# Patient Record
Sex: Male | Born: 1950 | Race: White | Hispanic: No | Smoking: Former smoker
Health system: Southern US, Community
[De-identification: ages and names within clinical notes are randomized; demographics above are authoritative.]

## PROBLEM LIST (undated history)

## (undated) DIAGNOSIS — E785 Hyperlipidemia, unspecified: Secondary | ICD-10-CM

## (undated) DIAGNOSIS — I7789 Other specified disorders of arteries and arterioles: Secondary | ICD-10-CM

## (undated) DIAGNOSIS — I10 Essential (primary) hypertension: Secondary | ICD-10-CM

## (undated) DIAGNOSIS — I77810 Thoracic aortic ectasia: Secondary | ICD-10-CM

## (undated) DIAGNOSIS — Z8249 Family history of ischemic heart disease and other diseases of the circulatory system: Secondary | ICD-10-CM

## (undated) DIAGNOSIS — I7121 Aneurysm of the ascending aorta, without rupture: Secondary | ICD-10-CM

## (undated) DIAGNOSIS — I712 Thoracic aortic aneurysm, without rupture: Secondary | ICD-10-CM

## (undated) HISTORY — DX: Aneurysm of the ascending aorta, without rupture: I71.21

## (undated) HISTORY — DX: Essential (primary) hypertension: I10

## (undated) HISTORY — DX: Hyperlipidemia, unspecified: E78.5

## (undated) HISTORY — PX: COLONOSCOPY: SHX174

## (undated) HISTORY — DX: Other specified disorders of arteries and arterioles: I77.89

## (undated) HISTORY — DX: Thoracic aortic ectasia: I77.810

## (undated) HISTORY — DX: Hemochromatosis, unspecified: E83.119

## (undated) HISTORY — DX: Thoracic aortic aneurysm, without rupture: I71.2

## (undated) HISTORY — DX: Family history of ischemic heart disease and other diseases of the circulatory system: Z82.49

---

## 2003-10-15 ENCOUNTER — Inpatient Hospital Stay (HOSPITAL_COMMUNITY): Admission: EM | Admit: 2003-10-15 | Discharge: 2003-10-16 | Payer: Self-pay | Admitting: Emergency Medicine

## 2014-01-14 ENCOUNTER — Other Ambulatory Visit: Payer: Self-pay | Admitting: Gastroenterology

## 2014-01-14 DIAGNOSIS — R1032 Left lower quadrant pain: Secondary | ICD-10-CM

## 2014-01-16 ENCOUNTER — Ambulatory Visit
Admission: RE | Admit: 2014-01-16 | Discharge: 2014-01-16 | Disposition: A | Discharge status: Home / Self Care | Payer: BC Managed Care – PPO | Source: Ambulatory Visit | Attending: Gastroenterology | Admitting: Gastroenterology

## 2014-01-16 ENCOUNTER — Encounter (INDEPENDENT_AMBULATORY_CARE_PROVIDER_SITE_OTHER): Payer: Self-pay

## 2014-01-16 DIAGNOSIS — R1032 Left lower quadrant pain: Secondary | ICD-10-CM

## 2014-01-16 MED ORDER — IOHEXOL 300 MG/ML  SOLN
100.0000 mL | Freq: Once | INTRAMUSCULAR | Status: AC | PRN
  Administered 2014-01-16: 100 mL via INTRAVENOUS

## 2014-02-19 ENCOUNTER — Other Ambulatory Visit (HOSPITAL_COMMUNITY): Payer: Self-pay

## 2014-03-11 ENCOUNTER — Other Ambulatory Visit: Payer: Self-pay | Admitting: General Surgery

## 2014-03-11 DIAGNOSIS — I77810 Thoracic aortic ectasia: Secondary | ICD-10-CM

## 2014-03-12 ENCOUNTER — Encounter: Payer: Self-pay | Admitting: Oncology

## 2014-03-12 ENCOUNTER — Ambulatory Visit (HOSPITAL_COMMUNITY): Payer: BC Managed Care – PPO | Attending: Cardiovascular Disease | Admitting: Cardiology

## 2014-03-12 ENCOUNTER — Ambulatory Visit (INDEPENDENT_AMBULATORY_CARE_PROVIDER_SITE_OTHER): Payer: BC Managed Care – PPO | Admitting: Oncology

## 2014-03-12 DIAGNOSIS — I517 Cardiomegaly: Secondary | ICD-10-CM | POA: Insufficient documentation

## 2014-03-12 DIAGNOSIS — I77810 Thoracic aortic ectasia: Secondary | ICD-10-CM

## 2014-03-12 DIAGNOSIS — I079 Rheumatic tricuspid valve disease, unspecified: Secondary | ICD-10-CM | POA: Insufficient documentation

## 2014-03-12 DIAGNOSIS — R161 Splenomegaly, not elsewhere classified: Secondary | ICD-10-CM

## 2014-03-12 HISTORY — DX: Hemochromatosis, unspecified: E83.119

## 2014-03-12 NOTE — Progress Notes (Signed)
Patient ID: Alexander Pollard, male   DOB: 01/08/1951, 63 y.o.   MRN: [id number] New Patient Hematology   TRAYVEON BECKFORD [id number] 1951/09/06 63 y.o. 03/12/2014  CC: Dr Keturah Barre. Dorthy Cooler; Dr. 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 At time of annual physical exam by his primary care physician, laboratory studies showed mild liver function abnormalities which led to further study including an iron panel done on 12/25/2013 which showed serum iron 264, percent saturation 97. Ferritin done on April 22:  1498. 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 No mutations in the other minor hemochromatosis genes. Bilirubin 1.1 (0.3-1.0, SGOT 40 (0-39), SGPT 78 (0-52), alkaline phosphatase 71. CT scan of the abdomen 01/16/2014 with a prominent spleen measuring 16 cm. Liver overall appeared normal. There were 3 "tiny low attenuation lesions superiorly too small to characterize. These may represent tiny cysts."  He has no history of diabetes, heart disease, prior known liver disease and specifically denies history of hepatitis, yellow jaundice, malaria, mononucleosis. No known gonadal dysfunction. 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 He drinks an average of 6 beers per week.  No one in his family has been tested or diagnosed with hemachromatosis. His mother died at age 3 of an MI. Father died of old age in his 16s. He had 6 brothers 4 are deceased. 4 of his brothers had coronary disease and had bypass surgery. One died in a motor vehicle accident. One died of complications of lymphoma. One died recently at age 44 of alcohol related cirrhosis. He has a daughter age 73 and a son age 70 who are  healthy.   PMH: Past Medical History  Diagnosis Date  . Hemochromatosis 03/12/2014    C282Y homozygote dx 01/02/14  Ferritin 1498  Iron saturation 97%  See history of present illness. No hypertension, diabetes, stomach ulcers, asthma, emphysema, no history of tuberculosis, no thyroid trouble, no history of seizure or stroke. No prostate problems.  No prior surgery  Allergies: Allergies  Allergen Reactions  . Effexor [Venlafaxine]     FATIGUE SIDE EFFECTS    Medications: No chronic medications    Social History:  See history of present illness. Nonsmoker. Occasional beer less than 6 per week  Family History: See history of present illness Family History  Problem Relation Age of Onset  . Heart attack Mother   . Heart disease Mother   . Leukemia Brother   . Lymphoma Brother   . Heart disease Brother     Review of Systems: Hematology: Positive easy bruising,  ENT ROS: negative for - oral lesions or sore throat Breast ROS:  Respiratory ROS: negative for - cough, pleuritic pain, shortness of breath or wheezing Cardiovascular ROS: negative for - chest pain, dyspnea on exertion, edema, irregular heartbeat, murmur,  palpitations,  or rapid heart rate Gastrointestinal ROS- he has had some intermittent left upper quadrant discomfort. No appetite loss, blood in stools, change in bowel habits, constipation, diarrhea, heartburn, hematemesis, melena, nausea/vomiting or swallowing difficulty/pain Genito-Urinary ROS: No prostate trouble Musculoskeletal ROS: negative for - joint pain, joint stiffness, joint swelling, muscle pain, muscular weakness or pain except for arthritis which has developed in his fingers over the last few years Neurological ROS: negative for headache, change in  vision, or paresthesias. No history of stroke or seizure. Dermatological ROS: Positive for ecchymosis/easy bruising Remaining ROS negative.  Physical Exam: Blood pressure 110/80, pulse 71, temperature 97.4  F (36.3 C), temperature source Oral, height 5\' 7"  (1.702 m), weight 188 lb 9.6 oz (85.548 kg), SpO2 99.00%. Wt Readings from Last 3 Encounters:  03/12/14 188 lb 9.6 oz (85.548 kg)     General appearance: Well-nourished Caucasian man HENNT: Pharynx no erythema, exudate, mass, or ulcer. No thyromegaly or thyroid nodules Lymph nodes: No cervical, supraclavicular, or axillary lymphadenopathy Breasts: Lungs: Clear to auscultation, resonant to percussion throughout Heart: Regular rhythm, no murmur, no gallop, no rub, no click, no edema Abdomen: Soft, nontender, normal bowel sounds, no mass, no organomegaly despite splenomegaly by CT scan criteria Extremities: No edema, no calf tenderness Musculoskeletal: no joint deformities GU: Not examined Vascular: Carotid pulses 2+, no bruits, distal pulses: Dorsalis pedis 1+ symmetric Neurologic: Alert, oriented, PERRLA, optic discs sharp and vessels normal, no hemorrhage or exudate, cranial nerves grossly normal, motor strength 5 over 5, reflexes 1+ symmetric, upper body coordination normal, gait normal, vibration sensation is normal by tuning fork exam over the fingertips Skin: Scattered small ecchymosis on the forearms. He is tanned from being at the beach.    Lab Results: No results found for this basename: WBC, HGB, HCT, MCV, PLT     Chemistry   No results found for this basename: NA, K, CL, CO2, BUN, CREATININE, GLU   No results found for this basename: CALCIUM, ALKPHOS, AST, ALT, BILITOT      Radiological Studies: See discussion above CT-scan of the abdomen 01/16/14 There are 3 tiny low-attenuation lesions in the liver superiorly  small too small to characterize, 2 in the left lobe and 1 in the  right. These may represent tiny cysts. The liver is otherwise  normal. Gallbladder is normal. Spleen is enlarged with a span of 16  cm. Pancreas is normal. Adrenal glands are normal. Kidneys are  normal.  Aorta shows mild calcification with no  dilatation. Bowel is normal.  Appendix is normal.  Bladder and reproductive organs are normal. There is no ascites.  There is no significant adenopathy. The largest lymph node present  is 10 mm, in the mesenteries on image number 40, with the majority  of lymph nodes measuring less than or equal to 6 mm in diameter.  Visualized portions of the lung bases are clear except for mild  dependent atelectasis. There are no acute musculoskeletal findings.  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  IMPRESSION:  Nonspecific splenomegaly. This can be seen with thalassemia or  sickle cell anemia, metabolic or storage diseases, leukemia, etc.  A few tiny liver lesions nonspecific, too small to characterize.  Electronically Signed  By: Skipper Cliche M.D.  On: 01/16/2014 15:55    Impression and Plan: #1. Homozygous C282Y Hemochromatosis  Diagnosis, prognosis, and treatment plan discussed in detail. We will need to initiate a phlebotomy program. His 2 surviving brothers and his 2 children should be checked. His brothers are likely homozygous as well.  Ferritin is over 1000 and therefore he is at risk for liver damage. He is spending the summer on [place] and is only here today for consultation and to see his cardiologist. We do have the availability of a new way to evaluate the liver for fibrosis: ultrasound elastography. Although this is certainly not as sensitive as a liver biopsy, clinical management will not change whether or not we do a liver  biopsy. I'm going to have him get the ultrasound when he returns to [facility] in September as a baseline. I have instructed him to explore regional blood drives and encouraged him to donate a unit of blood every 2 weeks until his next visit here in the fall.  #2. Incidental mild splenomegaly noted on CT No suspicion of advanced cirrhosis therefore passive congestion of the spleen unlikely. No lymphadenopathy on exam. I feel observation alone  most sensible at this time. Followup CT scan in one year.        [name], MD 03/12/2014, 12:13 PM

## 2014-03-12 NOTE — Patient Instructions (Signed)
Lab and ultrasound of liver Sept 14th Visit with Dr Darnell Level 9/21 10:15

## 2014-03-12 NOTE — Progress Notes (Signed)
Echo performed. 

## 2014-03-14 ENCOUNTER — Other Ambulatory Visit: Payer: Self-pay | Admitting: General Surgery

## 2014-03-14 DIAGNOSIS — I77810 Thoracic aortic ectasia: Secondary | ICD-10-CM

## 2014-05-10 ENCOUNTER — Encounter: Payer: Self-pay | Admitting: General Surgery

## 2014-05-10 DIAGNOSIS — Z8249 Family history of ischemic heart disease and other diseases of the circulatory system: Secondary | ICD-10-CM | POA: Insufficient documentation

## 2014-05-28 ENCOUNTER — Ambulatory Visit: Payer: BC Managed Care – PPO | Admitting: Cardiology

## 2014-05-28 ENCOUNTER — Ambulatory Visit (INDEPENDENT_AMBULATORY_CARE_PROVIDER_SITE_OTHER): Payer: BC Managed Care – PPO | Admitting: Cardiology

## 2014-05-28 ENCOUNTER — Ambulatory Visit (HOSPITAL_COMMUNITY)
Admission: RE | Admit: 2014-05-28 | Discharge: 2014-05-28 | Disposition: A | Discharge status: Home / Self Care | Payer: BC Managed Care – PPO | Source: Ambulatory Visit | Attending: Oncology | Admitting: Oncology

## 2014-05-28 ENCOUNTER — Encounter: Payer: Self-pay | Admitting: Cardiology

## 2014-05-28 VITALS — BP 118/89 | HR 72 | Ht 68.0 in | Wt 189.0 lb

## 2014-05-28 DIAGNOSIS — E785 Hyperlipidemia, unspecified: Secondary | ICD-10-CM | POA: Insufficient documentation

## 2014-05-28 DIAGNOSIS — I77810 Thoracic aortic ectasia: Secondary | ICD-10-CM | POA: Insufficient documentation

## 2014-05-28 DIAGNOSIS — R161 Splenomegaly, not elsewhere classified: Secondary | ICD-10-CM | POA: Diagnosis not present

## 2014-05-28 DIAGNOSIS — K7689 Other specified diseases of liver: Secondary | ICD-10-CM | POA: Diagnosis not present

## 2014-05-28 DIAGNOSIS — Z8249 Family history of ischemic heart disease and other diseases of the circulatory system: Secondary | ICD-10-CM

## 2014-05-28 NOTE — Patient Instructions (Signed)
Your physician recommends that you continue on your current medications as directed. Please refer to the Current Medication list given to you today.  Your physician has requested that you have an echocardiogram. Echocardiography is a painless test that uses sound waves to create images of your heart. It provides your doctor with information about the size and shape of your heart and how well your heart's chambers and valves are working. This procedure takes approximately one hour. There are no restrictions for this procedure.  Your physician wants you to follow-up in: 1 Year with Dr Turner You will receive a reminder letter in the mail two months in advance. If you don't receive a letter, please call our office to schedule the follow-up appointment.  

## 2014-05-28 NOTE — Progress Notes (Signed)
  [address] [place], [place]  [id number] Phone: [phone] Fax:  [phone]  Date:  05/28/2014   ID:  YAW ESCOTO, DOB [birth year], MRN [id number]  PCP:  Sueanne Margarita, MD  Cardiologist:  Fransico Him MD     History of Present Illness: JOHANNA STAFFORD is a 63 y.o. male with a history of mildy dilated aortic root, diastolic dysfunction and family history of CAD who presents today for followup.  He is doing well.  He denies any chest pain, SOB, DOE, LE edema, dizziness, palpitations or syncope.  He lifts weight and runs 25 miles weekly and bikes for exercise.   Wt Readings from Last 3 Encounters:  05/28/14 189 lb (85.73 kg)  03/12/14 188 lb 9.6 oz (85.548 kg)     Past Medical History  Diagnosis Date  . Hemochromatosis 03/12/2014    C282Y homozygote dx 01/02/14  Ferritin 1498  Iron saturation 97%  . Low HDL (under 40)     Nl LDL and TG's  . Family history of early CAD     No current outpatient prescriptions on file.   No current facility-administered medications for this visit.    Allergies:    Allergies  Allergen Reactions  . Effexor [Venlafaxine]     FATIGUE SIDE EFFECTS    Social History:  The patient  reports that he has quit smoking. He does not have any smokeless tobacco history on file. He reports that he drinks alcohol. He reports that he does not use illicit drugs.   Family History:  The patient's family history includes Heart attack in his mother; Heart disease in his brother and mother; Leukemia in his brother; Lymphoma in his brother.   ROS:  Please see the history of present illness.      All other systems reviewed and negative.   PHYSICAL EXAM: VS:  BP 118/89  Pulse 72  Ht 5\' 8"  (1.727 m)  Wt 189 lb (85.73 kg)  BMI 28.74 kg/m2 Well nourished, well developed, in no acute distress HEENT: normal Neck: no JVD Cardiac:  normal S1, S2; RRR; no murmur Lungs:  clear to auscultation bilaterally, no wheezing, rhonchi or rales Abd: soft,  nontender, no hepatomegaly Ext: no edema Skin: warm and dry Neuro:  CNs 2-12 intact, no focal abnormalities noted  EKG:   NSR with no ST changes  ASSESSMENT AND PLAN:  1. Family history of CAD 2. Dyslipidemia with LDL at goal at 67 (12/2013).  HDL low at 31 - encouraged to continue exercise 3. Dilated aortic root - recheck 2D echo to assess for progression  Followup with me in 1 year  Signed, Fransico Him, MD 05/28/2014 8:36 AM

## 2014-06-04 ENCOUNTER — Telehealth: Payer: Self-pay | Admitting: *Deleted

## 2014-06-04 NOTE — Telephone Encounter (Signed)
Message left per pt - requesting Ultrasound result. Telephone # [phone]. Thanks

## 2014-06-04 NOTE — Telephone Encounter (Signed)
I called these results to his cell phone yesterday!

## 2014-06-05 ENCOUNTER — Telehealth: Payer: Self-pay | Admitting: *Deleted

## 2014-06-05 ENCOUNTER — Other Ambulatory Visit: Payer: Self-pay | Admitting: Oncology

## 2014-06-05 ENCOUNTER — Other Ambulatory Visit (HOSPITAL_COMMUNITY): Payer: Self-pay | Admitting: *Deleted

## 2014-06-05 ENCOUNTER — Other Ambulatory Visit (HOSPITAL_COMMUNITY): Payer: BC Managed Care – PPO

## 2014-06-05 NOTE — Telephone Encounter (Signed)
Pt called/Informed of appt for his phlebotomy on Tuesday 9/29 @ 0900AM; pt informed to register at Admissions around 0830AM. Pt voiced understanding.

## 2014-06-11 ENCOUNTER — Encounter (HOSPITAL_COMMUNITY)
Admission: RE | Admit: 2014-06-11 | Discharge: 2014-06-11 | Disposition: A | Discharge status: Home / Self Care | Payer: BC Managed Care – PPO | Source: Ambulatory Visit | Attending: Oncology | Admitting: Oncology

## 2014-06-11 LAB — POCT HEMOGLOBIN-HEMACUE: HEMOGLOBIN: 14.7 g/dL (ref 13.0–17.0)

## 2014-06-11 NOTE — Progress Notes (Signed)
Hemoglobin 14.7 today.  Removed 500cc of blood and pt tolerated procedure well.

## 2014-06-25 ENCOUNTER — Encounter (HOSPITAL_COMMUNITY)
Admission: RE | Admit: 2014-06-25 | Discharge: 2014-06-25 | Disposition: A | Discharge status: Home / Self Care | Payer: BC Managed Care – PPO | Source: Ambulatory Visit | Attending: Oncology | Admitting: Oncology

## 2014-06-25 LAB — POCT HEMOGLOBIN-HEMACUE: Hemoglobin: 15.1 g/dL (ref 13.0–17.0)

## 2014-06-25 NOTE — Progress Notes (Signed)
Hgb 15.1.  Performed therapeutic phlebotomy via left AC and removed 500cc of blood waste.  Patient tolerated procedure well.

## 2014-07-08 ENCOUNTER — Other Ambulatory Visit (HOSPITAL_COMMUNITY): Payer: Self-pay | Admitting: *Deleted

## 2014-07-09 ENCOUNTER — Encounter (HOSPITAL_COMMUNITY)
Admission: RE | Admit: 2014-07-09 | Discharge: 2014-07-09 | Disposition: A | Discharge status: Home / Self Care | Payer: BC Managed Care – PPO | Source: Ambulatory Visit | Attending: Oncology | Admitting: Oncology

## 2014-07-09 ENCOUNTER — Ambulatory Visit (INDEPENDENT_AMBULATORY_CARE_PROVIDER_SITE_OTHER): Payer: BC Managed Care – PPO | Admitting: Oncology

## 2014-07-09 ENCOUNTER — Encounter: Payer: Self-pay | Admitting: Oncology

## 2014-07-09 LAB — COMPREHENSIVE METABOLIC PANEL
ALBUMIN: 3.9 g/dL (ref 3.5–5.2)
ALK PHOS: 85 U/L (ref 39–117)
ALT: 60 U/L — ABNORMAL HIGH (ref 0–53)
ANION GAP: 11 (ref 5–15)
AST: 43 U/L — ABNORMAL HIGH (ref 0–37)
BUN: 12 mg/dL (ref 6–23)
CO2: 25 mEq/L (ref 19–32)
CREATININE: 0.87 mg/dL (ref 0.50–1.35)
Calcium: 9.1 mg/dL (ref 8.4–10.5)
Chloride: 103 mEq/L (ref 96–112)
GFR calc non Af Amer: 90 mL/min — ABNORMAL LOW (ref >90)
GLUCOSE: 108 mg/dL — AB (ref 70–99)
POTASSIUM: 4.6 meq/L (ref 3.7–5.3)
Sodium: 139 mEq/L (ref 137–147)
TOTAL PROTEIN: 6.8 g/dL (ref 6.0–8.3)
Total Bilirubin: 1.3 mg/dL — ABNORMAL HIGH (ref 0.3–1.2)

## 2014-07-09 LAB — CBC WITH DIFFERENTIAL/PLATELET
Basophils Absolute: 0 10*3/uL (ref 0.0–0.1)
Basophils Relative: 0 % (ref 0–1)
EOS ABS: 0.2 10*3/uL (ref 0.0–0.7)
Eosinophils Relative: 4 % (ref 0–5)
HEMATOCRIT: 42.3 % (ref 39.0–52.0)
Hemoglobin: 15.1 g/dL (ref 13.0–17.0)
Lymphocytes Relative: 27 % (ref 12–46)
Lymphs Abs: 1.6 10*3/uL (ref 0.7–4.0)
MCH: 33.2 pg (ref 26.0–34.0)
MCHC: 35.7 g/dL (ref 30.0–36.0)
MCV: 93 fL (ref 78.0–100.0)
MONOS PCT: 7 % (ref 3–12)
Monocytes Absolute: 0.4 10*3/uL (ref 0.1–1.0)
NEUTROS PCT: 62 % (ref 43–77)
Neutro Abs: 3.5 10*3/uL (ref 1.7–7.7)
Platelets: 157 10*3/uL (ref 150–400)
RBC: 4.55 MIL/uL (ref 4.22–5.81)
RDW: 13.9 % (ref 11.5–15.5)
WBC: 5.7 10*3/uL (ref 4.0–10.5)

## 2014-07-09 LAB — POCT HEMOGLOBIN-HEMACUE: Hemoglobin: 15.3 g/dL (ref 13.0–17.0)

## 2014-07-09 LAB — FERRITIN: Ferritin: 1392 ng/mL — ABNORMAL HIGH (ref 22–322)

## 2014-07-09 NOTE — Progress Notes (Signed)
Labs were drawn per md order and per protocol.  Hemocue was 15.3  500cc of blood was removed.  Right AC was used.  Pt tolerated procedure well.  Will continue to monitor

## 2014-07-09 NOTE — Patient Instructions (Signed)
Continue phlebotomy every 2 weeks for now   Return visit with Dr Darnell Level in 8-10 weeks

## 2014-07-09 NOTE — Progress Notes (Signed)
Patient ID: Alexander Pollard, male   DOB: 09-09-1951, 63 y.o.   MRN: [id number] Followup visit for this 63 year old man with recently diagnosed homozygous C282Y Hemochromatosis when routine laboratory evaluation revealed mild transaminase elevations. Subsequent study showed elevated serum iron 264, percent saturation 97, and ferritin 1498. There is no prior family history. I initially saw him on June 30. He wanted to wait until he got back from summer vacation to begin a phlebotomy program. On his return, I obtained an abdominal ultrasound with elastography. No focal parenchymal liver abnormalities. Several small subcentimeter cysts were noted. There was increased parenchymal echogenicity which was felt to be within normal limits in a pattern consistent with hepatic steatosis. Spleen normal at 13 cm. Fibrosis score was level II with some areas of level III read as moderate risk for fibrosis.  He began a phlebotomy program on September 29 and is currently on an every two-week schedule. He will get his third phlebotomy today.  He has no new symptoms.  Exam: Blood pressure 122/74, pulse 75, temperature 98.3 F (36.8 C), temperature source Oral, height 5\' 7"  (1.702 m), weight 190 lb 12.8 oz (86.546 kg), SpO2 99.00%. Lungs are clear. Regular cardiac rhythm no murmur. Abdomen soft, nontender, no mass, no organomegaly Extremities no edema tenderness  Lab pending  Impression: Homozygous hemachromatosis  Probable early liver fibrosis based on ultrasound elastography  Plan: Continue to q. 2 weekly phlebotomy until ferritin in a target range of less than 150 then decrease frequency of procedures. New orders put in to continue the phlebotomy program. I will go ahead and check a ferritin and a liver profile today and periodically going into the future. Anticipate followup ultrasound elastography in 6-12 months.

## 2014-07-10 ENCOUNTER — Encounter (HOSPITAL_COMMUNITY): Payer: BC Managed Care – PPO

## 2014-07-10 ENCOUNTER — Telehealth: Payer: Self-pay | Admitting: *Deleted

## 2014-07-10 NOTE — Telephone Encounter (Signed)
Message copied by Ebbie Latus on Wed Jul 10, 2014 11:23 AM ------      Message from: [name]      Created: Tue Jul 09, 2014  5:15 PM       Call pt ferritin 1392 - not unexpected after just 2 phlebotomies - at least heading in the right direction. Liver tests stable, mildly elevated ------

## 2014-07-10 NOTE — Telephone Encounter (Signed)
Pt called /informed Ferritin 1392 which " not unexpected after just 2 phlebotomies-at least heading in the right direction" Also liver tests stable, mildly elveated per Dr Beryle Beams. Pt stated he had looked at his labs and was concerned about the Ferritin and was glad I called.

## 2014-07-23 ENCOUNTER — Inpatient Hospital Stay (HOSPITAL_COMMUNITY): Admission: RE | Admit: 2014-07-23 | Payer: BC Managed Care – PPO | Source: Ambulatory Visit

## 2014-07-24 ENCOUNTER — Ambulatory Visit (HOSPITAL_COMMUNITY)
Admission: RE | Admit: 2014-07-24 | Discharge: 2014-07-24 | Disposition: A | Discharge status: Home / Self Care | Payer: BC Managed Care – PPO | Source: Ambulatory Visit | Attending: Oncology | Admitting: Oncology

## 2014-07-24 LAB — POCT HEMOGLOBIN-HEMACUE: Hemoglobin: 15.7 g/dL (ref 13.0–17.0)

## 2014-07-24 NOTE — Progress Notes (Signed)
Hgb 15.7.  Left AC was used to perform therapeutic phlebotomy.  500cc of blood waste removed.  Patient tolerated well.  Will continue to monitor.

## 2014-08-07 ENCOUNTER — Encounter (HOSPITAL_COMMUNITY)
Admission: RE | Admit: 2014-08-07 | Discharge: 2014-08-07 | Disposition: A | Discharge status: Home / Self Care | Payer: BC Managed Care – PPO | Source: Ambulatory Visit | Attending: Oncology | Admitting: Oncology

## 2014-08-07 LAB — POCT HEMOGLOBIN-HEMACUE: HEMOGLOBIN: 15.8 g/dL (ref 13.0–17.0)

## 2014-08-07 NOTE — Progress Notes (Signed)
Hgb 15.8. Right AC was used to perform therapeutic phlebotomy.  500cc of blood waste removed.  Patient tolerated well.  Will continue to monitor.

## 2014-08-21 ENCOUNTER — Ambulatory Visit (HOSPITAL_COMMUNITY)
Admission: RE | Admit: 2014-08-21 | Discharge: 2014-08-21 | Disposition: A | Discharge status: Home / Self Care | Payer: BC Managed Care – PPO | Source: Ambulatory Visit | Attending: Oncology | Admitting: Oncology

## 2014-08-21 DIAGNOSIS — Z538 Procedure and treatment not carried out for other reasons: Secondary | ICD-10-CM | POA: Diagnosis present

## 2014-08-21 LAB — POCT HEMOGLOBIN-HEMACUE: Hemoglobin: 12.6 g/dL — ABNORMAL LOW (ref 13.0–17.0)

## 2014-08-21 NOTE — Progress Notes (Signed)
Hgb 12.6 via hemocue.  Phlebotomy not performed due to order parameters.

## 2014-08-27 ENCOUNTER — Encounter: Payer: Self-pay | Admitting: Oncology

## 2014-08-27 ENCOUNTER — Ambulatory Visit (INDEPENDENT_AMBULATORY_CARE_PROVIDER_SITE_OTHER): Payer: BC Managed Care – PPO | Admitting: Oncology

## 2014-08-27 LAB — CBC WITH DIFFERENTIAL/PLATELET
Basophils Absolute: 0.1 10*3/uL (ref 0.0–0.1)
Basophils Relative: 1 % (ref 0–1)
EOS ABS: 0.2 10*3/uL (ref 0.0–0.7)
EOS PCT: 4 % (ref 0–5)
HCT: 42.6 % (ref 39.0–52.0)
Hemoglobin: 15 g/dL (ref 13.0–17.0)
LYMPHS PCT: 24 % (ref 12–46)
Lymphs Abs: 1.5 10*3/uL (ref 0.7–4.0)
MCH: 33 pg (ref 26.0–34.0)
MCHC: 35.2 g/dL (ref 30.0–36.0)
MCV: 93.6 fL (ref 78.0–100.0)
MONO ABS: 0.5 10*3/uL (ref 0.1–1.0)
MPV: 9.9 fL (ref 9.4–12.4)
Monocytes Relative: 8 % (ref 3–12)
Neutro Abs: 3.8 10*3/uL (ref 1.7–7.7)
Neutrophils Relative %: 63 % (ref 43–77)
PLATELETS: 167 10*3/uL (ref 150–400)
RBC: 4.55 MIL/uL (ref 4.22–5.81)
RDW: 14.1 % (ref 11.5–15.5)
WBC: 6.1 10*3/uL (ref 4.0–10.5)

## 2014-08-27 LAB — COMPREHENSIVE METABOLIC PANEL
ALBUMIN: 4.1 g/dL (ref 3.5–5.2)
ALT: 51 U/L (ref 0–53)
AST: 37 U/L (ref 0–37)
Alkaline Phosphatase: 98 U/L (ref 39–117)
BUN: 16 mg/dL (ref 6–23)
CALCIUM: 9 mg/dL (ref 8.4–10.5)
CHLORIDE: 108 meq/L (ref 96–112)
CO2: 24 mEq/L (ref 19–32)
CREATININE: 0.89 mg/dL (ref 0.50–1.35)
GLUCOSE: 118 mg/dL — AB (ref 70–99)
Potassium: 4.6 mEq/L (ref 3.5–5.3)
Sodium: 140 mEq/L (ref 135–145)
Total Bilirubin: 0.8 mg/dL (ref 0.2–1.2)
Total Protein: 6.3 g/dL (ref 6.0–8.3)

## 2014-08-27 LAB — FERRITIN: Ferritin: 1404 ng/mL — ABNORMAL HIGH (ref 22–322)

## 2014-08-27 NOTE — Progress Notes (Signed)
Patient ID: Alexander Pollard, male   DOB: 1950/10/27, 63 y.o.   MRN: [id number] Hematology and Oncology Follow Up Visit  Alexander Pollard [id number] [birth year] 63 y.o. 08/27/2014 7:00 PM   Principle Diagnosis: Encounter Diagnosis  Name Primary?  . Hemochromatosis Yes     Interim History:  Follow-up visit for this pleasant 63 year old man recently diagnosed with homozygous C282Y hemochromatosis in April of this year. Further evaluation of mild liver function abnormalities revealed significant elevation of serum iron 264, percent saturation 97, and ferritin 1498. CT scan of the abdomen done 01/16/2014 showed overall normal appearance of the liver. Prominent spleen 16 cm. Subsequent ultrasound with elastography done on September 15 showed a normal-size spleen 13 cm. Several small cysts in the liver but no parenchymal mass. Fibrosis score F2 with some areas F3. He was started on a phlebotomy program on 06/11/2014. He has had 6 , 500 mL phlebotomies at two-week intervals most recent November 25. He has tolerated the procedures well. Repeat ferritin drawn today results pending. He remains active. He has had no interim medical problems. He voices no complaints today.  Medications: reviewed  Allergies:  Allergies  Allergen Reactions  . Effexor [Venlafaxine]     FATIGUE SIDE EFFECTS    Review of Systems: ROS negative:   Physical Exam: Blood pressure 118/75, pulse 79, temperature 98 F (36.7 C), temperature source Oral, height 5\' 7"  (1.702 m), weight 190 lb 12.8 oz (86.546 kg), SpO2 99 %. Wt Readings from Last 3 Encounters:  08/27/14 190 lb 12.8 oz (86.546 kg)  08/21/14 180 lb (81.647 kg)  08/07/14 181 lb (82.101 kg)     General appearance: Well-nourished man looks younger than stated age HENNT: Pharynx no erythema, exudate, mass, or ulcer. No thyromegaly or thyroid nodules Lymph nodes: No cervical, supraclavicular, or axillary lymphadenopathy Breasts:  Lungs: Clear to auscultation,  resonant to percussion throughout Heart: Regular rhythm, no murmur, no gallop, no rub, no click, no edema Abdomen: Soft, nontender, normal bowel sounds, no mass, no organomegaly Extremities: No edema, no calf tenderness Musculoskeletal: no joint deformities GU:  Vascular: Carotid pulses 2+, no bruits,  Neurologic: Alert, oriented, PERRLA, , cranial nerves grossly normal, motor strength 5 over 5, reflexes 1+ symmetric, upper body coordination normal, gait normal, Skin: No rash or ecchymosis  Lab Results: CBC W/Diff    Component Value Date/Time   WBC 5.7 07/09/2014 1100   RBC 4.55 07/09/2014 1100   HGB 12.6* 08/21/2014 1314   HCT 42.3 07/09/2014 1100   PLT 157 07/09/2014 1100   MCV 93.0 07/09/2014 1100   MCH 33.2 07/09/2014 1100   MCHC 35.7 07/09/2014 1100   RDW 13.9 07/09/2014 1100   LYMPHSABS 1.6 07/09/2014 1100   MONOABS 0.4 07/09/2014 1100   EOSABS 0.2 07/09/2014 1100   BASOSABS 0.0 07/09/2014 1100     Chemistry      Component Value Date/Time   NA 139 07/09/2014 1100   K 4.6 07/09/2014 1100   CL 103 07/09/2014 1100   CO2 25 07/09/2014 1100   BUN 12 07/09/2014 1100   CREATININE 0.87 07/09/2014 1100      Component Value Date/Time   CALCIUM 9.1 07/09/2014 1100   ALKPHOS 85 07/09/2014 1100   AST 43* 07/09/2014 1100   ALT 60* 07/09/2014 1100   BILITOT 1.3* 07/09/2014 1100       Radiological Studies: No results found.  Impression:  Homozygous hemachromatosis gene carrier Plan: Continue phlebotomy program. Hemoglobin now down to 12.6. I will decrease phlebotomy  to every month and make further adjustments in the future as indicated by his ferritin and hemoglobin levels.   CC: Patient Care Team: Sueanne Margarita, MD as PCP - General (Cardiology) [name], MD as Consulting Physician (Oncology) Lear Ng, MD as Consulting Physician (Gastroenterology) Sueanne Margarita, MD as Consulting Physician (Cardiology)   [name],  MD 12/15/20157:00 PM

## 2014-08-27 NOTE — Patient Instructions (Signed)
To lab today We will call you with results Change to monthly phlebotomy beginning on 09/18/14 Return vist to see Dr Darnell Level in 4 months Lab 1 week before visit

## 2014-08-28 ENCOUNTER — Other Ambulatory Visit: Payer: Self-pay | Admitting: Oncology

## 2014-08-30 ENCOUNTER — Telehealth: Payer: Self-pay | Admitting: *Deleted

## 2014-08-30 NOTE — Telephone Encounter (Signed)
-----   Message from [name], MD sent at 08/28/2014 10:59 AM EST ----- Call pt: ferritin not changed from previous value:  We need to do weekly phlebotomy for next 6 weeks then re-evaluate.  His true hemoglobin was 15 not 12.6: finger stick value done at short stay was falsely low. We will need to advise short stay we need weekly phlebotomy and I will need to put in new orders

## 2014-08-30 NOTE — Telephone Encounter (Signed)
Pt called x 2 - no answer. Left message his true Hgb was 15 not 12.6; finger stick value done at short stay was incorrect. And need to do weekly phlebotomies x 6 weeks then re-evaluate. But ferritin was Not changed from previous value per Dr Beryle Beams. Also informed him to schedule his next appt prior leaving after his first phlebotomy.  Talked to [place] at Short Stay, made awared of new order.

## 2014-09-18 ENCOUNTER — Encounter (HOSPITAL_COMMUNITY)
Admission: RE | Admit: 2014-09-18 | Discharge: 2014-09-18 | Disposition: A | Discharge status: Home / Self Care | Payer: BC Managed Care – PPO | Source: Ambulatory Visit | Attending: Oncology | Admitting: Oncology

## 2014-09-18 LAB — HEMOGLOBIN AND HEMATOCRIT, BLOOD
HCT: 42.9 % (ref 39.0–52.0)
HEMOGLOBIN: 15.4 g/dL (ref 13.0–17.0)

## 2014-09-18 NOTE — Progress Notes (Signed)
hgb 15.4 today,  Phlebotomized 500cc from left AC and pt tolerated procedure well.

## 2014-09-20 ENCOUNTER — Encounter: Payer: Self-pay | Admitting: Oncology

## 2014-09-26 ENCOUNTER — Encounter (HOSPITAL_COMMUNITY)
Admission: RE | Admit: 2014-09-26 | Discharge: 2014-09-26 | Disposition: A | Discharge status: Home / Self Care | Payer: BC Managed Care – PPO | Source: Ambulatory Visit | Attending: Oncology | Admitting: Oncology

## 2014-09-26 LAB — HEMOGLOBIN AND HEMATOCRIT, BLOOD
HEMATOCRIT: 39.6 % (ref 39.0–52.0)
Hemoglobin: 14.5 g/dL (ref 13.0–17.0)

## 2014-09-26 NOTE — Progress Notes (Signed)
hgb 14.5 today.  Phlebotomized 500cc from pt's right AC.  Pt tolerated procedure well

## 2014-10-04 ENCOUNTER — Inpatient Hospital Stay (HOSPITAL_COMMUNITY): Admission: RE | Admit: 2014-10-04 | Payer: BC Managed Care – PPO | Source: Ambulatory Visit

## 2014-10-09 ENCOUNTER — Encounter (HOSPITAL_COMMUNITY)
Admission: RE | Admit: 2014-10-09 | Discharge: 2014-10-09 | Disposition: A | Discharge status: Home / Self Care | Payer: BC Managed Care – PPO | Source: Ambulatory Visit | Attending: Oncology | Admitting: Oncology

## 2014-10-09 LAB — HEMOGLOBIN AND HEMATOCRIT, BLOOD
HEMATOCRIT: 40.8 % (ref 39.0–52.0)
Hemoglobin: 15 g/dL (ref 13.0–17.0)

## 2014-10-09 NOTE — Progress Notes (Signed)
theraputic phlebotomy was performed today.  HGB was 15.0  Per protocol 500cc of blood was removed from right AC.  Pt tolerated procedure well.  Will continue to monitor for 15 minutes.

## 2014-10-16 ENCOUNTER — Encounter (HOSPITAL_COMMUNITY)
Admission: RE | Admit: 2014-10-16 | Discharge: 2014-10-16 | Disposition: A | Discharge status: Home / Self Care | Payer: BC Managed Care – PPO | Source: Ambulatory Visit | Attending: Oncology | Admitting: Oncology

## 2014-10-16 LAB — HEMOGLOBIN AND HEMATOCRIT, BLOOD
HCT: 39.1 % (ref 39.0–52.0)
HEMOGLOBIN: 14.2 g/dL (ref 13.0–17.0)

## 2014-10-16 NOTE — Progress Notes (Addendum)
500 cc phlebotomy left antecubital per protocol without incident.  Patient tolerated well.  Taking po fluids

## 2014-10-23 ENCOUNTER — Encounter (HOSPITAL_COMMUNITY)
Admission: RE | Admit: 2014-10-23 | Discharge: 2014-10-23 | Disposition: A | Discharge status: Home / Self Care | Payer: BC Managed Care – PPO | Source: Ambulatory Visit | Attending: Oncology | Admitting: Oncology

## 2014-10-23 LAB — HEMOGLOBIN AND HEMATOCRIT, BLOOD
HEMATOCRIT: 46 % (ref 39.0–52.0)
HEMOGLOBIN: 15.5 g/dL (ref 13.0–17.0)

## 2014-10-23 NOTE — Progress Notes (Signed)
250 cc phlebotomy right antecubital.  Slow running and finally stopped at 250.  Patient tolerated well. Taking po fluids Final 250 cc phlebotomy left antecubital for total of 500 cc phlebotomy

## 2014-10-30 ENCOUNTER — Encounter (HOSPITAL_COMMUNITY)
Admission: RE | Admit: 2014-10-30 | Discharge: 2014-10-30 | Disposition: A | Discharge status: Home / Self Care | Payer: BC Managed Care – PPO | Source: Ambulatory Visit | Attending: Oncology | Admitting: Oncology

## 2014-10-30 LAB — HEMOGLOBIN AND HEMATOCRIT, BLOOD
HEMATOCRIT: 38.2 % — AB (ref 39.0–52.0)
Hemoglobin: 13.6 g/dL (ref 13.0–17.0)

## 2014-12-16 ENCOUNTER — Other Ambulatory Visit (INDEPENDENT_AMBULATORY_CARE_PROVIDER_SITE_OTHER): Payer: BC Managed Care – PPO

## 2014-12-16 LAB — COMPREHENSIVE METABOLIC PANEL
ALT: 76 U/L — ABNORMAL HIGH (ref 0–53)
AST: 49 U/L — ABNORMAL HIGH (ref 0–37)
Albumin: 4.1 g/dL (ref 3.5–5.2)
Alkaline Phosphatase: 73 U/L (ref 39–117)
BUN: 16 mg/dL (ref 6–23)
CO2: 26 mEq/L (ref 19–32)
Calcium: 9 mg/dL (ref 8.4–10.5)
Chloride: 104 mEq/L (ref 96–112)
Creat: 0.97 mg/dL (ref 0.50–1.35)
Glucose, Bld: 87 mg/dL (ref 70–99)
Potassium: 4.5 mEq/L (ref 3.5–5.3)
SODIUM: 139 meq/L (ref 135–145)
TOTAL PROTEIN: 6.7 g/dL (ref 6.0–8.3)
Total Bilirubin: 1.3 mg/dL — ABNORMAL HIGH (ref 0.2–1.2)

## 2014-12-16 LAB — LACTATE DEHYDROGENASE: LDH: 185 U/L (ref 94–250)

## 2014-12-16 LAB — CBC WITH DIFFERENTIAL/PLATELET
BASOS ABS: 0 10*3/uL (ref 0.0–0.1)
Basophils Relative: 0 % (ref 0–1)
EOS PCT: 5 % (ref 0–5)
Eosinophils Absolute: 0.3 10*3/uL (ref 0.0–0.7)
HCT: 45.7 % (ref 39.0–52.0)
HEMOGLOBIN: 16 g/dL (ref 13.0–17.0)
LYMPHS ABS: 1.5 10*3/uL (ref 0.7–4.0)
Lymphocytes Relative: 24 % (ref 12–46)
MCH: 32.5 pg (ref 26.0–34.0)
MCHC: 35 g/dL (ref 30.0–36.0)
MCV: 92.9 fL (ref 78.0–100.0)
MPV: 10.2 fL (ref 8.6–12.4)
Monocytes Absolute: 0.5 10*3/uL (ref 0.1–1.0)
Monocytes Relative: 8 % (ref 3–12)
NEUTROS PCT: 63 % (ref 43–77)
Neutro Abs: 4 10*3/uL (ref 1.7–7.7)
PLATELETS: 159 10*3/uL (ref 150–400)
RBC: 4.92 MIL/uL (ref 4.22–5.81)
RDW: 13.5 % (ref 11.5–15.5)
WBC: 6.4 10*3/uL (ref 4.0–10.5)

## 2014-12-16 LAB — FERRITIN: Ferritin: 1354 ng/mL — ABNORMAL HIGH (ref 22–322)

## 2014-12-17 ENCOUNTER — Other Ambulatory Visit: Payer: Self-pay | Admitting: Oncology

## 2014-12-19 ENCOUNTER — Telehealth: Payer: Self-pay | Admitting: *Deleted

## 2014-12-19 NOTE — Telephone Encounter (Signed)
Called pt - no answer. Left message this morning that his Hgb is up to 16 and need to resume monthly phlebotomies per Dr Beryle Beams. Instructed pt to call Laverne at [facility] or call me back to schedule his appt.  Per [facility], pt had scheduled his 1st appt on 4/14 @ 0900AM.

## 2014-12-19 NOTE — Telephone Encounter (Signed)
-----   Message from [name], MD sent at 12/17/2014  6:01 PM EDT ----- Call pt  Hb up to 16  We can  resume monthly phlebotomy 1st available at [facility] short stay  I put in orders. Alexander Pollard please call to get him an appt

## 2014-12-23 ENCOUNTER — Encounter: Payer: Self-pay | Admitting: Oncology

## 2014-12-23 ENCOUNTER — Ambulatory Visit (INDEPENDENT_AMBULATORY_CARE_PROVIDER_SITE_OTHER): Payer: BC Managed Care – PPO | Admitting: Oncology

## 2014-12-23 NOTE — Progress Notes (Signed)
Patient ID: Alexander Pollard, male   DOB: 26-Feb-1951, 64 y.o.   MRN: [id number] Hematology and Oncology Follow Up Visit  Alexander Pollard [id number] Jun 14, 1951 64 y.o. 12/23/2014 11:37 AM   Principle Diagnosis: Encounter Diagnosis  Name Primary?  . 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 Further evaluation of mild liver function abnormalities revealed significant elevation of serum iron 264, percent saturation 97, and ferritin 1498. CT scan of the abdomen done 01/16/2014 showed overall normal appearance of the liver. Prominent spleen 16 cm. Subsequent ultrasound with elastography done on September 15 showed a normal-size spleen 13 cm. Several small cysts in the liver but no parenchymal mass. Fibrosis score F2 with some areas F3. He was started on a phlebotomy program on 06/11/2014. He  had 11 , 500 mL phlebotomies at two-week intervals through 10/16/14 but ferritin did not come down and he was started on a weekly phlebotomy program beginning on February 10 and received phlebotomy on February 10 and February 17.  Orders expired and he has not had a phlebotomy since February 17. Repeat ferritin level in anticipation of today's visit done on April 4 showed no improvement compared with his baseline with current ferritin 1354 compared with 1392 back in October 2015 Liver functions remain abnormal with bilirubin 1.3, SGOT 49, SGPT 76.  Interim History:  He remains asymptomatic. He has had no interim medical problems.  Medications: No current outpatient prescriptions on file.  Allergies: No Active Allergies  Review of Systems: See HPI Remaining ROS negative:   Physical Exam: Blood pressure 145/98, pulse 88, temperature 98.6 F (37 C), temperature source Oral, height 5\' 7"  (1.702 m), weight 193 lb 8 oz (87.771 kg), SpO2 100 %. Wt Readings from Last 3 Encounters:  12/23/14 193 lb 8 oz (87.771 kg)  10/30/14 180 lb (81.647  kg)  10/23/14 180 lb (81.647 kg)     General appearance: well nourished caucasian man HENNT: Pharynx no erythema, exudate, mass, or ulcer. No thyromegaly or thyroid nodules Lymph nodes: No cervical, supraclavicular, or axillary lymphadenopathy Breasts:  Lungs: Clear to auscultation, resonant to percussion throughout Heart: Regular rhythm, no murmur, no gallop, no rub, no click, no edema Abdomen: Soft, nontender, normal bowel sounds, no mass, no organomegaly Extremities: No edema, no calf tenderness Musculoskeletal: no joint deformities GU:  Vascular: Carotid pulses 2+, no bruits,  Neurologic: Alert, oriented, PERRLA, cranial nerves grossly normal, motor strength 5 over 5, reflexes 1+ symmetric, upper body coordination normal, gait normal, Skin: No rash or ecchymosis  Lab Results: CBC W/Diff    Component Value Date/Time   WBC 6.4 12/16/2014 0955   RBC 4.92 12/16/2014 0955   HGB 16.0 12/16/2014 0955   HCT 45.7 12/16/2014 0955   PLT 159 12/16/2014 0955   MCV 92.9 12/16/2014 0955   MCH 32.5 12/16/2014 0955   MCHC 35.0 12/16/2014 0955   RDW 13.5 12/16/2014 0955   LYMPHSABS 1.5 12/16/2014 0955   MONOABS 0.5 12/16/2014 0955   EOSABS 0.3 12/16/2014 0955   BASOSABS 0.0 12/16/2014 0955     Chemistry      Component Value Date/Time   NA 139 12/16/2014 0955   K 4.5 12/16/2014 0955   CL 104 12/16/2014 0955   CO2 26 12/16/2014 0955   BUN 16 12/16/2014 0955   CREATININE 0.97 12/16/2014 0955   CREATININE 0.87 07/09/2014 1100      Component Value Date/Time   CALCIUM 9.0 12/16/2014 0955  ALKPHOS 73 12/16/2014 0955   AST 49* 12/16/2014 0955   ALT 76* 12/16/2014 0955   BILITOT 1.3* 12/16/2014 0955    Ferritin: 1354 on 12/16/14; 1404 08/27/14; 1392 07/09/14   Radiological Studies: No results found.  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  No response to phlebotomy program to date. Hemoglobin is back up to 16 g. I'm going to put  him back on a weekly phlebotomy program. Continue weekly phlebotomy as long as this is tolerated and hemoglobin remains 12 g or above.   CC: Patient Care Team: Pcp Not In System as PCP - General [name], MD as Consulting Physician (Oncology) Wilford Corner, MD as Consulting Physician (Gastroenterology) Sueanne Margarita, MD as Consulting Physician (Cardiology)   [name], MD 4/11/201611:37 AM

## 2014-12-23 NOTE — Patient Instructions (Signed)
Resume weekly phlebotomy x 6 weeks then re-evaluate Lab on 5/19 to check ferritin MD visit 6/13 @ 2:30 PM

## 2014-12-25 ENCOUNTER — Other Ambulatory Visit (HOSPITAL_COMMUNITY): Payer: Self-pay | Admitting: *Deleted

## 2014-12-26 ENCOUNTER — Encounter (HOSPITAL_COMMUNITY)
Admission: RE | Admit: 2014-12-26 | Discharge: 2014-12-26 | Disposition: A | Discharge status: Home / Self Care | Payer: BC Managed Care – PPO | Source: Ambulatory Visit | Attending: Oncology | Admitting: Oncology

## 2014-12-26 LAB — POCT HEMOGLOBIN-HEMACUE: Hemoglobin: 16.2 g/dL (ref 13.0–17.0)

## 2014-12-26 NOTE — Progress Notes (Signed)
Hemoglobin today was 16.  phlebotomzied 500cc of blood using pt's left AC and pt tolerated procedure without any difficulty.

## 2015-01-02 ENCOUNTER — Encounter (HOSPITAL_COMMUNITY)
Admission: RE | Admit: 2015-01-02 | Discharge: 2015-01-02 | Disposition: A | Discharge status: Home / Self Care | Payer: BC Managed Care – PPO | Source: Ambulatory Visit | Attending: Oncology | Admitting: Oncology

## 2015-01-02 LAB — POCT HEMOGLOBIN-HEMACUE: Hemoglobin: 14.5 g/dL (ref 13.0–17.0)

## 2015-01-02 NOTE — Progress Notes (Signed)
Called Dr. Beryle Beams to verify if ok to use hemocue for hemoglobin. Dr. Beryle Beams said it is ok. Hgb 14.5

## 2015-01-09 ENCOUNTER — Encounter (HOSPITAL_COMMUNITY)
Admission: RE | Admit: 2015-01-09 | Discharge: 2015-01-09 | Disposition: A | Discharge status: Home / Self Care | Payer: BC Managed Care – PPO | Source: Ambulatory Visit | Attending: Oncology | Admitting: Oncology

## 2015-01-09 LAB — POCT HEMOGLOBIN-HEMACUE: Hemoglobin: 13.9 g/dL (ref 13.0–17.0)

## 2015-01-09 NOTE — Progress Notes (Signed)
Therapeutic phlebotomy of 500cc performed without difficulty.  Pt tolerated procedure well.  Advised of need to stay for 30 minutes s/p procedure and po's offered.

## 2015-01-16 ENCOUNTER — Encounter (HOSPITAL_COMMUNITY)
Admission: RE | Admit: 2015-01-16 | Discharge: 2015-01-16 | Disposition: A | Discharge status: Home / Self Care | Payer: BC Managed Care – PPO | Source: Ambulatory Visit | Attending: Oncology | Admitting: Oncology

## 2015-01-16 LAB — POCT HEMOGLOBIN-HEMACUE: HEMOGLOBIN: 14.2 g/dL (ref 13.0–17.0)

## 2015-01-16 NOTE — Progress Notes (Signed)
hemocue 14.2 today.  Phlebotomized 500cc from pt's left AC and pt tolerated procedure well.

## 2015-01-22 ENCOUNTER — Other Ambulatory Visit: Payer: Self-pay | Admitting: Oncology

## 2015-01-23 ENCOUNTER — Encounter (HOSPITAL_COMMUNITY)
Admission: RE | Admit: 2015-01-23 | Discharge: 2015-01-23 | Disposition: A | Discharge status: Home / Self Care | Payer: BC Managed Care – PPO | Source: Ambulatory Visit | Attending: Oncology | Admitting: Oncology

## 2015-01-23 LAB — POCT HEMOGLOBIN-HEMACUE: Hemoglobin: 11.3 g/dL — ABNORMAL LOW (ref 13.0–17.0)

## 2015-01-23 NOTE — Progress Notes (Signed)
hemocue 11.3 today. Called Dr. Beryle Beams. Orders received. Phlebotomized 500 ml from right a/c on first attempt without difficulty. Tolerated well.

## 2015-01-30 ENCOUNTER — Encounter (HOSPITAL_COMMUNITY)
Admission: RE | Admit: 2015-01-30 | Discharge: 2015-01-30 | Disposition: A | Discharge status: Home / Self Care | Payer: BC Managed Care – PPO | Source: Ambulatory Visit | Attending: Oncology | Admitting: Oncology

## 2015-01-30 NOTE — Progress Notes (Signed)
Pt came in today for scheduled therapeutic phlebotomy.  Pt's HemoCue prior to procedure was 15.0  Left AC was used.  500 cc of blood was removed per Md or and per protocol.  Pt tolerated procedure well.  Will continue to monitor

## 2015-01-31 LAB — POCT HEMOGLOBIN-HEMACUE: Hemoglobin: 15 g/dL (ref 13.0–17.0)

## 2015-02-05 ENCOUNTER — Other Ambulatory Visit (HOSPITAL_COMMUNITY): Payer: Self-pay | Admitting: *Deleted

## 2015-02-06 ENCOUNTER — Encounter (HOSPITAL_COMMUNITY)
Admission: RE | Admit: 2015-02-06 | Discharge: 2015-02-06 | Disposition: A | Discharge status: Home / Self Care | Payer: BC Managed Care – PPO | Source: Ambulatory Visit | Attending: Oncology | Admitting: Oncology

## 2015-02-06 LAB — POCT HEMOGLOBIN-HEMACUE: Hemoglobin: 13.6 g/dL (ref 13.0–17.0)

## 2015-02-06 NOTE — Progress Notes (Signed)
Therapeutic Phlebotomy performed per MD order and per protocol.  PT's HemoCue prior to procedure was 13.6  500 cc of blood was removed using right AC.Marland Kitchen Pt tolerated procedure well.  Will continue to monitor

## 2015-02-13 ENCOUNTER — Encounter (HOSPITAL_COMMUNITY)
Admission: RE | Admit: 2015-02-13 | Discharge: 2015-02-13 | Disposition: A | Discharge status: Home / Self Care | Payer: BC Managed Care – PPO | Source: Ambulatory Visit | Attending: Oncology | Admitting: Oncology

## 2015-02-13 LAB — POCT HEMOGLOBIN-HEMACUE: Hemoglobin: 14.9 g/dL (ref 13.0–17.0)

## 2015-03-11 ENCOUNTER — Other Ambulatory Visit: Payer: Self-pay | Admitting: Oncology

## 2015-03-19 ENCOUNTER — Other Ambulatory Visit (INDEPENDENT_AMBULATORY_CARE_PROVIDER_SITE_OTHER): Payer: BC Managed Care – PPO

## 2015-03-19 ENCOUNTER — Encounter: Payer: Self-pay | Admitting: Cardiology

## 2015-03-19 LAB — CBC WITH DIFFERENTIAL/PLATELET
Basophils Absolute: 0 10*3/uL (ref 0.0–0.1)
Basophils Relative: 0 % (ref 0–1)
EOS ABS: 0.2 10*3/uL (ref 0.0–0.7)
EOS PCT: 3 % (ref 0–5)
HCT: 45.6 % (ref 39.0–52.0)
Hemoglobin: 16 g/dL (ref 13.0–17.0)
LYMPHS ABS: 1.4 10*3/uL (ref 0.7–4.0)
Lymphocytes Relative: 22 % (ref 12–46)
MCH: 33.3 pg (ref 26.0–34.0)
MCHC: 35.1 g/dL (ref 30.0–36.0)
MCV: 95 fL (ref 78.0–100.0)
MPV: 10.2 fL (ref 8.6–12.4)
Monocytes Absolute: 0.4 10*3/uL (ref 0.1–1.0)
Monocytes Relative: 7 % (ref 3–12)
Neutro Abs: 4.2 10*3/uL (ref 1.7–7.7)
Neutrophils Relative %: 68 % (ref 43–77)
Platelets: 170 10*3/uL (ref 150–400)
RBC: 4.8 MIL/uL (ref 4.22–5.81)
RDW: 13.4 % (ref 11.5–15.5)
WBC: 6.2 10*3/uL (ref 4.0–10.5)

## 2015-03-19 LAB — COMPREHENSIVE METABOLIC PANEL
ALK PHOS: 66 U/L (ref 39–117)
ALT: 40 U/L (ref 0–53)
AST: 31 U/L (ref 0–37)
Albumin: 4.3 g/dL (ref 3.5–5.2)
BUN: 16 mg/dL (ref 6–23)
CO2: 24 mEq/L (ref 19–32)
Calcium: 9.4 mg/dL (ref 8.4–10.5)
Chloride: 107 mEq/L (ref 96–112)
Creat: 0.9 mg/dL (ref 0.50–1.35)
GLUCOSE: 98 mg/dL (ref 70–99)
Potassium: 4.3 mEq/L (ref 3.5–5.3)
SODIUM: 142 meq/L (ref 135–145)
TOTAL PROTEIN: 6.9 g/dL (ref 6.0–8.3)
Total Bilirubin: 1.2 mg/dL (ref 0.2–1.2)

## 2015-03-19 LAB — FERRITIN: Ferritin: 606 ng/mL — ABNORMAL HIGH (ref 22–322)

## 2015-03-20 ENCOUNTER — Other Ambulatory Visit: Payer: Self-pay | Admitting: Oncology

## 2015-03-20 ENCOUNTER — Encounter: Payer: Self-pay | Admitting: Oncology

## 2015-03-21 ENCOUNTER — Telehealth: Payer: Self-pay | Admitting: *Deleted

## 2015-03-21 NOTE — Telephone Encounter (Signed)
Pt called - no answer; left message Ferritin level finally coming down, was 1354 now 606; Hgb good @ 16.0; and will resume phlebotomies every 2 weeks per Dr Beryle Beams. And I have schedule w/Short Stay appt @ 12noon after his appt on the 12th; message left. And to call if he has any questions.

## 2015-03-21 NOTE — Telephone Encounter (Signed)
-----   Message from [name], MD sent at 03/20/2015  8:45 AM EDT ----- Call pt: ferritin finally coming down. Was 1354 now 80. Hemoglobin good at 16.  I would like to do phlebotomy every 2 weeks for now. I will schedule him to resume next Tuesday 7/12.  He has appt with me on Tuesday. Please check with short stay. See if we can do phlebotomy same day

## 2015-03-25 ENCOUNTER — Ambulatory Visit (INDEPENDENT_AMBULATORY_CARE_PROVIDER_SITE_OTHER): Payer: BC Managed Care – PPO | Admitting: Oncology

## 2015-03-25 ENCOUNTER — Encounter: Payer: Self-pay | Admitting: Oncology

## 2015-03-25 ENCOUNTER — Encounter (HOSPITAL_COMMUNITY)
Admission: RE | Admit: 2015-03-25 | Discharge: 2015-03-25 | Disposition: A | Discharge status: Home / Self Care | Payer: BC Managed Care – PPO | Source: Ambulatory Visit | Attending: Oncology | Admitting: Oncology

## 2015-03-25 LAB — POCT HEMOGLOBIN-HEMACUE: HEMOGLOBIN: 16.4 g/dL (ref 13.0–17.0)

## 2015-03-25 NOTE — Patient Instructions (Signed)
Change to every 2 week phlebotomy Lab in October MD visit 1 week after lab

## 2015-03-25 NOTE — Progress Notes (Signed)
Pt came in today for scheduled therapeutic phlebotomy.  Pt's HemoCue prior to procedure was 16.4  500 cc of blood was removed per MD order and per protocol.  Pt's right AC was used.  Pt tolerated procedure well.. Will continue to monitor

## 2015-03-25 NOTE — Progress Notes (Signed)
Patient ID: Alexander Pollard, male   DOB: October 27, 1950, 64 y.o.   MRN: [id number] Hematology and Oncology Follow Up Visit  Alexander Pollard [id number] Jun 24, 1951 64 y.o. 03/25/2015 6:28 PM   Principle Diagnosis: Encounter Diagnosis  Name Primary?  . 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 Further evaluation of mild liver function abnormalities revealed significant elevation of serum iron 264, percent saturation 97, and ferritin 1498. CT scan of the abdomen done 01/16/2014 showed overall normal appearance of the liver. Prominent spleen 16 cm. Subsequent ultrasound with elastography done on September 15 showed a normal-size spleen 13 cm. Several small cysts in the liver but no parenchymal mass. Fibrosis score F2 with some areas F3. He was started on a phlebotomy program on 06/11/2014. He had 11 , 500 mL phlebotomies at two-week intervals through 10/16/14 but ferritin did not come down and he was started on a weekly phlebotomy program beginning on February 10 and received phlebotomy on February 10 and February 17.  Orders expired and he has not had a phlebotomy since February 17 and there was a gap in his program until we resume phlebotomy again December 26, 2014. At that point, repeat ferritin  done on April 4 showed no improvement compared with his baseline at  1354 compared with 1392 back in October 2015 Liver functions remained abnormal with bilirubin 1.3, SGOT 49, SGPT 76. His program was intensified back to  a weekly schedule. He has now had 8 more phlebotomies    Interim History:   Since his last visit, he has  had 8 more phlebotomies and we have finally starting to see a response with a 50% reduction in his ferritin level to 606 as of 03/19/2015. There has been a concomitant improvement in his transaminase enzymes with normalization. SGOT 31, SGPT 40. Bilirubin 1.2. He has tolerated the weekly phlebotomies well. He has had  no other interim medical problems and reports no new symptoms today.  Medications: reviewed  Allergies: No Known Allergies  Review of Systems: See HPI Remaining ROS negative:   Physical Exam: Blood pressure 136/89, pulse 76, temperature 97.7 F (36.5 C), temperature source Oral, height 5\' 7"  (1.702 m), weight 191 lb 6.4 oz (86.818 kg), SpO2 99 %. Wt Readings from Last 3 Encounters:  03/25/15 186 lb (84.369 kg)  03/25/15 191 lb 6.4 oz (86.818 kg)  02/13/15 185 lb (83.915 kg)     General appearance: well nourished caucasian man HENNT: Pharynx no erythema, exudate, mass, or ulcer. No thyromegaly or thyroid nodules Lymph nodes: No cervical, supraclavicular, or axillary lymphadenopathy Breasts:  Lungs: Clear to auscultation, resonant to percussion throughout Heart: Regular rhythm, no murmur, no gallop, no rub, no click, no edema Abdomen: Soft, nontender, normal bowel sounds, no mass, no organomegaly Extremities: No edema, no calf tenderness Musculoskeletal: no joint deformities GU:  Vascular: Carotid pulses 2+, no bruits Neurologic: Alert, oriented, PERRLA, cranial nerves grossly normal, motor strength 5 over 5, reflexes 1+ symmetric, upper body coordination normal, gait normal, Skin: No rash or ecchymosis  Lab Results: CBC W/Diff    Component Value Date/Time   WBC 6.2 03/19/2015 0925   RBC 4.80 03/19/2015 0925   HGB 16.4 03/25/2015 1102   HCT 45.6 03/19/2015 0925   PLT 170 03/19/2015 0925   MCV 95.0 03/19/2015 0925   MCH 33.3 03/19/2015 0925   MCHC 35.1 03/19/2015 0925   RDW 13.4 03/19/2015 0925   LYMPHSABS 1.4 03/19/2015 0925   MONOABS  0.4 03/19/2015 0925   EOSABS 0.2 03/19/2015 0925   BASOSABS 0.0 03/19/2015 0925     Chemistry      Component Value Date/Time   NA 142 03/19/2015 0925   K 4.3 03/19/2015 0925   CL 107 03/19/2015 0925   CO2 24 03/19/2015 0925   BUN 16 03/19/2015 0925   CREATININE 0.90 03/19/2015 0925   CREATININE 0.87 07/09/2014 1100       Component Value Date/Time   CALCIUM 9.4 03/19/2015 0925   ALKPHOS 66 03/19/2015 0925   AST 31 03/19/2015 0925   ALT 40 03/19/2015 0925   BILITOT 1.2 03/19/2015 0925       Radiological Studies: No results found.  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  Now starting to respond to phlebotomy program initially started in September 2015. I'm going to change him to an every 2 week schedule for the next series of phlebotomies and then reevaluate. Following his next visit with me in 3 months, I will get a follow-up ultrasound of his liver to reassess the degree of fibrosis once we get ferritin down into a normal range. We discussed dietary restrictions with respect to avoiding raw seafood. Certain bacteria (vibrio) metabolize iron and can cause severe infections in hemochromatosis patients. We discussed the risk of developing liver cancer. Both CT and ultrasound currently show a normal liver. I do not routinely check tumor markers such as alpha-fetoprotein unless there is an abnormality on a radiographic study. In fact, I do not obtain routine radiographic studies in people who have been successfully iron depleted by phlebotomy since it is the chronic inflammation from the iron deposition that is the risk factor for developing hepatocellular carcinoma. Once this risk factor is removed, chance of getting liver cancer is extremely low. In fact, I have never had a hemochromatosis patient developed liver cancer following a successful phlebotomy program.   CC: Patient Care Team: Dibas Koirala, MD as PCP - General (Family Medicine) [name], MD as Consulting Physician (Oncology) Wilford Corner, MD as Consulting Physician (Gastroenterology) Sueanne Margarita, MD as Consulting Physician (Cardiology) [name], MD as Consulting Physician (Oncology)   [name], MD 7/12/20166:28 PM

## 2015-04-07 ENCOUNTER — Other Ambulatory Visit (HOSPITAL_COMMUNITY): Payer: Self-pay | Admitting: *Deleted

## 2015-04-08 ENCOUNTER — Encounter (HOSPITAL_COMMUNITY)
Admission: RE | Admit: 2015-04-08 | Discharge: 2015-04-08 | Disposition: A | Discharge status: Home / Self Care | Payer: BC Managed Care – PPO | Source: Ambulatory Visit | Attending: Oncology | Admitting: Oncology

## 2015-04-08 LAB — POCT HEMOGLOBIN-HEMACUE: HEMOGLOBIN: 14.3 g/dL (ref 13.0–17.0)

## 2015-04-08 NOTE — Progress Notes (Signed)
Hemocue 14.3; 500cc Phlebotomy complete using left antecub; pt. Tolerated well

## 2015-04-22 ENCOUNTER — Encounter (HOSPITAL_COMMUNITY)
Admission: RE | Admit: 2015-04-22 | Discharge: 2015-04-22 | Disposition: A | Discharge status: Home / Self Care | Payer: BC Managed Care – PPO | Source: Ambulatory Visit | Attending: Oncology | Admitting: Oncology

## 2015-04-22 LAB — POCT HEMOGLOBIN-HEMACUE: Hemoglobin: 13.2 g/dL (ref 13.0–17.0)

## 2015-04-22 NOTE — Progress Notes (Signed)
hemocue 13.2 today phlebotomized 500cc and pt tolerated procedure well.

## 2015-04-25 ENCOUNTER — Encounter (HOSPITAL_COMMUNITY): Payer: BC Managed Care – PPO

## 2015-05-06 ENCOUNTER — Encounter (HOSPITAL_COMMUNITY)
Admission: RE | Admit: 2015-05-06 | Discharge: 2015-05-06 | Disposition: A | Discharge status: Home / Self Care | Payer: BC Managed Care – PPO | Source: Ambulatory Visit | Attending: Oncology | Admitting: Oncology

## 2015-05-06 LAB — POCT HEMOGLOBIN-HEMACUE: Hemoglobin: 14.3 g/dL (ref 13.0–17.0)

## 2015-05-21 ENCOUNTER — Encounter (HOSPITAL_COMMUNITY)
Admission: RE | Admit: 2015-05-21 | Discharge: 2015-05-21 | Disposition: A | Discharge status: Home / Self Care | Payer: BC Managed Care – PPO | Source: Ambulatory Visit | Attending: Oncology | Admitting: Oncology

## 2015-05-21 LAB — POCT HEMOGLOBIN-HEMACUE: Hemoglobin: 15.2 g/dL (ref 13.0–17.0)

## 2015-05-21 NOTE — Progress Notes (Signed)
hemocue greater than 15 today.  Phlebotomized 500cc and pt tolerated procedure well.

## 2015-06-04 ENCOUNTER — Ambulatory Visit (INDEPENDENT_AMBULATORY_CARE_PROVIDER_SITE_OTHER): Payer: BC Managed Care – PPO | Admitting: Cardiology

## 2015-06-04 ENCOUNTER — Encounter (HOSPITAL_COMMUNITY)
Admission: RE | Admit: 2015-06-04 | Discharge: 2015-06-04 | Disposition: A | Discharge status: Home / Self Care | Payer: BC Managed Care – PPO | Source: Ambulatory Visit | Attending: Oncology | Admitting: Oncology

## 2015-06-04 ENCOUNTER — Encounter: Payer: Self-pay | Admitting: Cardiology

## 2015-06-04 VITALS — BP 120/86 | HR 76 | Ht 68.0 in | Wt 189.1 lb

## 2015-06-04 DIAGNOSIS — R0789 Other chest pain: Secondary | ICD-10-CM | POA: Diagnosis not present

## 2015-06-04 DIAGNOSIS — Z8249 Family history of ischemic heart disease and other diseases of the circulatory system: Secondary | ICD-10-CM | POA: Diagnosis not present

## 2015-06-04 DIAGNOSIS — E785 Hyperlipidemia, unspecified: Secondary | ICD-10-CM | POA: Diagnosis not present

## 2015-06-04 DIAGNOSIS — I77810 Thoracic aortic ectasia: Secondary | ICD-10-CM

## 2015-06-04 LAB — POCT HEMOGLOBIN-HEMACUE: HEMOGLOBIN: 14.8 g/dL (ref 13.0–17.0)

## 2015-06-04 NOTE — Progress Notes (Signed)
Pt came in today for his scheduled therapeutic phlebotomy.  His HemoCue prior to arrival was 14.6  Right AC was accessed.  Pt tolerated procedure well.  500 cc was removed per MD order and per protocol.  Will continue to monitor

## 2015-06-04 NOTE — Patient Instructions (Signed)
Medication Instructions:  Your physician recommends that you continue on your current medications as directed. Please refer to the Current Medication list given to you today.   Labwork: None  Testing/Procedures: Your physician has requested that you have an echocardiogram. Echocardiography is a painless test that uses sound waves to create images of your heart. It provides your doctor with information about the size and shape of your heart and how well your heart's chambers and valves are working. This procedure takes approximately one hour. There are no restrictions for this procedure.   Your physician has requested that you have an exercise tolerance test. For further information please visit [email or web address]. Please also follow instruction sheet, as given.  Follow-Up: Your physician wants you to follow-up in: 1 year with Dr. Radford Pax. You will receive a reminder letter in the mail two months in advance. If you don't receive a letter, please call our office to schedule the follow-up appointment.   Any Other Special Instructions Will Be Listed Below (If Applicable).

## 2015-06-04 NOTE — Progress Notes (Signed)
Cardiology Office Note   Date:  06/04/2015   ID:  Alexander Pollard, DOB 03/15/1951, MRN [id number]  PCP:  Lujean Amel, MD    Chief Complaint  Patient presents with  . Family Hx of CAD      History of Present Illness: Alexander Pollard is a 64 y.o. male with a history of mildy dilated aortic root, diastolic dysfunction and family history of CAD who presents today for followup. He is doing well. He denies any SOB, DOE, LE edema, dizziness, palpitations or syncope. He lifts weight and runs 25 miles weekly and bikes for exercise.  He does get some tightness in his chest when he exercises at the beginning but then goes away.       Past Medical History  Diagnosis Date  . Hemochromatosis 03/12/2014    C282Y homozygote dx 01/02/14  Ferritin 1498  Iron saturation 97%  . Low HDL (under 40)     Nl LDL and TG's  . Family history of early CAD     Past Surgical History  Procedure Laterality Date  . Colonoscopy      next due 08/2017     No current outpatient prescriptions on file.   No current facility-administered medications for this visit.    Allergies:   Review of patient's allergies indicates no known allergies.    Social History:  The patient  reports that he has quit smoking. He does not have any smokeless tobacco history on file. He reports that he drinks alcohol. He reports that he does not use illicit drugs.   Family History:  The patient's family history includes Heart attack in his mother; Heart disease in his brother and mother; Leukemia in his brother; Lymphoma in his brother.    ROS:  Please see the history of present illness.   Otherwise, review of systems are positive for none.   All other systems are reviewed and negative.    PHYSICAL EXAM: VS:  BP 120/86 mmHg  Pulse 76  Ht 5\' 8"  (1.727 m)  Wt 189 lb 1.9 oz (85.784 kg)  BMI 28.76 kg/m2 , BMI Body mass index is 28.76 kg/(m^2). GEN: Well nourished, well developed, in no acute  distress HEENT: normal Neck: no JVD, carotid bruits, or masses Cardiac: RRR; no murmurs, rubs, or gallops,no edema  Respiratory:  clear to auscultation bilaterally, normal work of breathing GI: soft, nontender, nondistended, + BS MS: no deformity or atrophy Skin: warm and dry, no rash Neuro:  Strength and sensation are intact Psych: euthymic mood, full affect   EKG:  EKG is ordered today. The ekg ordered today demonstrates NSR with no ST changes   Recent Labs: 03/19/2015: ALT 40; BUN 16; Creat 0.90; Platelets 170; Potassium 4.3; Sodium 142 05/21/2015: Hemoglobin 15.2    Lipid Panel No results found for: CHOL, TRIG, HDL, CHOLHDL, VLDL, LDLCALC, LDLDIRECT    Wt Readings from Last 3 Encounters:  06/04/15 189 lb 1.9 oz (85.784 kg)  05/21/15 181 lb (82.101 kg)  05/06/15 185 lb (83.915 kg)    ASSESSMENT AND PLAN:  1. Family history of CAD - he has some tightness with exercise at the beginning but it goes away. I will repeat an ETT to rule out ischemia. 2. Dyslipidemia  - I will get lipids from PCP to review. 3. Dilated aortic root - recheck 2D echo to assess for progression    Current medicines are reviewed  at length with the patient today.  The patient does not have concerns regarding medicines.  The following changes have been made:  no change  Labs/ tests ordered today: See above Assessment and Plan No orders of the defined types were placed in this encounter.     Disposition:   FU with me in 1 year  Signed, Sueanne Margarita, MD  06/04/2015 8:24 AM    [facility] [place], [place], [place]  [address] Phone: [phone]; Fax: [phone]

## 2015-07-02 ENCOUNTER — Other Ambulatory Visit (INDEPENDENT_AMBULATORY_CARE_PROVIDER_SITE_OTHER): Payer: BC Managed Care – PPO

## 2015-07-02 ENCOUNTER — Ambulatory Visit (HOSPITAL_COMMUNITY): Payer: BC Managed Care – PPO | Attending: Cardiology

## 2015-07-02 ENCOUNTER — Other Ambulatory Visit: Payer: BC Managed Care – PPO

## 2015-07-02 ENCOUNTER — Ambulatory Visit (INDEPENDENT_AMBULATORY_CARE_PROVIDER_SITE_OTHER): Payer: BC Managed Care – PPO

## 2015-07-02 ENCOUNTER — Encounter: Payer: BC Managed Care – PPO | Admitting: Nurse Practitioner

## 2015-07-02 ENCOUNTER — Other Ambulatory Visit: Payer: Self-pay | Admitting: Nurse Practitioner

## 2015-07-02 ENCOUNTER — Other Ambulatory Visit: Payer: Self-pay

## 2015-07-02 DIAGNOSIS — R0789 Other chest pain: Secondary | ICD-10-CM

## 2015-07-02 DIAGNOSIS — Z8249 Family history of ischemic heart disease and other diseases of the circulatory system: Secondary | ICD-10-CM | POA: Diagnosis not present

## 2015-07-02 DIAGNOSIS — I77810 Thoracic aortic ectasia: Secondary | ICD-10-CM | POA: Insufficient documentation

## 2015-07-02 DIAGNOSIS — I10 Essential (primary) hypertension: Secondary | ICD-10-CM

## 2015-07-02 DIAGNOSIS — E785 Hyperlipidemia, unspecified: Secondary | ICD-10-CM | POA: Diagnosis not present

## 2015-07-02 LAB — EXERCISE TOLERANCE TEST
CHL CUP MPHR: 156 {beats}/min
CSEPEDS: 0 s
CSEPHR: 78 %
CSEPPHR: 123 {beats}/min
Estimated workload: 7 METS
Exercise duration (min): 6 min
RPE: 11
Rest HR: 86 {beats}/min

## 2015-07-02 MED ORDER — METOPROLOL SUCCINATE ER 25 MG PO TB24: 25.0000 mg | ORAL_TABLET | Freq: Every day | ORAL | Status: DC

## 2015-07-03 LAB — CBC WITH DIFFERENTIAL/PLATELET
BASOS ABS: 0 10*3/uL (ref 0.0–0.2)
BASOS: 0 %
EOS (ABSOLUTE): 0.3 10*3/uL (ref 0.0–0.4)
Eos: 4 %
Hematocrit: 47.5 % (ref 37.5–51.0)
Hemoglobin: 16.7 g/dL (ref 12.6–17.7)
IMMATURE GRANS (ABS): 0 10*3/uL (ref 0.0–0.1)
Immature Granulocytes: 0 %
LYMPHS ABS: 2.1 10*3/uL (ref 0.7–3.1)
Lymphs: 30 %
MCH: 33.3 pg — AB (ref 26.6–33.0)
MCHC: 35.2 g/dL (ref 31.5–35.7)
MCV: 95 fL (ref 79–97)
Monocytes Absolute: 0.6 10*3/uL (ref 0.1–0.9)
Monocytes: 8 %
NEUTROS ABS: 4 10*3/uL (ref 1.4–7.0)
Neutrophils: 58 %
Platelets: 174 10*3/uL (ref 150–379)
RBC: 5.01 x10E6/uL (ref 4.14–5.80)
RDW: 13.8 % (ref 12.3–15.4)
WBC: 7 10*3/uL (ref 3.4–10.8)

## 2015-07-03 LAB — CMP14 + ANION GAP
A/G RATIO: 2.2 (ref 1.1–2.5)
ALT: 27 IU/L (ref 0–44)
AST: 22 IU/L (ref 0–40)
Albumin: 4.7 g/dL (ref 3.6–4.8)
Alkaline Phosphatase: 75 IU/L (ref 39–117)
Anion Gap: 14 mmol/L (ref 10.0–18.0)
BILIRUBIN TOTAL: 1.2 mg/dL (ref 0.0–1.2)
BUN/Creatinine Ratio: 17 (ref 10–22)
BUN: 16 mg/dL (ref 8–27)
CALCIUM: 9.3 mg/dL (ref 8.6–10.2)
CHLORIDE: 103 mmol/L (ref 97–106)
CO2: 24 mmol/L (ref 18–29)
Creatinine, Ser: 0.94 mg/dL (ref 0.76–1.27)
GFR, EST AFRICAN AMERICAN: 99 mL/min/{1.73_m2} (ref >59)
GFR, EST NON AFRICAN AMERICAN: 85 mL/min/{1.73_m2} (ref >59)
GLOBULIN, TOTAL: 2.1 g/dL (ref 1.5–4.5)
GLUCOSE: 101 mg/dL — AB (ref 65–99)
POTASSIUM: 4.9 mmol/L (ref 3.5–5.2)
SODIUM: 141 mmol/L (ref 136–144)
TOTAL PROTEIN: 6.8 g/dL (ref 6.0–8.5)

## 2015-07-03 LAB — FERRITIN: Ferritin: 397 ng/mL (ref 30–400)

## 2015-07-04 ENCOUNTER — Telehealth: Payer: Self-pay

## 2015-07-04 DIAGNOSIS — I77810 Thoracic aortic ectasia: Secondary | ICD-10-CM

## 2015-07-04 NOTE — Telephone Encounter (Signed)
-----   Message from Sueanne Margarita, MD sent at 07/02/2015  8:29 PM EDT ----- Normal LVF with mildly dilated aortic root at 65mm - repeat echo in 1 year for dialted aortic root

## 2015-07-04 NOTE — Telephone Encounter (Signed)
Attempted to call patient x2 and phone disconnected each time.  Released results to [place] and instructed patient to call with questions or concerns. Repeat ECHO ordered for scheduling.

## 2015-07-08 ENCOUNTER — Ambulatory Visit (INDEPENDENT_AMBULATORY_CARE_PROVIDER_SITE_OTHER): Payer: BC Managed Care – PPO | Admitting: Oncology

## 2015-07-08 ENCOUNTER — Encounter: Payer: Self-pay | Admitting: Oncology

## 2015-07-08 NOTE — Patient Instructions (Signed)
Continue every 2 week phlebotomies Lab at medicine clinic on 09/10/15 MD visit 09/23/15

## 2015-07-08 NOTE — Progress Notes (Signed)
Patient ID: Alexander Pollard, male   DOB: [birth year], 64 y.o.   MRN: [id number] Hematology and Oncology Follow Up Visit  COLEY KULIKOWSKI [id number] 09-15-1950 64 y.o. 07/08/2015 3:33 PM   Principle Diagnosis: Encounter Diagnosis  Name Primary?  . 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 Further evaluation of mild liver function abnormalities revealed significant elevation of serum iron 264, percent saturation 97, and ferritin 1498. CT scan of the abdomen done 01/16/2014 showed overall normal appearance of the liver. Prominent spleen 16 cm. Subsequent ultrasound with elastography done on September 15 showed a normal-size spleen 13 cm. Several small cysts in the liver but no parenchymal mass. Fibrosis score F2 with some areas F3. He was started on a phlebotomy program on 06/11/2014. He had 11 , 500 mL phlebotomies at two-week intervals through 10/16/14 but ferritin did not come down and he was started on a weekly phlebotomy program beginning on February 10. Ferritin began to fall and he was changed to a every 2 week schedule beginning in May.  Interim History:   Overall he is doing well. He is in good shape and he continues to exercise. He recently had a treadmill test on 07/02/2015 which had to be stopped due to a significant rise in his blood pressure up to 195/107. No chest pain. No acute ischemic changes on EKG. No significant arrhythmias. He is now scheduled for a Myoview scan next week.  Ferritin has fallen from initial peak value of 1498 on 01/02/2014 at time of referral to most recent value of 397 on 07/02/2015. As of 03/19/2015, transaminase elevations have resolved.  Medications: reviewed  Allergies: No Known Allergies  Review of Systems: See HPI Remaining ROS negative:   Physical Exam: Blood pressure 139/82, pulse 88, temperature 98 F (36.7 C), temperature source Oral, height 5\' 7"  (1.702 m), weight 192  lb 1.6 oz (87.136 kg), SpO2 100 %. Wt Readings from Last 3 Encounters:  07/08/15 192 lb 1.6 oz (87.136 kg)  06/04/15 180 lb (81.647 kg)  06/04/15 189 lb 1.9 oz (85.784 kg)     General appearance: Healthy-appearing Caucasian man HENNT: Pharynx no erythema, exudate, mass, or ulcer. No thyromegaly or thyroid nodules Lymph nodes: No cervical, supraclavicular, or axillary lymphadenopathy Breasts:  Lungs: Clear to auscultation, resonant to percussion throughout Heart: Regular rhythm, no murmur, no gallop, no rub, no click, no edema Abdomen: Soft, nontender, normal bowel sounds, no mass, no organomegaly Extremities: No edema, no calf tenderness Musculoskeletal: no joint deformities GU:  Vascular: Carotid pulses 2+, no bruits, Neurologic: Alert, oriented, PERRLA, cranial nerves grossly normal, motor strength 5 over 5, reflexes 1+ symmetric, upper body coordination normal, gait normal, Skin: No rash or ecchymosis. No skin bronzing or hyperpigmented skin folds.  Lab Results: CBC W/Diff    Component Value Date/Time   WBC 7.0 07/02/2015 0845   WBC 6.2 03/19/2015 0925   RBC 5.01 07/02/2015 0845   RBC 4.80 03/19/2015 0925   HGB 14.8 06/04/2015 1303   HCT 47.5 07/02/2015 0845   HCT 45.6 03/19/2015 0925   PLT 170 03/19/2015 0925   MCV 95.0 03/19/2015 0925   MCH 33.3* 07/02/2015 0845   MCH 33.3 03/19/2015 0925   MCHC 35.2 07/02/2015 0845   MCHC 35.1 03/19/2015 0925   RDW 13.8 07/02/2015 0845   RDW 13.4 03/19/2015 0925   LYMPHSABS 2.1 07/02/2015 0845   LYMPHSABS 1.4 03/19/2015 0925   MONOABS 0.4 03/19/2015 0925   EOSABS 0.2  03/19/2015 0925   BASOSABS 0.0 07/02/2015 0845   BASOSABS 0.0 03/19/2015 0925     Chemistry      Component Value Date/Time   NA 141 07/02/2015 0845   NA 142 03/19/2015 0925   K 4.9 07/02/2015 0845   CL 103 07/02/2015 0845   CO2 24 07/02/2015 0845   BUN 16 07/02/2015 0845   BUN 16 03/19/2015 0925   CREATININE 0.94 07/02/2015 0845   CREATININE 0.90 03/19/2015  0925      Component Value Date/Time   CALCIUM 9.3 07/02/2015 0845   ALKPHOS 75 07/02/2015 0845   AST 22 07/02/2015 0845   ALT 27 07/02/2015 0845   BILITOT 1.2 07/02/2015 0845   BILITOT 1.2 03/19/2015 0925       Radiological Studies: No results found.  Impression:  #1. Homozygous hemochromatosis gene status Responding to phlebotomy with fall in ferritin and normalization of liver functions. I will continue every 2 week phlebotomies until ferritin less than or equal to 150 and then increase intervals in between procedures.  #2. Cardiology evaluation in progress for excessive rise in blood pressure during recent treadmill test.   CC: Patient Care Team: Dibas Koirala, MD as PCP - General (Family Medicine) [name], MD as Consulting Physician (Oncology) Wilford Corner, MD as Consulting Physician (Gastroenterology) Sueanne Margarita, MD as Consulting Physician (Cardiology) [name], MD as Consulting Physician (Oncology)   [name], MD 10/25/20163:33 PM

## 2015-07-09 ENCOUNTER — Telehealth (HOSPITAL_COMMUNITY): Payer: Self-pay | Admitting: *Deleted

## 2015-07-09 NOTE — Telephone Encounter (Signed)
Patient given detailed instructions per Myocardial Perfusion Study Information Sheet for the test on 07/15/15 at 745. Patient notified to arrive 15 minutes early and that it is imperative to arrive on time for appointment to keep from having the test rescheduled.  If you need to cancel or reschedule your appointment, please call the office within 24 hours of your appointment. Failure to do so may result in a cancellation of your appointment, and a $50 no show fee. Patient verbalized understanding. Hubbard Robinson, RN

## 2015-07-15 ENCOUNTER — Other Ambulatory Visit (HOSPITAL_COMMUNITY): Payer: Self-pay | Admitting: *Deleted

## 2015-07-15 ENCOUNTER — Ambulatory Visit (HOSPITAL_COMMUNITY): Payer: BC Managed Care – PPO | Attending: Cardiovascular Disease

## 2015-07-15 DIAGNOSIS — Z8249 Family history of ischemic heart disease and other diseases of the circulatory system: Secondary | ICD-10-CM | POA: Insufficient documentation

## 2015-07-15 DIAGNOSIS — I10 Essential (primary) hypertension: Secondary | ICD-10-CM | POA: Diagnosis not present

## 2015-07-15 DIAGNOSIS — R079 Chest pain, unspecified: Secondary | ICD-10-CM | POA: Diagnosis not present

## 2015-07-15 LAB — MYOCARDIAL PERFUSION IMAGING
LV dias vol: 94 mL
LV sys vol: 37 mL
Peak HR: 94 {beats}/min
RATE: 0.28
Rest HR: 67 {beats}/min
SDS: 0
SRS: 4
SSS: 4
TID: 1.02

## 2015-07-15 MED ORDER — TECHNETIUM TC 99M SESTAMIBI GENERIC - CARDIOLITE
10.8000 | Freq: Once | INTRAVENOUS | Status: AC | PRN
  Administered 2015-07-15: 11 via INTRAVENOUS

## 2015-07-15 MED ORDER — REGADENOSON 0.4 MG/5ML IV SOLN
0.4000 mg | Freq: Once | INTRAVENOUS | Status: AC
  Administered 2015-07-15: 0.4 mg via INTRAVENOUS

## 2015-07-15 MED ORDER — TECHNETIUM TC 99M SESTAMIBI GENERIC - CARDIOLITE
32.6000 | Freq: Once | INTRAVENOUS | Status: AC | PRN
  Administered 2015-07-15: 33 via INTRAVENOUS

## 2015-07-16 ENCOUNTER — Encounter (HOSPITAL_COMMUNITY)
Admission: RE | Admit: 2015-07-16 | Discharge: 2015-07-16 | Disposition: A | Discharge status: Home / Self Care | Payer: BC Managed Care – PPO | Source: Ambulatory Visit | Attending: Oncology | Admitting: Oncology

## 2015-07-16 LAB — POCT HEMOGLOBIN-HEMACUE: Hemoglobin: 16.8 g/dL (ref 13.0–17.0)

## 2015-07-16 NOTE — Progress Notes (Signed)
Pt came in today for scheduled therapeutic phlebotomy.  Pt's HemoCue prior to procedure was 16.8  500 cc of blood was removed per MD order.  Left AC was used.  Pt tolerated procedure well.  Will continue to monitor

## 2015-07-29 ENCOUNTER — Other Ambulatory Visit (HOSPITAL_COMMUNITY): Payer: Self-pay | Admitting: *Deleted

## 2015-07-30 ENCOUNTER — Encounter (HOSPITAL_COMMUNITY)
Admission: RE | Admit: 2015-07-30 | Discharge: 2015-07-30 | Disposition: A | Discharge status: Home / Self Care | Payer: BC Managed Care – PPO | Source: Ambulatory Visit | Attending: Oncology | Admitting: Oncology

## 2015-07-30 LAB — POCT HEMOGLOBIN-HEMACUE: Hemoglobin: 13.6 g/dL (ref 13.0–17.0)

## 2015-07-30 NOTE — Progress Notes (Signed)
Phlebotomized 500cc of blood from patient's right AC.  Pt tolerated procedure well.

## 2015-08-06 ENCOUNTER — Ambulatory Visit: Payer: BC Managed Care – PPO | Admitting: Cardiology

## 2015-08-06 ENCOUNTER — Telehealth: Payer: Self-pay | Admitting: Cardiology

## 2015-08-06 ENCOUNTER — Telehealth: Payer: Self-pay

## 2015-08-06 DIAGNOSIS — I10 Essential (primary) hypertension: Secondary | ICD-10-CM

## 2015-08-06 NOTE — Telephone Encounter (Signed)
New message   Pt called for RN

## 2015-08-06 NOTE — Telephone Encounter (Signed)
Left message to call back  

## 2015-08-06 NOTE — Telephone Encounter (Signed)
Patient called to ask if he can walk during the 5K tomorrow. Per Dr. Radford Pax, informed the patient he is allowed to [facility], not RUN, during the race. Instructed patient to stop and rest if symptoms arise, and to go to the ED if symptoms to not subside during rest. Patient agrees with treatment plan.

## 2015-08-06 NOTE — Telephone Encounter (Signed)
Per Dr. Radford Pax, cancelled OV today and ordered 24 hour BP cuff for patient instead. Patient requests clearance from Dr. Radford Pax to run in a 5K tomorrow.  Informed patient that Dr. Radford Pax advises against running in a race tomorrow given his BP was so high during stress test. Patient agrees with treatment plan.

## 2015-08-12 ENCOUNTER — Encounter: Payer: Self-pay | Admitting: *Deleted

## 2015-08-12 ENCOUNTER — Ambulatory Visit (INDEPENDENT_AMBULATORY_CARE_PROVIDER_SITE_OTHER): Payer: BC Managed Care – PPO

## 2015-08-12 ENCOUNTER — Other Ambulatory Visit (HOSPITAL_COMMUNITY): Payer: Self-pay | Admitting: *Deleted

## 2015-08-12 DIAGNOSIS — I10 Essential (primary) hypertension: Secondary | ICD-10-CM | POA: Diagnosis not present

## 2015-08-12 NOTE — Progress Notes (Signed)
Patient ID: Alexander Pollard, male   DOB: 12-18-1950, 64 y.o.   MRN: [id number] 24 hour ambulatory blood pressure monitor applied to patient.

## 2015-08-13 ENCOUNTER — Ambulatory Visit (HOSPITAL_COMMUNITY)
Admission: RE | Admit: 2015-08-13 | Discharge: 2015-08-13 | Disposition: A | Discharge status: Home / Self Care | Payer: BC Managed Care – PPO | Source: Ambulatory Visit | Attending: Oncology | Admitting: Oncology

## 2015-08-13 DIAGNOSIS — Z029 Encounter for administrative examinations, unspecified: Secondary | ICD-10-CM | POA: Insufficient documentation

## 2015-08-13 NOTE — Progress Notes (Signed)
Pre procedure HGB 13.1

## 2015-08-13 NOTE — Progress Notes (Signed)
Therapeutic phlebotomy performed without difficulty via right antecub.  Approx 500cc blood removed.  Pt tolerated procedure without difficulty.  Po's offered.  Will monitor prior to discharge.

## 2015-08-14 LAB — POCT HEMOGLOBIN-HEMACUE: Hemoglobin: 13.1 g/dL (ref 13.0–17.0)

## 2015-08-19 ENCOUNTER — Telehealth: Payer: Self-pay | Admitting: Cardiology

## 2015-08-19 NOTE — Telephone Encounter (Signed)
New Message    Pt calling stating that he wore a monitor last week and wants the results. Please call back and advise.

## 2015-08-19 NOTE — Telephone Encounter (Signed)
Informed patient results are not complete yet but he will be notified as soon as they are done. Patient grateful for call.

## 2015-08-26 ENCOUNTER — Other Ambulatory Visit (HOSPITAL_COMMUNITY): Payer: Self-pay | Admitting: *Deleted

## 2015-08-27 ENCOUNTER — Encounter (HOSPITAL_COMMUNITY)
Admission: RE | Admit: 2015-08-27 | Discharge: 2015-08-27 | Disposition: A | Discharge status: Home / Self Care | Payer: BC Managed Care – PPO | Source: Ambulatory Visit | Attending: Oncology | Admitting: Oncology

## 2015-08-27 LAB — POCT HEMOGLOBIN-HEMACUE: Hemoglobin: 13.7 g/dL (ref 13.0–17.0)

## 2015-08-27 NOTE — Progress Notes (Signed)
hemocue 13.7 today.  Phlebotomized 500cc from pt's left AC and pt tolerated well.

## 2015-08-29 ENCOUNTER — Telehealth: Payer: Self-pay | Admitting: *Deleted

## 2015-08-29 NOTE — Telephone Encounter (Signed)
I usually get a more expanded blood panel the week before I see him - we could let the folks at short stay draw this off his phlebotomy specimen - I do not know how to transfer current lab orders to do this.  I will decide how frequent he needs future phlebotomies when I see him on 1/10

## 2015-08-29 NOTE — Telephone Encounter (Signed)
Returned pt's call - he has phlebotomy scheduled on 12/28 and complete blood work scheduled on same day here at [facility] per pt.; He said usually this is not done on the same day. Wants to know should labs be re-schedule on another day? He has OV with you on Jan 10. He also wants to know if he suppose to get 6 or 8 phlebotomies this time around? Thanks

## 2015-08-29 NOTE — Telephone Encounter (Signed)
Stated he will have labs drawn here @[facility]  12/28 @ 1130 AM then have phlebotomy @1300  PM. And informed Dr Darnell Level will discuss # of phlebotomies at appt on 1/10.

## 2015-09-09 ENCOUNTER — Other Ambulatory Visit (HOSPITAL_COMMUNITY): Payer: Self-pay | Admitting: *Deleted

## 2015-09-09 ENCOUNTER — Telehealth: Payer: Self-pay | Admitting: Oncology

## 2015-09-09 NOTE — Telephone Encounter (Signed)
Call to patient to confirm appointment for 09/10/15 at 11:30 lmtcb

## 2015-09-10 ENCOUNTER — Other Ambulatory Visit (INDEPENDENT_AMBULATORY_CARE_PROVIDER_SITE_OTHER): Payer: BC Managed Care – PPO

## 2015-09-10 ENCOUNTER — Encounter (HOSPITAL_COMMUNITY)
Admission: RE | Admit: 2015-09-10 | Discharge: 2015-09-10 | Disposition: A | Discharge status: Home / Self Care | Payer: BC Managed Care – PPO | Source: Ambulatory Visit | Attending: Oncology | Admitting: Oncology

## 2015-09-10 LAB — POCT HEMOGLOBIN-HEMACUE: Hemoglobin: 12.4 g/dL — ABNORMAL LOW (ref 13.0–17.0)

## 2015-09-10 NOTE — Progress Notes (Signed)
hemocue 12.4 removed 500cc and pt tolerated well. Used right [facility]

## 2015-09-11 ENCOUNTER — Other Ambulatory Visit: Payer: Self-pay | Admitting: Oncology

## 2015-09-11 ENCOUNTER — Telehealth: Payer: Self-pay | Admitting: *Deleted

## 2015-09-11 DIAGNOSIS — R899 Unspecified abnormal finding in specimens from other organs, systems and tissues: Secondary | ICD-10-CM

## 2015-09-11 NOTE — Telephone Encounter (Signed)
-----   Message from [name], MD sent at 09/11/2015 11:11 AM EST ----- Call pt: ferritin continues to fall, now 233

## 2015-09-11 NOTE — Telephone Encounter (Signed)
Pt had phlebotomy today.

## 2015-09-12 LAB — COMPREHENSIVE METABOLIC PANEL
A/G RATIO: 2.3 (ref 1.1–2.5)
ALBUMIN: 4.5 g/dL (ref 3.6–4.8)
ALK PHOS: 70 IU/L (ref 39–117)
ALT: 21 IU/L (ref 0–44)
AST: 25 IU/L (ref 0–40)
BILIRUBIN TOTAL: 1.3 mg/dL — AB (ref 0.0–1.2)
BUN / CREAT RATIO: 15 (ref 10–22)
BUN: 14 mg/dL (ref 8–27)
CHLORIDE: 102 mmol/L (ref 96–106)
CO2: 20 mmol/L (ref 18–29)
Calcium: 9.1 mg/dL (ref 8.6–10.2)
Creatinine, Ser: 0.94 mg/dL (ref 0.76–1.27)
GFR calc non Af Amer: 85 mL/min/{1.73_m2} (ref >59)
GFR, EST AFRICAN AMERICAN: 99 mL/min/{1.73_m2} (ref >59)
GLOBULIN, TOTAL: 2 g/dL (ref 1.5–4.5)
Glucose: 92 mg/dL (ref 65–99)
Potassium: 4.2 mmol/L (ref 3.5–5.2)
SODIUM: 141 mmol/L (ref 134–144)
Total Protein: 6.5 g/dL (ref 6.0–8.5)

## 2015-09-16 LAB — CBC WITH DIFFERENTIAL/PLATELET
BASOS ABS: 0 10*3/uL (ref 0.0–0.2)
BASOS: 0 %
EOS (ABSOLUTE): 0.2 10*3/uL (ref 0.0–0.4)
EOS: 4 %
HEMATOCRIT: 43.7 % (ref 37.5–51.0)
HEMOGLOBIN: 15.5 g/dL (ref 12.6–17.7)
Immature Grans (Abs): 0 10*3/uL (ref 0.0–0.1)
Immature Granulocytes: 0 %
LYMPHS ABS: 1.6 10*3/uL (ref 0.7–3.1)
Lymphs: 26 %
MCH: 33.5 pg — AB (ref 26.6–33.0)
MCHC: 35.5 g/dL (ref 31.5–35.7)
MCV: 95 fL (ref 79–97)
MONOCYTES: 9 %
Monocytes Absolute: 0.6 10*3/uL (ref 0.1–0.9)
NEUTROS ABS: 3.8 10*3/uL (ref 1.4–7.0)
Neutrophils: 61 %
Platelets: 185 10*3/uL (ref 150–379)
RBC: 4.62 x10E6/uL (ref 4.14–5.80)
RDW: 13.9 % (ref 12.3–15.4)
WBC: 6.2 10*3/uL (ref 3.4–10.8)

## 2015-09-16 LAB — FERRITIN: FERRITIN: 233 ng/mL (ref 30–400)

## 2015-09-23 ENCOUNTER — Encounter: Payer: Self-pay | Admitting: Oncology

## 2015-09-23 ENCOUNTER — Other Ambulatory Visit (HOSPITAL_COMMUNITY): Payer: Self-pay | Admitting: *Deleted

## 2015-09-23 ENCOUNTER — Ambulatory Visit (INDEPENDENT_AMBULATORY_CARE_PROVIDER_SITE_OTHER): Payer: BC Managed Care – PPO | Admitting: Oncology

## 2015-09-23 NOTE — Progress Notes (Signed)
Patient ID: Alexander Pollard, male   DOB: [birth year], 65 y.o.   MRN: [id number] Hematology and Oncology Follow Up Visit  Alexander Pollard [id number] 1951/08/30 65 y.o. 09/23/2015 4:44 PM   Principle Diagnosis: Encounter Diagnosis  Name Primary?  . 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 Further evaluation of mild liver function abnormalities revealed significant elevation of serum iron 264, percent saturation 97, and ferritin 1498. CT scan of the abdomen done 01/16/2014 showed overall normal appearance of the liver. Prominent spleen 16 cm. Subsequent ultrasound with elastography done on September 15 showed a normal-size spleen 13 cm. Several small cysts in the liver but no parenchymal mass. Fibrosis score F2 with some areas F3. He was started on a phlebotomy program on 06/11/2014. He had 11 , 500 mL phlebotomies at two-week intervals through 10/16/14 but ferritin did not come down and he was started on a weekly phlebotomy program beginning on February 10. Ferritin began to fall and he was changed to a every 2 week schedule beginning in May, 2016. He has had a steady decrease in his ferritin level data most recent value of 233 on 09/10/2015.  Interim History:    He is tolerating every other week phlebotomies well.  He has had no interim medical problems. He remains active. He is teaching 2 classes a day.  No cardiorespiratory complaints. No GI complaints. His veins are holding up well with the frequent phlebotomies.  Medications: reviewed  Allergies: No Known Allergies  Review of Systems: See interim history Remaining ROS negative:   Physical Exam: Blood pressure 114/74, pulse 76, temperature 98.4 F (36.9 C), temperature source Oral, height 5\' 7"  (1.702 m), weight 190 lb 1.6 oz (86.229 kg), SpO2 98 %. Wt Readings from Last 3 Encounters:  09/23/15 190 lb 1.6 oz (86.229 kg)  07/30/15 185 lb (83.915 kg)  07/16/15  185 lb (83.915 kg)     General appearance:  Well-nourished Caucasian man HENNT: Pharynx no erythema, exudate, mass, or ulcer. No thyromegaly or thyroid nodules Lymph nodes: No cervical, supraclavicular, or axillary lymphadenopathy Breasts:  Lungs: Clear to auscultation, resonant to percussion throughout Heart: Regular rhythm, no murmur, no gallop, no rub, no click, no edema Abdomen: Soft, nontender, normal bowel sounds, no mass, no organomegaly Extremities: No edema, no calf tenderness Musculoskeletal: no joint deformities GU:  Vascular: Carotid pulses 2+, no bruits,  Neurologic: Alert, oriented, PERRLA, cranial nerves grossly normal, motor strength 5 over 5, reflexes 1+ symmetric, upper body coordination normal, gait normal, Skin: No rash or ecchymosis  Lab Results: CBC W/Diff    Component Value Date/Time   WBC 6.2 09/10/2015 1117   WBC 6.2 03/19/2015 0925   RBC 4.62 09/10/2015 1117   RBC 4.80 03/19/2015 0925   HGB 12.4* 09/10/2015 1128   HCT 43.7 09/10/2015 1117   HCT 45.6 03/19/2015 0925   PLT 185 09/10/2015 1117   PLT 170 03/19/2015 0925   MCV 95 09/10/2015 1117   MCV 95.0 03/19/2015 0925   MCH 33.5* 09/10/2015 1117   MCH 33.3 03/19/2015 0925   MCHC 35.5 09/10/2015 1117   MCHC 35.1 03/19/2015 0925   RDW 13.9 09/10/2015 1117   RDW 13.4 03/19/2015 0925   LYMPHSABS 1.6 09/10/2015 1117   LYMPHSABS 1.4 03/19/2015 0925   MONOABS 0.4 03/19/2015 0925   EOSABS 0.2 09/10/2015 1117   EOSABS 0.2 03/19/2015 0925   BASOSABS 0.0 09/10/2015 1117   BASOSABS 0.0 03/19/2015 0925  Chemistry      Component Value Date/Time   NA 141 09/10/2015 1117   NA 142 03/19/2015 0925   K 4.2 09/10/2015 1117   CL 102 09/10/2015 1117   CO2 20 09/10/2015 1117   BUN 14 09/10/2015 1117   BUN 16 03/19/2015 0925   CREATININE 0.94 09/10/2015 1117   CREATININE 0.90 03/19/2015 0925      Component Value Date/Time   CALCIUM 9.1 09/10/2015 1117   ALKPHOS 70 09/10/2015 1117   AST 25 09/10/2015  1117   ALT 21 09/10/2015 1117   BILITOT 1.3* 09/10/2015 1117   BILITOT 1.2 03/19/2015 0925       Radiological Studies: No results found.  Impression:  #1. Homozygous hemochromatosis gene status Responding to phlebotomy with fall in ferritin and normalization of liver functions. I will continue every 2 week phlebotomies until ferritin less than or equal to 150 and then increase intervals in between procedures. I put in new orders to continue through 11/05/2015 then reevaluate. If we reach our target ferritin then  decrease phlebotomies to every 4-6 weeks.  CC: Patient Care Team: Dibas Dorthy Cooler, MD as PCP - General (Family Medicine) [name], MD as Consulting Physician (Oncology) Wilford Corner, MD as Consulting Physician (Gastroenterology) Sueanne Margarita, MD as Consulting Physician (Cardiology) [name], MD as Consulting Physician (Oncology)   [name], MD 1/10/20174:44 PM

## 2015-09-23 NOTE — Patient Instructions (Signed)
Continue every 2 week phlebotomies: 1/11, 1/25, 2/8, 2/22 We will check ferritin again on 2/8 - will likely decrease phlebotomy to every 4-6 weeks after procedure on 11/05/15 MD visit in March

## 2015-09-24 ENCOUNTER — Encounter (HOSPITAL_COMMUNITY)
Admission: RE | Admit: 2015-09-24 | Discharge: 2015-09-24 | Disposition: A | Discharge status: Home / Self Care | Payer: BC Managed Care – PPO | Source: Ambulatory Visit | Attending: Oncology | Admitting: Oncology

## 2015-09-24 LAB — POCT HEMOGLOBIN-HEMACUE: HEMOGLOBIN: 14.4 g/dL (ref 13.0–17.0)

## 2015-09-24 NOTE — Progress Notes (Signed)
Pt came in today for scheduled therapeutic phlebotomy.  His HemoCue prior to arrival was 14.4.  Right AC was used.  500 cc of blood was removed per MD order.  PT tolerated procedure well.

## 2015-10-07 ENCOUNTER — Other Ambulatory Visit (HOSPITAL_COMMUNITY): Payer: Self-pay | Admitting: *Deleted

## 2015-10-08 ENCOUNTER — Encounter (HOSPITAL_COMMUNITY)
Admission: RE | Admit: 2015-10-08 | Discharge: 2015-10-08 | Disposition: A | Discharge status: Home / Self Care | Payer: BC Managed Care – PPO | Source: Ambulatory Visit | Attending: Oncology | Admitting: Oncology

## 2015-10-08 LAB — POCT HEMOGLOBIN-HEMACUE: HEMOGLOBIN: 13.9 g/dL (ref 13.0–17.0)

## 2015-10-08 NOTE — Progress Notes (Signed)
hemocue 13.9 today, phlebotomized 500cc from left AC, pt tolerated procedure well and VSS upon DC home

## 2015-10-21 ENCOUNTER — Other Ambulatory Visit (HOSPITAL_COMMUNITY): Payer: Self-pay | Admitting: *Deleted

## 2015-10-22 ENCOUNTER — Encounter (HOSPITAL_COMMUNITY)
Admission: RE | Admit: 2015-10-22 | Discharge: 2015-10-22 | Disposition: A | Discharge status: Home / Self Care | Payer: BC Managed Care – PPO | Source: Ambulatory Visit | Attending: Oncology | Admitting: Oncology

## 2015-10-22 LAB — COMPREHENSIVE METABOLIC PANEL
ALK PHOS: 66 U/L (ref 38–126)
ALT: 21 U/L (ref 17–63)
AST: 26 U/L (ref 15–41)
Albumin: 4.1 g/dL (ref 3.5–5.0)
Anion gap: 8 (ref 5–15)
BUN: 11 mg/dL (ref 6–20)
CALCIUM: 9.2 mg/dL (ref 8.9–10.3)
CO2: 23 mmol/L (ref 22–32)
CREATININE: 0.92 mg/dL (ref 0.61–1.24)
Chloride: 110 mmol/L (ref 101–111)
GFR calc non Af Amer: >60 mL/min (ref >60)
Glucose, Bld: 100 mg/dL — ABNORMAL HIGH (ref 65–99)
Potassium: 4.3 mmol/L (ref 3.5–5.1)
Sodium: 141 mmol/L (ref 135–145)
TOTAL PROTEIN: 7 g/dL (ref 6.5–8.1)
Total Bilirubin: 1.7 mg/dL — ABNORMAL HIGH (ref 0.3–1.2)

## 2015-10-22 LAB — POCT HEMOGLOBIN-HEMACUE: Hemoglobin: 15.4 g/dL (ref 13.0–17.0)

## 2015-10-22 LAB — FERRITIN: FERRITIN: 117 ng/mL (ref 24–336)

## 2015-10-22 NOTE — Progress Notes (Signed)
hemocue result today was 15.4.  Phlebotomized 500cc today from right arm antecubital.  VSS prior to discharge home and pt tolerated procedure well.

## 2015-10-23 ENCOUNTER — Telehealth: Payer: Self-pay | Admitting: *Deleted

## 2015-10-23 NOTE — Telephone Encounter (Signed)
Pt called - no answer; left message Ferritin level now in normal range @ 117 per Dr Beryle Beams. And to call if he has any questions.

## 2015-10-23 NOTE — Telephone Encounter (Signed)
-----   Message from [name], MD sent at 10/22/2015  3:28 PM EST ----- Call pt: Alexander Pollard now in normal range @ 117

## 2016-05-28 ENCOUNTER — Other Ambulatory Visit: Payer: Self-pay

## 2016-05-28 ENCOUNTER — Other Ambulatory Visit: Payer: Self-pay | Admitting: Nurse Practitioner

## 2016-05-28 DIAGNOSIS — I10 Essential (primary) hypertension: Secondary | ICD-10-CM

## 2016-05-28 MED ORDER — METOPROLOL SUCCINATE ER 25 MG PO TB24: 25.0000 mg | ORAL_TABLET | Freq: Every day | ORAL | 1 refills | Status: DC

## 2016-05-31 ENCOUNTER — Other Ambulatory Visit: Payer: Self-pay | Admitting: Nurse Practitioner

## 2016-05-31 DIAGNOSIS — I10 Essential (primary) hypertension: Secondary | ICD-10-CM

## 2016-07-20 ENCOUNTER — Other Ambulatory Visit: Payer: Self-pay | Admitting: Nurse Practitioner

## 2016-07-20 DIAGNOSIS — I10 Essential (primary) hypertension: Secondary | ICD-10-CM

## 2016-07-20 NOTE — Telephone Encounter (Signed)
Left message for patient to call the office and schedule a follow-up with Dr. Radford Pax or her assistant to receive further refills.

## 2016-07-27 ENCOUNTER — Encounter: Payer: Self-pay | Admitting: Physician Assistant

## 2016-07-27 ENCOUNTER — Ambulatory Visit (INDEPENDENT_AMBULATORY_CARE_PROVIDER_SITE_OTHER): Payer: Medicare Other | Admitting: Physician Assistant

## 2016-07-27 VITALS — BP 120/82 | HR 66 | Ht 67.0 in | Wt 187.0 lb

## 2016-07-27 DIAGNOSIS — E785 Hyperlipidemia, unspecified: Secondary | ICD-10-CM | POA: Diagnosis not present

## 2016-07-27 DIAGNOSIS — Z8249 Family history of ischemic heart disease and other diseases of the circulatory system: Secondary | ICD-10-CM | POA: Diagnosis not present

## 2016-07-27 DIAGNOSIS — I77810 Thoracic aortic ectasia: Secondary | ICD-10-CM

## 2016-07-27 DIAGNOSIS — I10 Essential (primary) hypertension: Secondary | ICD-10-CM | POA: Diagnosis not present

## 2016-07-27 DIAGNOSIS — I7789 Other specified disorders of arteries and arterioles: Secondary | ICD-10-CM | POA: Diagnosis not present

## 2016-07-27 NOTE — Progress Notes (Addendum)
Cardiology Office Note    Date:  07/27/2016  ID:  Alexander Pollard, DOB Nov 30, 1950, MRN [id number] PCP:  Lujean Amel, MD  Cardiologist:  Radford Pax   Chief Complaint: overdue f/u for management of BP and risk factors  History of Present Illness:  Alexander Pollard is a 65 y.o. male with history of hemachromatosis, hypertensive response to exercise, low HDL, mildly dilated aortic root, diastolic dysfunction and family history of CAD who presents for follow-up. Prior cardiac testing includes 2D echo 07/02/15: EF 65-70%, no RWMA, grade 1 DD, mildly dilated aortic root (42) and ascending aorta (41). He underwent ETT 06/2015 due to atypical chest pain - had hypertensive response to exercise up to 195/107, prompting addition of Toprol. Nuc 07/2015 was normal, EF 60%. Ambulatory BP monitor 07/2015 was normal. Labs 10/2015: K 4.3, Cr 0.92, Hgb 15.4.  He presents back to clinic today feeling great. He runs and bikes regularly outdoors. States he ran more this year than he ever has. No chest pain, SOB, palpitations, LEE, orthopnea. Although his family history of CAD was significant, he does report these family members had terrible habits including smoking and sedentary lifestyle. He follows his BP at the gym - highest readings during exercise have been in the 140 range. BP is well controlled today.    Past Medical History:  Diagnosis Date  . Dilated aortic root ([place])   . Dyslipidemia    a. h/o low HDL.  . Enlarged aorta ([place])   . Family history of early CAD   . 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 Marked hypertensive response to exercise by ETT 06/2016 prompting addition of Toprol. b. F/u amb BP monitor normal.    Past Surgical History:  Procedure Laterality Date  . COLONOSCOPY     next due 08/2017    Current Medications: Current Outpatient Prescriptions  Medication Sig Dispense Refill  . aspirin  EC 81 MG tablet Take 81 mg by mouth daily.    . metoprolol succinate (TOPROL XL) 25 MG 24 hr tablet Take 1 tablet (25 mg total) by mouth daily. 30 tablet 1   No current facility-administered medications for this visit.      Allergies:   Patient has no known allergies.   Social History   Social History  . Marital status: Divorced    Spouse name: N/A  . Number of children: N/A  . Years of education: N/A   Social History Main Topics  . Smoking status: Former Research scientist (life sciences)  . Smokeless tobacco: None  . Alcohol use 0.0 oz/week     Comment: Beer weekly.  . Drug use: No  . Sexual activity: Not Asked   Other Topics Concern  . None   Social History Narrative  . None     Family History:  The patient's family history includes Heart attack in his mother; Heart disease in his brother and mother; Leukemia in his brother; Lymphoma in his brother.   ROS:   Please see the history of present illness.  All other systems are reviewed and otherwise negative.    PHYSICAL EXAM:   VS:  BP 120/82 (BP Location: Left Arm, Patient Position: Sitting, Cuff Size: Normal)   Pulse 66   Ht 5\' 7"  (1.702 m)   Wt 187 lb (84.8 kg)   BMI 29.29 kg/m   BMI: Body mass index is 29.29 kg/m. GEN: Well nourished, well developed  WM in no acute distress  Fit appearing HEENT: normocephalic, atraumatic Neck: no JVD, carotid bruits, or masses Cardiac: RRR; no murmurs, rubs, or gallops, no edema  Respiratory:  clear to auscultation bilaterally, normal work of breathing GI: soft, nontender, nondistended, + BS MS: no deformity or atrophy  Skin: warm and dry, no rash Neuro:  Alert and Oriented x 3, Strength and sensation are intact, follows commands Psych: euthymic mood, full affect  Wt Readings from Last 3 Encounters:  07/27/16 187 lb (84.8 kg)  09/23/15 190 lb 1.6 oz (86.2 kg)  07/30/15 185 lb (83.9 kg)      Studies/Labs Reviewed:   EKG:  EKG was ordered today and personally reviewed by me and demonstrates NSR  66bpm, no acute ST-T changes.       Recent Labs: 09/10/2015: Platelets 185 10/22/2015: ALT 21; BUN 11; Creatinine, Ser 0.92; Hemoglobin 15.4; Potassium 4.3; Sodium 141   Lipid Panel No results found for: CHOL, TRIG, HDL, CHOLHDL, VLDL, LDLCALC, LDLDIRECT  Additional studies/ records that were reviewed today include: Summarized above.    ASSESSMENT & PLAN:   1. 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 He says he's actually skeptical of the readings during his ETT. He is tolerating BB therapy without any complication and feels great. Will continue. 2. Dilated aortic root and ascending aorta - will f/u by echocardiogram.  3. Dyslipidemia - he states this is followed by primary care regularly. Would encourage tight lipid control given his family history. 4. Family history of CAD - continue surveillance for any anginal symptoms. Negative nuc in 2016. Continue daily aspirin.  Disposition: F/u with Dr. Radford Pax in 1 year.   Medication Adjustments/Labs and Tests Ordered: Current medicines are reviewed at length with the patient today.  Concerns regarding medicines are outlined above. Medication changes, Labs and Tests ordered today are summarized above and listed in the Patient Instructions accessible in Encounters.   Raechel Ache PA-C  07/27/2016 4:10 PM    [facility] [place], [place], [place]  [address] Phone: [phone]; Fax: [phone]

## 2016-07-27 NOTE — Patient Instructions (Addendum)
Medication Instructions:  Your physician recommends that you continue on your current medications as directed. Please refer to the Current Medication list given to you today.  Labwork: NONE  Testing/Procedures: Your physician has requested that you have an echocardiogram. Echocardiography is a painless test that uses sound waves to create images of your heart. It provides your doctor with information about the size and shape of your heart and how well your heart's chambers and valves are working. This procedure takes approximately one hour. There are no restrictions for this procedure.  Follow-Up: Your physician wants you to follow-up in: 12 months with Dr. Radford Pax. You will receive a reminder letter in the mail two months in advance. If you don't receive a letter, please call our office to schedule the follow-up appointment.   If you need a refill on your cardiac medications before your next appointment, please call your pharmacy.

## 2016-08-19 ENCOUNTER — Ambulatory Visit (HOSPITAL_COMMUNITY): Payer: Medicare Other | Attending: Cardiology

## 2016-08-19 ENCOUNTER — Other Ambulatory Visit: Payer: Self-pay

## 2016-08-19 DIAGNOSIS — Z8249 Family history of ischemic heart disease and other diseases of the circulatory system: Secondary | ICD-10-CM | POA: Diagnosis not present

## 2016-08-19 DIAGNOSIS — I77810 Thoracic aortic ectasia: Secondary | ICD-10-CM | POA: Insufficient documentation

## 2016-08-19 DIAGNOSIS — E785 Hyperlipidemia, unspecified: Secondary | ICD-10-CM | POA: Insufficient documentation

## 2016-08-19 DIAGNOSIS — I7789 Other specified disorders of arteries and arterioles: Secondary | ICD-10-CM | POA: Diagnosis not present

## 2016-08-20 ENCOUNTER — Telehealth: Payer: Self-pay | Admitting: Physician Assistant

## 2016-08-20 NOTE — Telephone Encounter (Signed)
Returned pts call.  He has been made aware of his echo results and he verbalized understanding.

## 2016-08-20 NOTE — Telephone Encounter (Signed)
-----   Message from [name], [place] sent at 08/20/2016  7:41 AM EST ----- Please let patient know echo looks good. Normal heart function. Not significantly changed from prior. Still with mild diastolic dysfunction (can be related to age/HTN), also mildly dilated aortic root that appears stable. This is why blood pressure control is important - looked great at last OV. Continue current regimen. Dayna Dunn PA-C

## 2016-08-20 NOTE — Telephone Encounter (Signed)
New message   Pt verbalized that he is returning call for [employer]

## 2016-08-23 ENCOUNTER — Other Ambulatory Visit: Payer: Self-pay | Admitting: Physician Assistant

## 2016-08-23 ENCOUNTER — Other Ambulatory Visit (HOSPITAL_COMMUNITY): Payer: Self-pay | Admitting: *Deleted

## 2016-08-23 DIAGNOSIS — I10 Essential (primary) hypertension: Secondary | ICD-10-CM

## 2016-08-23 MED ORDER — METOPROLOL SUCCINATE ER 25 MG PO TB24: 25.0000 mg | ORAL_TABLET | Freq: Every day | ORAL | 3 refills | Status: DC

## 2016-09-01 NOTE — Telephone Encounter (Signed)
metoprolol succinate (TOPROL XL) 25 MG 24 hr tablet  Medication  Date: 08/23/2016 Department: [address] Office Ordering/Authorizing: [name], PA-C  Order Providers   Prescribing Provider Encounter Provider  [name], PA-C [name], PA-C  Medication Detail    Disp Refills Start End   metoprolol succinate (TOPROL XL) 25 MG 24 hr tablet 90 tablet 3 08/23/2016    Sig - Route: Take 1 tablet (25 mg total) by mouth daily. - Oral   E-Prescribing Status: Receipt confirmed by pharmacy (08/23/2016 3:53 PM EST)   Associated Diagnoses   Essential hypertension     Pharmacy   [id number] - [place], [place]       Pt was here 07/2016 to see Melina Copa.

## 2016-12-07 DIAGNOSIS — L814 Other melanin hyperpigmentation: Secondary | ICD-10-CM | POA: Diagnosis not present

## 2016-12-07 DIAGNOSIS — L821 Other seborrheic keratosis: Secondary | ICD-10-CM | POA: Diagnosis not present

## 2016-12-07 DIAGNOSIS — D485 Neoplasm of uncertain behavior of skin: Secondary | ICD-10-CM | POA: Diagnosis not present

## 2016-12-07 DIAGNOSIS — D225 Melanocytic nevi of trunk: Secondary | ICD-10-CM | POA: Diagnosis not present

## 2016-12-14 DIAGNOSIS — M25551 Pain in right hip: Secondary | ICD-10-CM | POA: Diagnosis not present

## 2016-12-14 DIAGNOSIS — M25561 Pain in right knee: Secondary | ICD-10-CM | POA: Diagnosis not present

## 2017-05-30 DIAGNOSIS — I10 Essential (primary) hypertension: Secondary | ICD-10-CM | POA: Diagnosis not present

## 2017-07-25 ENCOUNTER — Encounter: Payer: Self-pay | Admitting: Physician Assistant

## 2017-07-25 ENCOUNTER — Ambulatory Visit (INDEPENDENT_AMBULATORY_CARE_PROVIDER_SITE_OTHER): Payer: PPO | Admitting: Physician Assistant

## 2017-07-25 VITALS — BP 136/98 | HR 74 | Ht 68.0 in | Wt 192.0 lb

## 2017-07-25 DIAGNOSIS — I10 Essential (primary) hypertension: Secondary | ICD-10-CM | POA: Diagnosis not present

## 2017-07-25 DIAGNOSIS — Z8249 Family history of ischemic heart disease and other diseases of the circulatory system: Secondary | ICD-10-CM | POA: Diagnosis not present

## 2017-07-25 DIAGNOSIS — I77810 Thoracic aortic ectasia: Secondary | ICD-10-CM | POA: Diagnosis not present

## 2017-07-25 DIAGNOSIS — E785 Hyperlipidemia, unspecified: Secondary | ICD-10-CM | POA: Diagnosis not present

## 2017-07-25 NOTE — Progress Notes (Signed)
Cardiology Office Note    Date:  07/25/2017   ID:  [name], DOB 12-May-1951, MRN [id number]  PCP:  Alexander Amel, MD  Cardiologist: Dr. Fransico Him  Chief Complaint  Patient presents with  . Follow-up    History of Present Illness:  Alexander Pollard is a 66 y.o. male with history of hemachromatosis, hypertensive response to exercise, low HDL, mildly dilated aortic root, diastolic dysfunction and family history of CAD. 2D echo 08/19/16: Normal LVEF 55-60%, with grade 1 DD mildly dilated aortic root (39) and ascending aorta (37). He underwent ETT 06/2015 due to atypical chest pain - had hypertensive response to exercise up to 195/107, prompting addition of Toprol. Nuc 07/2015 was normal, EF 60%. Ambulatory BP monitor 07/2015 was normal.  Last saw Alexander Copa, PA-C 08/2016 and was doing well.  Patient comes in for yearly f/u. Goes to gym 4 days a week and checks his blood pressure before and after exercise.  It is always normal.  His blood pressure is elevated today which surprised him.  He did eat [place] food last night and had 2 beers.  He denies any chest pain, palpitations, dyspnea, dyspnea on exertion, dizziness or presyncope.  He is not able to run as much because he is having some hip problems.  His primary care checks his lipids every year but we do not have these results.  He is not on any statins.       Past Medical History:  Diagnosis Date  . Dilated aortic root ([place])   . Dyslipidemia    a. h/o low HDL.  . Enlarged aorta ([place])   . Family history of early CAD   . 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 Marked hypertensive response to exercise by ETT 06/2016 prompting addition of Toprol. b. F/u amb BP monitor normal.    Past Surgical History:  Procedure Laterality Date  . COLONOSCOPY     next due 08/2017    Current Medications: Current Meds  Medication Sig  .  aspirin EC 81 MG tablet Take 81 mg by mouth daily.  . meloxicam (MOBIC) 15 MG tablet Take 15 mg daily by mouth. with food  . metoprolol succinate (TOPROL XL) 25 MG 24 hr tablet Take 1 tablet (25 mg total) by mouth daily.     Allergies:   Patient has no known allergies.   Social History   Socioeconomic History  . Marital status: Divorced    Spouse name: None  . Number of children: None  . Years of education: None  . Highest education level: None  Social Needs  . Financial resource strain: None  . Food insecurity - worry: None  . Food insecurity - inability: None  . Transportation needs - medical: None  . Transportation needs - non-medical: None  Occupational History  . None  Tobacco Use  . Smoking status: Former Research scientist (life sciences)  . Smokeless tobacco: Never Used  Substance and Sexual Activity  . Alcohol use: Yes    Alcohol/week: 0.0 oz    Comment: Beer weekly.  . Drug use: No  . Sexual activity: None  Other Topics Concern  . None  Social History Narrative  . None     Family History:  The patient's family history includes Heart attack in his mother; Heart disease in his brother and mother; Leukemia in his brother; Lymphoma in his brother.  ROS:   Please see the history of present illness.    Review of Systems  Constitution: Negative.  HENT: Negative.   Cardiovascular: Negative.   Respiratory: Negative.   Endocrine: Negative.   Hematologic/Lymphatic: Bruises/bleeds easily.  Musculoskeletal: Positive for joint pain.  Gastrointestinal: Negative.   Genitourinary: Negative.   Neurological: Negative.    All other systems reviewed and are negative.   PHYSICAL EXAM:   VS:  BP (!) 136/98 (BP Location: Right Arm, Patient Position: Sitting)   Pulse 74   Ht 5\' 8"  (1.727 m)   Wt 192 lb (87.1 kg)   SpO2 97%   BMI 29.19 kg/m   Physical Exam  GEN: Well nourished, well developed, in no acute distress  Neck: no JVD, carotid bruits, or masses Cardiac:RRR; no murmurs, rubs, or  gallops  Respiratory:  clear to auscultation bilaterally, normal work of breathing GI: soft, nontender, nondistended, + BS Ext: without cyanosis, clubbing, or edema, Good distal pulses bilaterally Neuro:  Alert and Oriented x 3 Psych: euthymic mood, full affect  Wt Readings from Last 3 Encounters:  07/25/17 192 lb (87.1 kg)  07/27/16 187 lb (84.8 kg)  09/23/15 190 lb 1.6 oz (86.2 kg)      Studies/Labs Reviewed:   EKG:  EKG is  ordered today.  The ekg ordered today demonstrates normal sinus rhythm at 74 bpm without acute change  Recent Labs: No results found for requested labs within last 8760 hours.   Lipid Panel No results found for: CHOL, TRIG, HDL, CHOLHDL, VLDL, LDLCALC, LDLDIRECT  Additional studies/ records that were reviewed today include:  2D echo 12/7/17Study Conclusions   - Left ventricle: The cavity size was normal. Wall thickness was   normal. Systolic function was normal. The estimated ejection   fraction was in the range of 55% to 60%. Wall motion was normal;   there were no regional wall motion abnormalities. Doppler   parameters are consistent with abnormal left ventricular   relaxation (grade 1 diastolic dysfunction). - Aortic valve: There was no stenosis. - Aorta: Mildly dilated aortic root. Aortic root dimension: 39 mm   (ED). - Mitral valve: There was no significant regurgitation. - Right ventricle: The cavity size was normal. Systolic function   was normal. - Tricuspid valve: Peak RV-RA gradient (S): 22 mm Hg. - Pulmonary arteries: PA peak pressure: 25 mm Hg (S). - Inferior vena cava: The vessel was normal in size. The   respirophasic diameter changes were in the normal range (= 50%),   consistent with normal central venous pressure.   Impressions:   - Normal LV size and systolic function, EF 95-28%. Normal RV size   and systolic function. No significant valvular abnormalities.   Mildly enlarged aortic root.       ASSESSMENT:    1.  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 Dilated aortic root ([place])   3. Dyslipidemia   4. Family history of early CAD      PLAN:  In order of problems listed above:  Hypertensive response to exercise blood pressure has been well controlled on metoprolol 25 mg daily.  It is elevated today but he had [place] food last night.  He checks it regularly at the gym and it has been normal.  Recommend 2 g sodium diet.  Call us if his blood pressures stay elevated.  Dilated aortic root stable on echo 1 year ago.  Recommend repeat echo next summer and follow-up with Dr. Radford Pax in November 2019.  Dyslipidemia managed  by primary care, I asked patient to obtain a copy of his most recent lipid panel.  Family history of CAD risk factor modification necessary.    Medication Adjustments/Labs and Tests Ordered: Current medicines are reviewed at length with the patient today.  Concerns regarding medicines are outlined above.  Medication changes, Labs and Tests ordered today are listed in the Patient Instructions below. Patient Instructions        Signed, Ermalinda Barrios, PA-C  07/25/2017 8:35 AM    [facility] [place], [place], [place]  [address] Phone: [phone]; Fax: [phone]

## 2017-07-25 NOTE — Patient Instructions (Addendum)
Medication Instructions: Your physician recommends that you continue on your current medications as directed. Please refer to the Current Medication list given to you today.  Labwork: None Ordered  Procedures/Testing: Your physician has requested that you have an echocardiogram - Summer 2019. Echocardiography is a painless test that uses sound waves to create images of your heart. It provides your doctor with information about the size and shape of your heart and how well your heart's chambers and valves are working. This procedure takes approximately one hour. There are no restrictions for this procedure.  Follow-Up: Your physician wants you to follow-up in: November 2019 with Dr. Radford Pax.  You will receive a reminder letter in the mail two months in advance. If you don't receive a letter, please call our office to schedule the follow-up appointment.   If you need a refill on your cardiac medications before your next appointment, please call your pharmacy.

## 2017-07-29 ENCOUNTER — Ambulatory Visit: Payer: Medicare Other | Admitting: Cardiology

## 2017-08-15 ENCOUNTER — Other Ambulatory Visit: Payer: Self-pay | Admitting: *Deleted

## 2017-08-15 DIAGNOSIS — I10 Essential (primary) hypertension: Secondary | ICD-10-CM

## 2017-08-15 MED ORDER — METOPROLOL SUCCINATE ER 25 MG PO TB24: 25.0000 mg | ORAL_TABLET | Freq: Every day | ORAL | 3 refills | Status: DC

## 2017-08-24 DIAGNOSIS — M25551 Pain in right hip: Secondary | ICD-10-CM | POA: Diagnosis not present

## 2017-08-24 DIAGNOSIS — Z125 Encounter for screening for malignant neoplasm of prostate: Secondary | ICD-10-CM | POA: Diagnosis not present

## 2017-08-24 DIAGNOSIS — Z0000 Encounter for general adult medical examination without abnormal findings: Secondary | ICD-10-CM | POA: Diagnosis not present

## 2017-08-24 DIAGNOSIS — I10 Essential (primary) hypertension: Secondary | ICD-10-CM | POA: Diagnosis not present

## 2017-08-24 DIAGNOSIS — Z136 Encounter for screening for cardiovascular disorders: Secondary | ICD-10-CM | POA: Diagnosis not present

## 2017-08-24 DIAGNOSIS — Z79899 Other long term (current) drug therapy: Secondary | ICD-10-CM | POA: Diagnosis not present

## 2017-08-24 DIAGNOSIS — R7301 Impaired fasting glucose: Secondary | ICD-10-CM | POA: Diagnosis not present

## 2017-08-24 DIAGNOSIS — Z23 Encounter for immunization: Secondary | ICD-10-CM | POA: Diagnosis not present

## 2017-08-26 ENCOUNTER — Other Ambulatory Visit: Payer: Self-pay | Admitting: Family Medicine

## 2017-08-26 DIAGNOSIS — Z136 Encounter for screening for cardiovascular disorders: Secondary | ICD-10-CM

## 2017-09-01 ENCOUNTER — Ambulatory Visit
Admission: RE | Admit: 2017-09-01 | Discharge: 2017-09-01 | Disposition: A | Discharge status: Home / Self Care | Payer: PPO | Source: Ambulatory Visit | Attending: Family Medicine | Admitting: Family Medicine

## 2017-09-01 DIAGNOSIS — Z136 Encounter for screening for cardiovascular disorders: Secondary | ICD-10-CM | POA: Diagnosis not present

## 2017-09-01 DIAGNOSIS — Z87891 Personal history of nicotine dependence: Secondary | ICD-10-CM | POA: Diagnosis not present

## 2017-09-19 ENCOUNTER — Ambulatory Visit: Payer: Medicare Other | Admitting: Cardiology

## 2017-10-28 DIAGNOSIS — K640 First degree hemorrhoids: Secondary | ICD-10-CM | POA: Diagnosis not present

## 2017-10-28 DIAGNOSIS — Z1211 Encounter for screening for malignant neoplasm of colon: Secondary | ICD-10-CM | POA: Diagnosis not present

## 2017-12-06 ENCOUNTER — Other Ambulatory Visit: Payer: Self-pay | Admitting: Internal Medicine

## 2017-12-07 DIAGNOSIS — D692 Other nonthrombocytopenic purpura: Secondary | ICD-10-CM | POA: Diagnosis not present

## 2017-12-07 DIAGNOSIS — L814 Other melanin hyperpigmentation: Secondary | ICD-10-CM | POA: Diagnosis not present

## 2017-12-07 DIAGNOSIS — D2221 Melanocytic nevi of right ear and external auricular canal: Secondary | ICD-10-CM | POA: Diagnosis not present

## 2017-12-07 DIAGNOSIS — L821 Other seborrheic keratosis: Secondary | ICD-10-CM | POA: Diagnosis not present

## 2017-12-07 DIAGNOSIS — D225 Melanocytic nevi of trunk: Secondary | ICD-10-CM | POA: Diagnosis not present

## 2017-12-07 DIAGNOSIS — L570 Actinic keratosis: Secondary | ICD-10-CM | POA: Diagnosis not present

## 2017-12-27 DIAGNOSIS — J0100 Acute maxillary sinusitis, unspecified: Secondary | ICD-10-CM | POA: Diagnosis not present

## 2018-01-08 DIAGNOSIS — B348 Other viral infections of unspecified site: Secondary | ICD-10-CM | POA: Diagnosis not present

## 2018-01-09 ENCOUNTER — Other Ambulatory Visit: Payer: Self-pay

## 2018-01-09 ENCOUNTER — Encounter (HOSPITAL_COMMUNITY): Admission: EM | Disposition: A | Discharge status: Home Health | Payer: Self-pay | Source: Home / Self Care

## 2018-01-09 ENCOUNTER — Emergency Department (HOSPITAL_COMMUNITY): Payer: PPO

## 2018-01-09 ENCOUNTER — Encounter (HOSPITAL_COMMUNITY): Payer: Self-pay | Admitting: *Deleted

## 2018-01-09 ENCOUNTER — Inpatient Hospital Stay (HOSPITAL_COMMUNITY)
Admission: EM | Admit: 2018-01-09 | Discharge: 2018-01-23 | DRG: 329 | Disposition: A | Discharge status: Home Health | Payer: PPO | Attending: General Surgery | Admitting: General Surgery

## 2018-01-09 DIAGNOSIS — K6819 Other retroperitoneal abscess: Secondary | ICD-10-CM | POA: Diagnosis present

## 2018-01-09 DIAGNOSIS — J811 Chronic pulmonary edema: Secondary | ICD-10-CM | POA: Diagnosis not present

## 2018-01-09 DIAGNOSIS — I10 Essential (primary) hypertension: Secondary | ICD-10-CM | POA: Diagnosis present

## 2018-01-09 DIAGNOSIS — R600 Localized edema: Secondary | ICD-10-CM | POA: Diagnosis not present

## 2018-01-09 DIAGNOSIS — I77810 Thoracic aortic ectasia: Secondary | ICD-10-CM | POA: Diagnosis present

## 2018-01-09 DIAGNOSIS — J9690 Respiratory failure, unspecified, unspecified whether with hypoxia or hypercapnia: Secondary | ICD-10-CM | POA: Diagnosis not present

## 2018-01-09 DIAGNOSIS — R601 Generalized edema: Secondary | ICD-10-CM | POA: Diagnosis not present

## 2018-01-09 DIAGNOSIS — E785 Hyperlipidemia, unspecified: Secondary | ICD-10-CM | POA: Diagnosis present

## 2018-01-09 DIAGNOSIS — Z9889 Other specified postprocedural states: Secondary | ICD-10-CM

## 2018-01-09 DIAGNOSIS — Z87891 Personal history of nicotine dependence: Secondary | ICD-10-CM | POA: Diagnosis not present

## 2018-01-09 DIAGNOSIS — K631 Perforation of intestine (nontraumatic): Principal | ICD-10-CM | POA: Diagnosis present

## 2018-01-09 DIAGNOSIS — Z791 Long term (current) use of non-steroidal anti-inflammatories (NSAID): Secondary | ICD-10-CM | POA: Diagnosis not present

## 2018-01-09 DIAGNOSIS — R339 Retention of urine, unspecified: Secondary | ICD-10-CM | POA: Diagnosis not present

## 2018-01-09 DIAGNOSIS — J90 Pleural effusion, not elsewhere classified: Secondary | ICD-10-CM | POA: Diagnosis not present

## 2018-01-09 DIAGNOSIS — Z4682 Encounter for fitting and adjustment of non-vascular catheter: Secondary | ICD-10-CM | POA: Diagnosis not present

## 2018-01-09 DIAGNOSIS — R509 Fever, unspecified: Secondary | ICD-10-CM | POA: Diagnosis not present

## 2018-01-09 DIAGNOSIS — K659 Peritonitis, unspecified: Secondary | ICD-10-CM | POA: Diagnosis not present

## 2018-01-09 DIAGNOSIS — T8132XA Disruption of internal operation (surgical) wound, not elsewhere classified, initial encounter: Secondary | ICD-10-CM | POA: Diagnosis not present

## 2018-01-09 DIAGNOSIS — I739 Peripheral vascular disease, unspecified: Secondary | ICD-10-CM | POA: Diagnosis present

## 2018-01-09 DIAGNOSIS — S31109A Unspecified open wound of abdominal wall, unspecified quadrant without penetration into peritoneal cavity, initial encounter: Secondary | ICD-10-CM | POA: Diagnosis not present

## 2018-01-09 DIAGNOSIS — R0602 Shortness of breath: Secondary | ICD-10-CM | POA: Diagnosis not present

## 2018-01-09 DIAGNOSIS — J810 Acute pulmonary edema: Secondary | ICD-10-CM

## 2018-01-09 DIAGNOSIS — K567 Ileus, unspecified: Secondary | ICD-10-CM | POA: Diagnosis not present

## 2018-01-09 DIAGNOSIS — Z7982 Long term (current) use of aspirin: Secondary | ICD-10-CM

## 2018-01-09 DIAGNOSIS — K3532 Acute appendicitis with perforation and localized peritonitis, without abscess: Secondary | ICD-10-CM | POA: Diagnosis present

## 2018-01-09 DIAGNOSIS — Z8249 Family history of ischemic heart disease and other diseases of the circulatory system: Secondary | ICD-10-CM | POA: Diagnosis not present

## 2018-01-09 DIAGNOSIS — I77819 Aortic ectasia, unspecified site: Secondary | ICD-10-CM | POA: Diagnosis not present

## 2018-01-09 DIAGNOSIS — R1031 Right lower quadrant pain: Secondary | ICD-10-CM | POA: Diagnosis present

## 2018-01-09 DIAGNOSIS — K3533 Acute appendicitis with perforation and localized peritonitis, with abscess: Secondary | ICD-10-CM | POA: Diagnosis not present

## 2018-01-09 DIAGNOSIS — R630 Anorexia: Secondary | ICD-10-CM | POA: Diagnosis not present

## 2018-01-09 DIAGNOSIS — R079 Chest pain, unspecified: Secondary | ICD-10-CM | POA: Diagnosis not present

## 2018-01-09 DIAGNOSIS — J9600 Acute respiratory failure, unspecified whether with hypoxia or hypercapnia: Secondary | ICD-10-CM | POA: Diagnosis not present

## 2018-01-09 DIAGNOSIS — Z452 Encounter for adjustment and management of vascular access device: Secondary | ICD-10-CM

## 2018-01-09 DIAGNOSIS — R109 Unspecified abdominal pain: Secondary | ICD-10-CM | POA: Diagnosis not present

## 2018-01-09 HISTORY — PX: LAPAROTOMY: SHX154

## 2018-01-09 LAB — URINALYSIS, ROUTINE W REFLEX MICROSCOPIC
BACTERIA UA: NONE SEEN
Bilirubin Urine: NEGATIVE
Glucose, UA: 50 mg/dL — AB
HGB URINE DIPSTICK: NEGATIVE
KETONES UR: 5 mg/dL — AB
Leukocytes, UA: NEGATIVE
Nitrite: NEGATIVE
PROTEIN: 100 mg/dL — AB
Specific Gravity, Urine: 1.025 (ref 1.005–1.030)
pH: 5 (ref 5.0–8.0)

## 2018-01-09 LAB — CBC
HEMATOCRIT: 45.1 % (ref 39.0–52.0)
Hemoglobin: 16.2 g/dL (ref 13.0–17.0)
MCH: 32.5 pg (ref 26.0–34.0)
MCHC: 35.9 g/dL (ref 30.0–36.0)
MCV: 90.6 fL (ref 78.0–100.0)
Platelets: 512 10*3/uL — ABNORMAL HIGH (ref 150–400)
RBC: 4.98 MIL/uL (ref 4.22–5.81)
RDW: 12.4 % (ref 11.5–15.5)
WBC: 44.3 10*3/uL — ABNORMAL HIGH (ref 4.0–10.5)

## 2018-01-09 LAB — COMPREHENSIVE METABOLIC PANEL
ALBUMIN: 3 g/dL — AB (ref 3.5–5.0)
ALT: 41 U/L (ref 17–63)
AST: 41 U/L (ref 15–41)
Alkaline Phosphatase: 95 U/L (ref 38–126)
Anion gap: 19 — ABNORMAL HIGH (ref 5–15)
BUN: 18 mg/dL (ref 6–20)
CHLORIDE: 89 mmol/L — AB (ref 101–111)
CO2: 19 mmol/L — AB (ref 22–32)
Calcium: 9.1 mg/dL (ref 8.9–10.3)
Creatinine, Ser: 1.46 mg/dL — ABNORMAL HIGH (ref 0.61–1.24)
GFR calc Af Amer: 56 mL/min — ABNORMAL LOW (ref >60)
GFR calc non Af Amer: 48 mL/min — ABNORMAL LOW (ref >60)
GLUCOSE: 276 mg/dL — AB (ref 65–99)
POTASSIUM: 3.9 mmol/L (ref 3.5–5.1)
SODIUM: 127 mmol/L — AB (ref 135–145)
Total Bilirubin: 2.7 mg/dL — ABNORMAL HIGH (ref 0.3–1.2)
Total Protein: 7.6 g/dL (ref 6.5–8.1)

## 2018-01-09 LAB — LACTIC ACID, PLASMA: Lactic Acid, Venous: 4.7 mmol/L (ref 0.5–1.9)

## 2018-01-09 LAB — LIPASE, BLOOD: LIPASE: 35 U/L (ref 11–51)

## 2018-01-09 SURGERY — LAPAROTOMY, EXPLORATORY
Anesthesia: General | Site: Abdomen

## 2018-01-09 MED ORDER — PROPOFOL 10 MG/ML IV BOLUS
INTRAVENOUS | Status: AC
  Filled 2018-01-09: qty 20

## 2018-01-09 MED ORDER — PIPERACILLIN-TAZOBACTAM 3.375 G IVPB
3.3750 g | Freq: Three times a day (TID) | INTRAVENOUS | Status: DC
  Filled 2018-01-09: qty 50

## 2018-01-09 MED ORDER — MIDAZOLAM HCL 2 MG/2ML IJ SOLN
INTRAMUSCULAR | Status: AC
  Filled 2018-01-09: qty 2

## 2018-01-09 MED ORDER — SODIUM CHLORIDE 0.9 % IV BOLUS
1000.0000 mL | Freq: Once | INTRAVENOUS | Status: AC
  Administered 2018-01-09: 1000 mL via INTRAVENOUS

## 2018-01-09 MED ORDER — LACTATED RINGERS IV BOLUS
1000.0000 mL | Freq: Once | INTRAVENOUS | Status: AC
  Administered 2018-01-09: 1000 mL via INTRAVENOUS

## 2018-01-09 MED ORDER — 0.9 % SODIUM CHLORIDE (POUR BTL) OPTIME
TOPICAL | Status: DC | PRN
  Administered 2018-01-10 (×11): 1000 mL

## 2018-01-09 MED ORDER — ONDANSETRON HCL 4 MG/2ML IJ SOLN
4.0000 mg | Freq: Once | INTRAMUSCULAR | Status: AC | PRN
  Administered 2018-01-09: 4 mg via INTRAVENOUS
  Filled 2018-01-09: qty 2

## 2018-01-09 MED ORDER — IOHEXOL 300 MG/ML  SOLN
100.0000 mL | Freq: Once | INTRAMUSCULAR | Status: AC | PRN
  Administered 2018-01-09: 100 mL via INTRAVENOUS

## 2018-01-09 MED ORDER — PIPERACILLIN-TAZOBACTAM 3.375 G IVPB 30 MIN
3.3750 g | Freq: Once | INTRAVENOUS | Status: AC
  Administered 2018-01-09: 3.375 g via INTRAVENOUS
  Filled 2018-01-09: qty 50

## 2018-01-09 MED ORDER — PIPERACILLIN-TAZOBACTAM 3.375 G IVPB 30 MIN: 3.3750 g | Freq: Once | INTRAVENOUS | Status: DC

## 2018-01-09 MED ORDER — HYDROMORPHONE HCL 2 MG/ML IJ SOLN
1.0000 mg | Freq: Once | INTRAMUSCULAR | Status: AC
  Administered 2018-01-09: 1 mg via INTRAVENOUS
  Filled 2018-01-09: qty 1

## 2018-01-09 MED ORDER — SUFENTANIL CITRATE 50 MCG/ML IV SOLN
INTRAVENOUS | Status: AC
  Filled 2018-01-09: qty 1

## 2018-01-09 MED ORDER — FENTANYL CITRATE (PF) 100 MCG/2ML IJ SOLN
100.0000 ug | Freq: Once | INTRAMUSCULAR | Status: AC
  Administered 2018-01-09: 100 ug via INTRAVENOUS
  Filled 2018-01-09: qty 2

## 2018-01-09 SURGICAL SUPPLY — 52 items
BIOPATCH BLUE 3/4IN DISK W/1.5 (GAUZE/BANDAGES/DRESSINGS) ×2 IMPLANT
BLADE CLIPPER SURG (BLADE) IMPLANT
CANISTER SUCT 3000ML PPV (MISCELLANEOUS) ×2 IMPLANT
CANISTER WOUNDNEG PRESSURE 500 (CANNISTER) ×2 IMPLANT
CHLORAPREP W/TINT 26ML (MISCELLANEOUS) ×2 IMPLANT
COVER SURGICAL LIGHT HANDLE (MISCELLANEOUS) ×2 IMPLANT
DRAIN CHANNEL 19F RND (DRAIN) ×2 IMPLANT
DRAPE LAPAROSCOPIC ABDOMINAL (DRAPES) ×2 IMPLANT
DRAPE WARM FLUID 44X44 (DRAPE) ×2 IMPLANT
DRSG OPSITE POSTOP 4X10 (GAUZE/BANDAGES/DRESSINGS) IMPLANT
DRSG OPSITE POSTOP 4X8 (GAUZE/BANDAGES/DRESSINGS) IMPLANT
DRSG VAC ATS MED SENSATRAC (GAUZE/BANDAGES/DRESSINGS) ×2 IMPLANT
ELECT BLADE 4.0 EZ CLEAN MEGAD (MISCELLANEOUS) ×2
ELECT BLADE 6.5 EXT (BLADE) IMPLANT
ELECT CAUTERY BLADE 6.4 (BLADE) IMPLANT
ELECT REM PT RETURN 9FT ADLT (ELECTROSURGICAL) ×2
ELECTRODE BLDE 4.0 EZ CLN MEGD (MISCELLANEOUS) ×1 IMPLANT
ELECTRODE REM PT RTRN 9FT ADLT (ELECTROSURGICAL) ×1 IMPLANT
EVACUATOR SILICONE 100CC (DRAIN) ×2 IMPLANT
GLOVE BIO SURGEON STRL SZ7 (GLOVE) ×4 IMPLANT
GLOVE BIOGEL PI IND STRL 7.5 (GLOVE) ×2 IMPLANT
GLOVE BIOGEL PI INDICATOR 7.5 (GLOVE) ×2
GLOVE SURG SS PI 6.5 STRL IVOR (GLOVE) ×2 IMPLANT
GOWN STRL REUS W/ TWL LRG LVL3 (GOWN DISPOSABLE) ×2 IMPLANT
GOWN STRL REUS W/TWL LRG LVL3 (GOWN DISPOSABLE) ×2
KIT BASIN OR (CUSTOM PROCEDURE TRAY) ×2 IMPLANT
KIT TURNOVER KIT B (KITS) ×2 IMPLANT
LIGASURE IMPACT 36 18CM CVD LR (INSTRUMENTS) ×2 IMPLANT
NS IRRIG 1000ML POUR BTL (IV SOLUTION) ×4 IMPLANT
PACK GENERAL/GYN (CUSTOM PROCEDURE TRAY) ×2 IMPLANT
PAD ARMBOARD 7.5X6 YLW CONV (MISCELLANEOUS) ×2 IMPLANT
RELOAD PROXIMATE 75MM BLUE (ENDOMECHANICALS) ×4 IMPLANT
SPECIMEN JAR LARGE (MISCELLANEOUS) IMPLANT
SPONGE LAP 18X18 RF (DISPOSABLE) ×2 IMPLANT
SPONGE LAP 18X18 X RAY DECT (DISPOSABLE) IMPLANT
STAPLER GUN LINEAR PROX 60 (STAPLE) ×2 IMPLANT
STAPLER PROXIMATE 75MM BLUE (STAPLE) ×2 IMPLANT
STAPLER VISISTAT 35W (STAPLE) ×2 IMPLANT
SUCTION POOLE TIP (SUCTIONS) ×2 IMPLANT
SUT ETHILON 2 0 FS 18 (SUTURE) ×2 IMPLANT
SUT PDS AB 1 TP1 96 (SUTURE) ×4 IMPLANT
SUT SILK 2 0 (SUTURE) ×1
SUT SILK 2 0 SH CR/8 (SUTURE) ×4 IMPLANT
SUT SILK 2-0 18XBRD TIE 12 (SUTURE) ×1 IMPLANT
SUT SILK 3 0 (SUTURE) ×1
SUT SILK 3 0 SH CR/8 (SUTURE) ×2 IMPLANT
SUT SILK 3-0 18XBRD TIE 12 (SUTURE) ×1 IMPLANT
SUT VIC AB 3-0 SH 27 (SUTURE)
SUT VIC AB 3-0 SH 27X BRD (SUTURE) IMPLANT
TOWEL OR 17X26 10 PK STRL BLUE (TOWEL DISPOSABLE) ×2 IMPLANT
TRAY FOLEY MTR SLVR 16FR STAT (SET/KITS/TRAYS/PACK) ×2 IMPLANT
YANKAUER SUCT BULB TIP NO VENT (SUCTIONS) IMPLANT

## 2018-01-09 NOTE — ED Triage Notes (Signed)
Pt c/o RUQ pain for the past two weeks with night sweats. Pt tender in RUQ; has been having diarrhea after taking amoxicillin for being diagnosed with flu

## 2018-01-09 NOTE — ED Provider Notes (Signed)
Patient placed in Quick Look pathway, seen and evaluated   Chief Complaint: RLQ pain, decreased appetitie  HPI:   Presents today for evaluation of 2 to 3 days of worsening right lower quadrant pain.  He reports generally decreased appetite.  His pain is made worse with movement, and coughing.  He was recently treated for influenza with antibiotics.  ROS: No dysuria of breath.  Physical Exam:   Gen: No distress  Neuro: Awake and Alert  Skin: Warm    Focused Exam: Abdomen is tender to palpation in the right lower quadrant.  No rebound or guarding.  Patient appears uncomfortable.   Initiation of care has begun. The patient has been counseled on the process, plan, and necessity for staying for the completion/evaluation, and the remainder of the medical screening examination    Alexander Pollard, Alexander Pollard 01/09/18 2014

## 2018-01-09 NOTE — H&P (Signed)
Alexander Pollard is an 67 y.o. male.   Chief Complaint: right sided abd pain HPI: 24 yom who has a several days of worsening abdominal pain that has been progressive. He has had a cough for several weeks.  The pain is not relieved by anything made worse by movement/coughing.  He is not really passing flatus or stool. Not eating.  No fevers but having chills.  Some nausea.  Not urinating much. He has a colonoscopy in recent past with Dr Michail Sermon that he says is negative.  He is otherwise very healthy.   Past Medical History:  Diagnosis Date  . Dilated aortic root ([place])   . Dyslipidemia    a. h/o low HDL.  . Enlarged aorta ([place])   . Family history of early CAD   . 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 Marked hypertensive response to exercise by ETT 06/2016 prompting addition of Toprol. b. F/u amb BP monitor normal.    Past Surgical History:  Procedure Laterality Date  . COLONOSCOPY     next due 08/2017    Family History  Problem Relation Age of Onset  . Heart attack Mother   . Heart disease Mother   . Leukemia Brother   . Lymphoma Brother   . Heart disease Brother    Social History:  reports that he has quit smoking. He has never used smokeless tobacco. He reports that he drinks alcohol. He reports that he does not use drugs.  Allergies: No Known Allergies  meds reviewed  Results for orders placed or performed during the hospital encounter of 01/09/18 (from the past 48 hour(s))  Lipase, blood     Status: None   Collection Time: 01/09/18  8:08 PM  Result Value Ref Range   Lipase 35 11 - 51 U/L    Comment: Performed at [facility], [place] [address] [place], [place] [address]  Comprehensive metabolic panel     Status: Abnormal   Collection Time: 01/09/18  8:08 PM  Result Value Ref Range   Sodium 127 (L) 135 - 145 mmol/L   Potassium 3.9 3.5 - 5.1 mmol/L   Chloride 89 (L) 101  - 111 mmol/L   CO2 19 (L) 22 - 32 mmol/L   Glucose, Bld 276 (H) 65 - 99 mg/dL   BUN 18 6 - 20 mg/dL   Creatinine, Ser 1.46 (H) 0.61 - 1.24 mg/dL   Calcium 9.1 8.9 - 10.3 mg/dL   Total Protein 7.6 6.5 - 8.1 g/dL   Albumin 3.0 (L) 3.5 - 5.0 g/dL   AST 41 15 - 41 U/L   ALT 41 17 - 63 U/L   Alkaline Phosphatase 95 38 - 126 U/L   Total Bilirubin 2.7 (H) 0.3 - 1.2 mg/dL   GFR calc non Af Amer 48 (L) >60 mL/min   GFR calc Af Amer 56 (L) >60 mL/min    Comment: (NOTE) The eGFR has been calculated using the CKD EPI equation. This calculation has not been validated in all clinical situations. eGFR's persistently <60 mL/min signify possible Chronic Kidney Disease.    Anion gap 19 (H) 5 - 15    Comment: Performed at [facility], [place] [address] [place], [place] [address]  CBC     Status: Abnormal   Collection Time: 01/09/18  8:08 PM  Result Value Ref Range   WBC 44.3 (H)  4.0 - 10.5 K/uL   RBC 4.98 4.22 - 5.81 MIL/uL   Hemoglobin 16.2 13.0 - 17.0 g/dL   HCT 45.1 39.0 - 52.0 %   MCV 90.6 78.0 - 100.0 fL   MCH 32.5 26.0 - 34.0 pg   MCHC 35.9 30.0 - 36.0 g/dL   RDW 12.4 11.5 - 15.5 %   Platelets 512 (H) 150 - 400 K/uL    Comment: Performed at [facility] [address], [place], [place] [address]  Urinalysis, Routine w reflex microscopic     Status: Abnormal   Collection Time: 01/09/18  8:28 PM  Result Value Ref Range   Color, Urine AMBER (A) YELLOW    Comment: BIOCHEMICALS MAY BE AFFECTED BY COLOR   APPearance HAZY (A) CLEAR   Specific Gravity, Urine 1.025 1.005 - 1.030   pH 5.0 5.0 - 8.0   Glucose, UA 50 (A) NEGATIVE mg/dL   Hgb urine dipstick NEGATIVE NEGATIVE   Bilirubin Urine NEGATIVE NEGATIVE   Ketones, ur 5 (A) NEGATIVE mg/dL   Protein, ur 100 (A) NEGATIVE mg/dL   Nitrite NEGATIVE NEGATIVE   Leukocytes, UA NEGATIVE NEGATIVE   RBC / HPF 0-5 0 - 5 RBC/hpf   WBC, UA 0-5 0 - 5 WBC/hpf   Bacteria, UA NONE SEEN NONE SEEN   Squamous Epithelial / LPF 0-5 0 - 5     Comment: Please note change in reference range.   Mucus PRESENT    Hyaline Casts, UA PRESENT     Comment: Performed at [facility], [place] [address] [place], [place] [address]  Lactic acid, plasma     Status: Abnormal   Collection Time: 01/09/18  9:24 PM  Result Value Ref Range   Lactic Acid, Venous 4.7 (HH) 0.5 - 1.9 mmol/L    Comment: CRITICAL RESULT CALLED TO, READ BACK BY AND VERIFIED WITH: M.TOBIAS,RN 2239 01/09/18 M.CAMPBELL Performed at [facility], [place] [address] [place], [place] [address]    Ct Abdomen Pelvis W Contrast  Result Date: 01/09/2018 CLINICAL DATA:  Right lower quadrant abdominal pain x3 days EXAM: CT ABDOMEN AND PELVIS WITH CONTRAST TECHNIQUE: Multidetector CT imaging of the abdomen and pelvis was performed using the standard protocol following bolus administration of intravenous contrast. CONTRAST:  198m OMNIPAQUE IOHEXOL 300 MG/ML  SOLN COMPARISON:  Abdominal ultrasound dated 05/28/2014 FINDINGS: Lower chest: Small right pleural effusion. Mild patchy right lower lobe opacity (series 5/image 6), atelectasis versus pneumonia. Hepatobiliary: Two probable 5 mm cysts in the liver (series 3/image 16). Gallbladder is unremarkable. No intrahepatic or extrahepatic ductal dilatation. Pancreas: Within normal limits. Spleen: Splenomegaly, measuring 15.7 cm in craniocaudal dimension. Adrenals/Urinary Tract: Adrenal glands are within normal limits. Kidneys within normal limits.  No hydronephrosis. Bladder is within normal limits. Stomach/Bowel: Stomach is notable for a small hiatal hernia. No evidence of bowel obstruction. Abnormal appendix with surrounding inflammatory changes and gas, suggesting perforated acute appendicitis. A 5 mm appendicolith may be present distally (series 3/image 86). Notably, the infection invades the right retroperitoneal space, with a poorly formed 5.7 x 5.8 x 6.5 cm fluid and gas collection lateral to the right psoas muscle (series 3/image 58),  without a well-defined rim/wall to suggest a true abscess. Additional gas extends superiorly along the right lateral abdominal wall (series 3/image 33). Notably, this degree of gas (as well as the retroperitoneal location) are both unusual in the setting of acute appendicitis, but the inflammatory changes are centered at the appendix/base of the cecum.  Vascular/Lymphatic: No evidence of abdominal aortic aneurysm. Atherosclerotic calcifications of the abdominal aorta and branch vessels. No suspicious abdominopelvic lymphadenopathy. Reproductive: Prostate is unremarkable. Other: Additional stranding along the right kidney. No pelvic ascites. Musculoskeletal: Visualized osseous structures are within normal limits. IMPRESSION: Perforated acute appendicitis with a suspected 5 mm distal appendicolith. Notably, the infection invades the retroperitoneal space with a poorly formed 6.5 cm fluid and gas collection lateral to the right psoas muscle. Additional gas extends superiorly along the right lateral abdominal wall. These results were called by telephone at the time of interpretation on 01/09/2018 at 10:58 PM to Dr. Wilson Singer, who verbally acknowledged these results. Electronically Signed   By: Julian Hy M.D.   On: 01/09/2018 23:02    Review of Systems  Constitutional: Positive for chills. Negative for fever.  Gastrointestinal: Positive for abdominal pain, constipation and nausea. Negative for blood in stool.  All other systems reviewed and are negative.   Blood pressure 131/88, pulse 97, temperature (!) 97.5 F (36.4 C), temperature source Oral, resp. rate (!) 30, SpO2 93 %. Physical Exam  Vitals reviewed. Constitutional: He is oriented to person, place, and time. He appears well-developed and well-nourished.  HENT:  Head: Normocephalic and atraumatic.  Right Ear: External ear normal.  Left Ear: External ear normal.  Eyes: No scleral icterus.  Neck: Neck supple.  Cardiovascular: Normal rate,  regular rhythm, normal heart sounds and intact distal pulses.  Respiratory: Effort normal and breath sounds normal. He has no wheezes.  GI: He exhibits distension. Bowel sounds are decreased. There is tenderness in the right upper quadrant and right lower quadrant. There is rigidity and guarding. A hernia (small reducible uh) is present.  Musculoskeletal: Normal range of motion. He exhibits edema and tenderness.  Lymphadenopathy:    He has no cervical adenopathy.  Neurological: He is alert and oriented to person, place, and time.  Skin: He is diaphoretic.     Assessment/Plan Perforated viscus  He appears very ill with elevated wbc, cr and lactate. His exam has peritonitis on it. His ct scan shows what might be perforated appendicitis but there is much more air than I would usually expect. I think due to clinical picture he needs to go to OR ASAP. I recommend elap with likely right colectomy given all the findings. I am not entirely sure this is perforated appendicitis.  Discussed bowel resection with likely anastomosis, small risk of stoma.  Discussed open wound postop and possible drains. He is high risk for postop infection due to findings already.  Risks include but not limited to bleeding, infection, reoperation, leakage of bowel reconnection, stroke, dvt, pna, mi, arrythmia among others.  I dont think there is another option at this point and will proceed asap.  Discussed specimen would go to pathology.    Rolm Bookbinder, MD 01/09/2018, 11:36 PM

## 2018-01-09 NOTE — ED Notes (Signed)
Patient transported to CT 

## 2018-01-09 NOTE — ED Provider Notes (Signed)
[name]   CSN: [id number] Arrival date & time: 01/09/18  1846     History   Chief Complaint Chief Complaint  Patient presents with  . Abdominal Pain    HPI Alexander Pollard is a 67 y.o. male.  HPI   67 year old male with abdominal pain.  Gradual onset 2 to 3 days ago.  Progressively worsening.  The pain is in the right lower quadrant and extends into the right flank.  Worse with movement.  Subjective fevers.  Anorexia.  He feels like he has had decreased urinary output otherwise no acute urinary complaints.  Denies any past abdominal surgeries.  Past Medical History:  Diagnosis Date  . Dilated aortic root ([place])   . Dyslipidemia    a. h/o low HDL.  . Enlarged aorta ([place])   . Family history of early CAD   . 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 Marked hypertensive response to exercise by ETT 06/2016 prompting addition of Toprol. b. F/u amb BP monitor normal.    Patient Active Problem List   Diagnosis Date Noted  . Enlarged aorta ([place])   . Hypertensive response to exercise   . Dyslipidemia 05/28/2014  . Dilated aortic root ([place]) 05/28/2014  . Family history of early CAD 05/10/2014  . Hemochromatosis 03/12/2014    Past Surgical History:  Procedure Laterality Date  . COLONOSCOPY     next due 08/2017        Home Medications    Prior to Admission medications   Medication Sig Start Date End Date Taking? Authorizing Provider  aspirin EC 81 MG tablet Take 81 mg by mouth daily.    [provider]  meloxicam (MOBIC) 15 MG tablet Take 15 mg daily by mouth. with food 06/27/17   [provider]  metoprolol succinate (TOPROL XL) 25 MG 24 hr tablet Take 1 tablet (25 mg total) by mouth daily. 08/15/17   [name], PA-C    Family History Family History  Problem Relation Age of Onset  . Heart attack  Mother   . Heart disease Mother   . Leukemia Brother   . Lymphoma Brother   . Heart disease Brother     Social History Social History   Tobacco Use  . Smoking status: Former Research scientist (life sciences)  . Smokeless tobacco: Never Used  Substance Use Topics  . Alcohol use: Yes    Alcohol/week: 0.0 oz    Comment: Beer weekly.  . Drug use: No     Allergies   Patient has no known allergies.   Review of Systems Review of Systems  All systems reviewed and negative, other than as noted in HPI.  Physical Exam Updated Vital Signs BP 131/88   Pulse 97   Temp (!) 97.5 F (36.4 C) (Oral)   Resp (!) 30   SpO2 93%   Physical Exam  Constitutional: He appears well-developed and well-nourished. No distress.  HENT:  Head: Normocephalic and atraumatic.  Eyes: Conjunctivae are normal. Right eye exhibits no discharge. Left eye exhibits no discharge.  Neck: Neck supple.  Cardiovascular: Normal rate, regular rhythm and normal heart sounds. Exam reveals no gallop and no friction rub.  No murmur heard. Pulmonary/Chest: Effort normal and breath sounds normal. No respiratory distress.  Abdominal: He exhibits no distension. There is tenderness in the right upper quadrant and right lower quadrant. There  is guarding.  Musculoskeletal: He exhibits no edema or tenderness.  Neurological: He is alert.  Skin: Skin is warm and dry.  Psychiatric: He has a normal mood and affect. His behavior is normal. Thought content normal.  Nursing note and vitals reviewed.    ED Treatments / Results  Labs (all labs ordered are listed, but only abnormal results are displayed) Labs Reviewed  COMPREHENSIVE METABOLIC PANEL - Abnormal; Notable for the following components:      Result Value   Sodium 127 (*)    Chloride 89 (*)    CO2 19 (*)    Glucose, Bld 276 (*)    Creatinine, Ser 1.46 (*)    Albumin 3.0 (*)    Total Bilirubin 2.7 (*)    GFR calc non Af Amer 48 (*)    GFR calc Af Amer 56 (*)    Anion gap 19 (*)    All  other components within normal limits  CBC - Abnormal; Notable for the following components:   WBC 44.3 (*)    Platelets 512 (*)    All other components within normal limits  URINALYSIS, ROUTINE W REFLEX MICROSCOPIC - Abnormal; Notable for the following components:   Color, Urine AMBER (*)    APPearance HAZY (*)    Glucose, UA 50 (*)    Ketones, ur 5 (*)    Protein, ur 100 (*)    All other components within normal limits  LACTIC ACID, PLASMA - Abnormal; Notable for the following components:   Lactic Acid, Venous 4.7 (*)    All other components within normal limits  CULTURE, BLOOD (ROUTINE X 2)  CULTURE, BLOOD (ROUTINE X 2)  URINE CULTURE  LIPASE, BLOOD  DIFFERENTIAL  LACTIC ACID, PLASMA  I-STAT TROPONIN, ED  I-STAT CG4 LACTIC ACID, ED  I-STAT CG4 LACTIC ACID, ED    EKG None  Radiology Ct Abdomen Pelvis W Contrast  Result Date: 01/09/2018 CLINICAL DATA:  Right lower quadrant abdominal pain x3 days EXAM: CT ABDOMEN AND PELVIS WITH CONTRAST TECHNIQUE: Multidetector CT imaging of the abdomen and pelvis was performed using the standard protocol following bolus administration of intravenous contrast. CONTRAST:  139m OMNIPAQUE IOHEXOL 300 MG/ML  SOLN COMPARISON:  Abdominal ultrasound dated 05/28/2014 FINDINGS: Lower chest: Small right pleural effusion. Mild patchy right lower lobe opacity (series 5/image 6), atelectasis versus pneumonia. Hepatobiliary: Two probable 5 mm cysts in the liver (series 3/image 16). Gallbladder is unremarkable. No intrahepatic or extrahepatic ductal dilatation. Pancreas: Within normal limits. Spleen: Splenomegaly, measuring 15.7 cm in craniocaudal dimension. Adrenals/Urinary Tract: Adrenal glands are within normal limits. Kidneys within normal limits.  No hydronephrosis. Bladder is within normal limits. Stomach/Bowel: Stomach is notable for a small hiatal hernia. No evidence of bowel obstruction. Abnormal appendix with surrounding inflammatory changes and gas,  suggesting perforated acute appendicitis. A 5 mm appendicolith may be present distally (series 3/image 86). Notably, the infection invades the right retroperitoneal space, with a poorly formed 5.7 x 5.8 x 6.5 cm fluid and gas collection lateral to the right psoas muscle (series 3/image 58), without a well-defined rim/wall to suggest a true abscess. Additional gas extends superiorly along the right lateral abdominal wall (series 3/image 33). Notably, this degree of gas (as well as the retroperitoneal location) are both unusual in the setting of acute appendicitis, but the inflammatory changes are centered at the appendix/base of the cecum. Vascular/Lymphatic: No evidence of abdominal aortic aneurysm. Atherosclerotic calcifications of the abdominal aorta and branch vessels. No suspicious abdominopelvic lymphadenopathy. Reproductive:  Prostate is unremarkable. Other: Additional stranding along the right kidney. No pelvic ascites. Musculoskeletal: Visualized osseous structures are within normal limits. IMPRESSION: Perforated acute appendicitis with a suspected 5 mm distal appendicolith. Notably, the infection invades the retroperitoneal space with a poorly formed 6.5 cm fluid and gas collection lateral to the right psoas muscle. Additional gas extends superiorly along the right lateral abdominal wall. These results were called by telephone at the time of interpretation on 01/09/2018 at 10:58 PM to Dr. Wilson Singer, who verbally acknowledged these results. Electronically Signed   By: Julian Hy M.D.   On: 01/09/2018 23:02    Procedures Procedures (including critical care time)  CRITICAL CARE Performed by: Virgel Manifold Total critical care time: 40 minutes Critical care time was exclusive of separately billable procedures and treating other patients. Critical care was necessary to treat or prevent imminent or life-threatening deterioration. Critical care was time spent personally by me on the following  activities: development of treatment plan with patient and/or surrogate as well as nursing, discussions with consultants, evaluation of patient's response to treatment, examination of patient, obtaining history from patient or surrogate, ordering and performing treatments and interventions, ordering and review of laboratory studies, ordering and review of radiographic studies, pulse oximetry and re-evaluation of patient's condition.   Medications Ordered in ED Medications  piperacillin-tazobactam (ZOSYN) IVPB 3.375 g (has no administration in time range)  fentaNYL (SUBLIMAZE) injection 100 mcg (100 mcg Intravenous Given 01/09/18 2035)  ondansetron (ZOFRAN) injection 4 mg (4 mg Intravenous Given 01/09/18 2035)  piperacillin-tazobactam (ZOSYN) IVPB 3.375 g (0 g Intravenous Stopped 01/09/18 2219)  lactated ringers bolus 1,000 mL (0 mLs Intravenous Stopped 01/09/18 2249)  HYDROmorphone (DILAUDID) injection 1 mg (1 mg Intravenous Given 01/09/18 2149)  iohexol (OMNIPAQUE) 300 MG/ML solution 100 mL (100 mLs Intravenous Contrast Given 01/09/18 2222)  sodium chloride 0.9 % bolus 1,000 mL (1,000 mLs Intravenous New Bag/Given 01/09/18 2251)     Initial Impression / Assessment and Plan / ED Course  I have reviewed the triage vital signs and the nursing notes.  Pertinent labs & imaging results that were available during my care of the patient were reviewed by me and considered in my medical decision making (see chart for details).  Clinical Course as of Jan 10 2335  Mon Jan 09, 2018  2053 WBC(!): 44.3 [EH]  2055 Sepsis criteria met   Pulse Rate: 92 [EH]  2056 Informed RN that patient meets sepsis criteria with HR over 90, and WBC of 44.3.    [EH]  2058 Patient reports that he has not been taking any steroids.  He reports pain is improved after fentanyl.    [EH]  2104 Patient listed as going to pod [facility].  Informed MD Dr. Wilson Singer of pod C that patient will be coming back, high white count, is septic.    [EH]     Clinical Course User Index [EH] Lorin Glass, PA-C      Final Clinical Impressions(s) / ED Diagnoses   Final diagnoses:  Perforated appendicitis    67 year old male with abdominal pain.  CT showing possibly perforated appendicitis.  There is quite a bit of free air and inflammatory changes extending retroperitoneally.  Antibiotics.  Dr Donne Hazel with general surgery was consulted.   ED Discharge Orders    None       Virgel Manifold, MD 01/09/18 2342

## 2018-01-09 NOTE — Progress Notes (Signed)
Pharmacy Antibiotic Note  Alexander Pollard is a 67 y.o. male admitted on 01/09/2018 with intra-abdominal infection.  Pharmacy has been consulted for Zosyn dosing. Code sepsis called.  Plan: Zosyn 3.375g IV q8h (4 hour infusion). F/U cultures, LOT, and clinical progress.    Temp (24hrs), Avg:97.5 F (36.4 C), Min:97.5 F (36.4 C), Max:97.5 F (36.4 C)  Recent Labs  Lab 01/09/18 2008  WBC 44.3*  CREATININE 1.46*    CrCl cannot be calculated (Unknown ideal weight.).    No Known Allergies  Thank you for allowing pharmacy to be a part of this patient's care.   Pharmacy will sign off. Please re-consult if needed.  Blaine Hamper Earle Burson 01/09/2018 9:08 PM

## 2018-01-10 ENCOUNTER — Emergency Department (HOSPITAL_COMMUNITY): Payer: PPO | Admitting: Certified Registered"

## 2018-01-10 ENCOUNTER — Encounter (HOSPITAL_COMMUNITY): Payer: Self-pay | Admitting: General Surgery

## 2018-01-10 ENCOUNTER — Inpatient Hospital Stay (HOSPITAL_COMMUNITY): Payer: PPO

## 2018-01-10 DIAGNOSIS — K631 Perforation of intestine (nontraumatic): Secondary | ICD-10-CM | POA: Diagnosis present

## 2018-01-10 DIAGNOSIS — R339 Retention of urine, unspecified: Secondary | ICD-10-CM | POA: Diagnosis not present

## 2018-01-10 DIAGNOSIS — J90 Pleural effusion, not elsewhere classified: Secondary | ICD-10-CM | POA: Diagnosis not present

## 2018-01-10 DIAGNOSIS — K6819 Other retroperitoneal abscess: Secondary | ICD-10-CM | POA: Diagnosis present

## 2018-01-10 DIAGNOSIS — K3532 Acute appendicitis with perforation and localized peritonitis, without abscess: Secondary | ICD-10-CM | POA: Diagnosis present

## 2018-01-10 DIAGNOSIS — R1031 Right lower quadrant pain: Secondary | ICD-10-CM | POA: Diagnosis present

## 2018-01-10 DIAGNOSIS — Z7982 Long term (current) use of aspirin: Secondary | ICD-10-CM | POA: Diagnosis not present

## 2018-01-10 DIAGNOSIS — Z87891 Personal history of nicotine dependence: Secondary | ICD-10-CM | POA: Diagnosis not present

## 2018-01-10 DIAGNOSIS — R601 Generalized edema: Secondary | ICD-10-CM | POA: Diagnosis not present

## 2018-01-10 DIAGNOSIS — I739 Peripheral vascular disease, unspecified: Secondary | ICD-10-CM | POA: Diagnosis present

## 2018-01-10 DIAGNOSIS — J9690 Respiratory failure, unspecified, unspecified whether with hypoxia or hypercapnia: Secondary | ICD-10-CM | POA: Diagnosis not present

## 2018-01-10 DIAGNOSIS — R600 Localized edema: Secondary | ICD-10-CM | POA: Diagnosis not present

## 2018-01-10 DIAGNOSIS — E785 Hyperlipidemia, unspecified: Secondary | ICD-10-CM | POA: Diagnosis present

## 2018-01-10 DIAGNOSIS — Z791 Long term (current) use of non-steroidal anti-inflammatories (NSAID): Secondary | ICD-10-CM | POA: Diagnosis not present

## 2018-01-10 DIAGNOSIS — I10 Essential (primary) hypertension: Secondary | ICD-10-CM | POA: Diagnosis present

## 2018-01-10 DIAGNOSIS — Z8249 Family history of ischemic heart disease and other diseases of the circulatory system: Secondary | ICD-10-CM | POA: Diagnosis not present

## 2018-01-10 DIAGNOSIS — T8132XA Disruption of internal operation (surgical) wound, not elsewhere classified, initial encounter: Secondary | ICD-10-CM | POA: Diagnosis not present

## 2018-01-10 DIAGNOSIS — I77810 Thoracic aortic ectasia: Secondary | ICD-10-CM | POA: Diagnosis present

## 2018-01-10 DIAGNOSIS — K567 Ileus, unspecified: Secondary | ICD-10-CM | POA: Diagnosis not present

## 2018-01-10 LAB — BASIC METABOLIC PANEL
Anion gap: 8 (ref 5–15)
BUN: 16 mg/dL (ref 6–20)
CO2: 23 mmol/L (ref 22–32)
CREATININE: 0.9 mg/dL (ref 0.61–1.24)
Calcium: 8 mg/dL — ABNORMAL LOW (ref 8.9–10.3)
Chloride: 101 mmol/L (ref 101–111)
GFR calc Af Amer: >60 mL/min (ref >60)
GFR calc non Af Amer: >60 mL/min (ref >60)
Glucose, Bld: 217 mg/dL — ABNORMAL HIGH (ref 65–99)
Potassium: 3.8 mmol/L (ref 3.5–5.1)
SODIUM: 132 mmol/L — AB (ref 135–145)

## 2018-01-10 LAB — GLUCOSE, CAPILLARY
GLUCOSE-CAPILLARY: 216 mg/dL — AB (ref 65–99)
GLUCOSE-CAPILLARY: 167 mg/dL — AB (ref 65–99)
GLUCOSE-CAPILLARY: 137 mg/dL — AB (ref 65–99)
Glucose-Capillary: 227 mg/dL — ABNORMAL HIGH (ref 65–99)
Glucose-Capillary: 166 mg/dL — ABNORMAL HIGH (ref 65–99)

## 2018-01-10 LAB — DIFFERENTIAL
BASOS ABS: 0 10*3/uL (ref 0.0–0.1)
Basophils Relative: 0 %
EOS ABS: 0 10*3/uL (ref 0.0–0.7)
Eosinophils Relative: 0 %
LYMPHS PCT: 4 %
Lymphs Abs: 0.8 10*3/uL (ref 0.7–4.0)
Monocytes Absolute: 0.9 10*3/uL (ref 0.1–1.0)
Monocytes Relative: 5 %
NEUTROS ABS: 18.4 10*3/uL — AB (ref 1.7–7.7)
Neutrophils Relative %: 91 %
WBC MORPHOLOGY: INCREASED

## 2018-01-10 LAB — CBC
HCT: 39 % (ref 39.0–52.0)
Hemoglobin: 13.7 g/dL (ref 13.0–17.0)
MCH: 31.4 pg (ref 26.0–34.0)
MCHC: 35.1 g/dL (ref 30.0–36.0)
MCV: 89.2 fL (ref 78.0–100.0)
Platelets: 271 10*3/uL (ref 150–400)
RBC: 4.37 MIL/uL (ref 4.22–5.81)
RDW: 12.5 % (ref 11.5–15.5)
WBC: 19.1 10*3/uL — AB (ref 4.0–10.5)

## 2018-01-10 LAB — LACTIC ACID, PLASMA: Lactic Acid, Venous: 2.9 mmol/L (ref 0.5–1.9)

## 2018-01-10 LAB — MRSA PCR SCREENING: MRSA by PCR: NEGATIVE

## 2018-01-10 MED ORDER — MIDAZOLAM HCL 5 MG/5ML IJ SOLN
INTRAMUSCULAR | Status: DC | PRN
  Administered 2018-01-10: 2 mg via INTRAVENOUS

## 2018-01-10 MED ORDER — HYDROMORPHONE HCL 2 MG/ML IJ SOLN
0.2500 mg | INTRAMUSCULAR | Status: DC | PRN
  Administered 2018-01-10: 0.5 mg via INTRAVENOUS

## 2018-01-10 MED ORDER — MORPHINE SULFATE (PF) 2 MG/ML IV SOLN
2.0000 mg | INTRAVENOUS | Status: DC | PRN
  Administered 2018-01-10 (×5): 2 mg via INTRAVENOUS
  Filled 2018-01-10 (×5): qty 1

## 2018-01-10 MED ORDER — ROCURONIUM BROMIDE 10 MG/ML (PF) SYRINGE
PREFILLED_SYRINGE | INTRAVENOUS | Status: AC
  Filled 2018-01-10: qty 5

## 2018-01-10 MED ORDER — ONDANSETRON HCL 4 MG/2ML IJ SOLN
INTRAMUSCULAR | Status: DC | PRN
  Administered 2018-01-10: 4 mg via INTRAVENOUS

## 2018-01-10 MED ORDER — ALBUMIN HUMAN 5 % IV SOLN
INTRAVENOUS | Status: DC | PRN
  Administered 2018-01-10: 01:00:00 via INTRAVENOUS

## 2018-01-10 MED ORDER — HYDROMORPHONE HCL 2 MG/ML IJ SOLN
INTRAMUSCULAR | Status: AC
  Filled 2018-01-10: qty 1

## 2018-01-10 MED ORDER — SODIUM CHLORIDE 0.9% FLUSH
10.0000 mL | INTRAVENOUS | Status: DC | PRN
  Administered 2018-01-12: 40 mL
  Filled 2018-01-10: qty 40

## 2018-01-10 MED ORDER — ORAL CARE MOUTH RINSE: 15.0000 mL | Freq: Two times a day (BID) | OROMUCOSAL | Status: DC

## 2018-01-10 MED ORDER — INSULIN ASPART 100 UNIT/ML ~~LOC~~ SOLN
0.0000 [IU] | Freq: Three times a day (TID) | SUBCUTANEOUS | Status: DC
  Administered 2018-01-10: 5 [IU] via SUBCUTANEOUS
  Administered 2018-01-10 (×2): 3 [IU] via SUBCUTANEOUS
  Administered 2018-01-11: 5 [IU] via SUBCUTANEOUS
  Administered 2018-01-11: 2 [IU] via SUBCUTANEOUS
  Administered 2018-01-11: 3 [IU] via SUBCUTANEOUS
  Administered 2018-01-12 – 2018-01-16 (×8): 2 [IU] via SUBCUTANEOUS
  Administered 2018-01-17 – 2018-01-18 (×3): 3 [IU] via SUBCUTANEOUS
  Administered 2018-01-19 – 2018-01-22 (×9): 2 [IU] via SUBCUTANEOUS

## 2018-01-10 MED ORDER — MORPHINE SULFATE (PF) 4 MG/ML IV SOLN
2.0000 mg | INTRAVENOUS | Status: DC | PRN
  Administered 2018-01-10: 2 mg via INTRAVENOUS
  Filled 2018-01-10 (×2): qty 1

## 2018-01-10 MED ORDER — DEXAMETHASONE SODIUM PHOSPHATE 10 MG/ML IJ SOLN
INTRAMUSCULAR | Status: AC
  Filled 2018-01-10: qty 1

## 2018-01-10 MED ORDER — SUGAMMADEX SODIUM 200 MG/2ML IV SOLN
INTRAVENOUS | Status: AC
  Filled 2018-01-10: qty 2

## 2018-01-10 MED ORDER — SUFENTANIL CITRATE 50 MCG/ML IV SOLN
INTRAVENOUS | Status: DC | PRN
  Administered 2018-01-10: 20 ug via INTRAVENOUS
  Administered 2018-01-10: 10 ug via INTRAVENOUS

## 2018-01-10 MED ORDER — SODIUM CHLORIDE 0.9 % IV SOLN
INTRAVENOUS | Status: DC | PRN
  Administered 2018-01-10: 01:00:00 via INTRAVENOUS

## 2018-01-10 MED ORDER — SUGAMMADEX SODIUM 200 MG/2ML IV SOLN
INTRAVENOUS | Status: DC | PRN
  Administered 2018-01-10: 200 mg via INTRAVENOUS

## 2018-01-10 MED ORDER — SODIUM CHLORIDE 0.9 % IV SOLN
INTRAVENOUS | Status: DC
  Administered 2018-01-10: 100 mL/h via INTRAVENOUS
  Administered 2018-01-10 – 2018-01-14 (×7): via INTRAVENOUS

## 2018-01-10 MED ORDER — ORAL CARE MOUTH RINSE
15.0000 mL | Freq: Two times a day (BID) | OROMUCOSAL | Status: DC
  Administered 2018-01-10 – 2018-01-15 (×9): 15 mL via OROMUCOSAL

## 2018-01-10 MED ORDER — METOPROLOL TARTRATE 5 MG/5ML IV SOLN
5.0000 mg | Freq: Three times a day (TID) | INTRAVENOUS | Status: AC
  Administered 2018-01-11 – 2018-01-13 (×7): 5 mg via INTRAVENOUS
  Filled 2018-01-10 (×10): qty 5

## 2018-01-10 MED ORDER — ONDANSETRON 4 MG PO TBDP
4.0000 mg | ORAL_TABLET | Freq: Four times a day (QID) | ORAL | Status: DC | PRN
  Filled 2018-01-10: qty 1

## 2018-01-10 MED ORDER — ENOXAPARIN SODIUM 40 MG/0.4ML ~~LOC~~ SOLN
40.0000 mg | SUBCUTANEOUS | Status: DC
  Administered 2018-01-11 – 2018-01-22 (×13): 40 mg via SUBCUTANEOUS
  Filled 2018-01-10 (×13): qty 0.4

## 2018-01-10 MED ORDER — SUCCINYLCHOLINE CHLORIDE 200 MG/10ML IV SOSY
PREFILLED_SYRINGE | INTRAVENOUS | Status: AC
  Filled 2018-01-10: qty 10

## 2018-01-10 MED ORDER — CHLORHEXIDINE GLUCONATE CLOTH 2 % EX PADS
6.0000 | MEDICATED_PAD | Freq: Every day | CUTANEOUS | Status: DC
  Administered 2018-01-10 – 2018-01-15 (×6): 6 via TOPICAL

## 2018-01-10 MED ORDER — PROMETHAZINE HCL 25 MG/ML IJ SOLN: 6.2500 mg | INTRAMUSCULAR | Status: DC | PRN

## 2018-01-10 MED ORDER — ONDANSETRON HCL 4 MG/2ML IJ SOLN
INTRAMUSCULAR | Status: AC
  Filled 2018-01-10: qty 2

## 2018-01-10 MED ORDER — SODIUM CHLORIDE 0.9% FLUSH
10.0000 mL | Freq: Two times a day (BID) | INTRAVENOUS | Status: DC
  Administered 2018-01-10 – 2018-01-22 (×8): 10 mL

## 2018-01-10 MED ORDER — PIPERACILLIN-TAZOBACTAM 3.375 G IVPB
3.3750 g | Freq: Three times a day (TID) | INTRAVENOUS | Status: DC
  Administered 2018-01-10 – 2018-01-21 (×33): 3.375 g via INTRAVENOUS
  Filled 2018-01-10 (×38): qty 50

## 2018-01-10 MED ORDER — DEXAMETHASONE SODIUM PHOSPHATE 10 MG/ML IJ SOLN
INTRAMUSCULAR | Status: DC | PRN
  Administered 2018-01-10: 10 mg via INTRAVENOUS

## 2018-01-10 MED ORDER — SODIUM CHLORIDE 0.9 % IJ SOLN
INTRAMUSCULAR | Status: AC
  Filled 2018-01-10: qty 10

## 2018-01-10 MED ORDER — LACTATED RINGERS IV SOLN
INTRAVENOUS | Status: DC | PRN
  Administered 2018-01-10: via INTRAVENOUS

## 2018-01-10 MED ORDER — LIDOCAINE 2% (20 MG/ML) 5 ML SYRINGE
INTRAMUSCULAR | Status: AC
  Filled 2018-01-10: qty 5

## 2018-01-10 MED ORDER — METHOCARBAMOL 1000 MG/10ML IJ SOLN
500.0000 mg | Freq: Three times a day (TID) | INTRAVENOUS | Status: DC | PRN
  Filled 2018-01-10: qty 5

## 2018-01-10 MED ORDER — ROCURONIUM BROMIDE 100 MG/10ML IV SOLN
INTRAVENOUS | Status: DC | PRN
  Administered 2018-01-10 (×2): 50 mg via INTRAVENOUS

## 2018-01-10 MED ORDER — ONDANSETRON HCL 4 MG/2ML IJ SOLN
4.0000 mg | Freq: Four times a day (QID) | INTRAMUSCULAR | Status: DC | PRN
  Administered 2018-01-10: 4 mg via INTRAVENOUS
  Filled 2018-01-10 (×2): qty 2

## 2018-01-10 MED ORDER — ACETAMINOPHEN 10 MG/ML IV SOLN
1000.0000 mg | Freq: Four times a day (QID) | INTRAVENOUS | Status: AC
  Administered 2018-01-10 – 2018-01-11 (×4): 1000 mg via INTRAVENOUS
  Filled 2018-01-10 (×4): qty 100

## 2018-01-10 MED ORDER — LIDOCAINE HCL (CARDIAC) PF 100 MG/5ML IV SOSY
PREFILLED_SYRINGE | INTRAVENOUS | Status: DC | PRN
  Administered 2018-01-10: 100 mg via INTRATRACHEAL

## 2018-01-10 MED ORDER — PROPOFOL 10 MG/ML IV BOLUS
INTRAVENOUS | Status: DC | PRN
  Administered 2018-01-10: 120 mg via INTRAVENOUS

## 2018-01-10 MED ORDER — SUCCINYLCHOLINE CHLORIDE 20 MG/ML IJ SOLN
INTRAMUSCULAR | Status: DC | PRN
  Administered 2018-01-10: 125 mg via INTRAVENOUS

## 2018-01-10 NOTE — Progress Notes (Signed)
Pharmacy Antibiotic Note  Alexander Pollard is a 67 y.o. male admitted on 01/09/2018 with intra-abdominal infection, now s/p colectomy, drainage of retroperitoneal abscess, and placement of abdominal wound vac.  Pharmacy has been consulted for Zosyn dosing.  Plan: Zosyn 3.375g IV q8h (4-hour infusion).  Height: 5\' 8"  (172.7 cm) Weight: 170 lb (77.1 kg) IBW/kg (Calculated) : 68.4  Temp (24hrs), Avg:97.5 F (36.4 C), Min:97.5 F (36.4 C), Max:97.5 F (36.4 C)  Recent Labs  Lab 01/09/18 2008 01/09/18 2124  WBC 44.3*  --   CREATININE 1.46*  --   LATICACIDVEN  --  4.7*    Estimated Creatinine Clearance: 48.2 mL/min (A) (by C-G formula based on SCr of 1.46 mg/dL (H)).    No Known Allergies   Thank you for allowing pharmacy to be a part of this patient's care.  Wynona Neat, PharmD, BCPS  01/10/2018 2:24 AM

## 2018-01-10 NOTE — Anesthesia Procedure Notes (Signed)
Central Venous Catheter Insertion Performed by: Duane Boston, MD, anesthesiologist Start/End4/30/2019 12:28 AM, 01/10/2018 12:44 AM Patient location: Pre-op. Preanesthetic checklist: patient identified, IV checked, site marked, risks and benefits discussed, surgical consent, monitors and equipment checked, pre-op evaluation, timeout performed and anesthesia consent Position: Trendelenburg Lidocaine 1% used for infiltration and patient sedated Hand hygiene performed , maximum sterile barriers used  and Seldinger technique used Catheter size: 8 Fr Total catheter length 16. Central line was placed.Triple lumen Procedure performed using ultrasound guided technique. Ultrasound Notes:anatomy identified, needle tip was noted to be adjacent to the nerve/plexus identified, no ultrasound evidence of intravascular and/or intraneural injection and image(s) printed for medical record Attempts: 1 Following insertion, dressing applied, line sutured and Biopatch. Post procedure assessment: blood return through all ports, free fluid flow and no air  Patient tolerated the procedure well with no immediate complications.

## 2018-01-10 NOTE — Anesthesia Preprocedure Evaluation (Addendum)
Anesthesia Evaluation  Patient identified by MRN, date of birth, ID band Patient awake    Reviewed: Allergy & Precautions, NPO status , Patient's Chart, lab work & pertinent test results  History of Anesthesia Complications Negative for: history of anesthetic complications  Airway Mallampati: II  TM Distance: >3 FB Neck ROM: Full    Dental no notable dental hx. (+) Dental Advisory Given   Pulmonary former smoker,    Pulmonary exam normal        Cardiovascular + Peripheral Vascular Disease  Normal cardiovascular exam     Neuro/Psych negative neurological ROS  negative psych ROS   GI/Hepatic Neg liver ROS,   Endo/Other  negative endocrine ROS  Renal/GU Renal InsufficiencyRenal disease     Musculoskeletal   Abdominal   Peds  Hematology   Anesthesia Other Findings   Reproductive/Obstetrics                            Anesthesia Physical Anesthesia Plan  ASA: III and emergent  Anesthesia Plan: General   Post-op Pain Management:    Induction: Intravenous, Rapid sequence and Cricoid pressure planned  PONV Risk Score and Plan: 3 and Dexamethasone and Diphenhydramine  Airway Management Planned: Oral ETT  Additional Equipment: CVP  Intra-op Plan:   Post-operative Plan: Possible Post-op intubation/ventilation  Informed Consent: I have reviewed the patients History and Physical, chart, labs and discussed the procedure including the risks, benefits and alternatives for the proposed anesthesia with the patient or authorized representative who has indicated his/her understanding and acceptance.   Dental advisory given  Plan Discussed with: CRNA, Anesthesiologist and Surgeon  Anesthesia Plan Comments:         Anesthesia Quick Evaluation

## 2018-01-10 NOTE — Anesthesia Procedure Notes (Signed)
Procedure Name: Intubation Date/Time: 01/10/2018 12:22 AM Performed by: Claris Che, CRNA Pre-anesthesia Checklist: Patient identified, Emergency Drugs available, Suction available, Patient being monitored and Timeout performed Patient Re-evaluated:Patient Re-evaluated prior to induction Oxygen Delivery Method: Circle system utilized Preoxygenation: Pre-oxygenation with 100% oxygen Induction Type: IV induction, Rapid sequence and Cricoid Pressure applied Laryngoscope Size: Mac and 3 Grade View: Grade II Tube type: Subglottic suction tube Tube size: 8.0 mm Number of attempts: 1 Airway Equipment and Method: Stylet Placement Confirmation: ETT inserted through vocal cords under direct vision,  positive ETCO2 and breath sounds checked- equal and bilateral Secured at: 24 cm Tube secured with: Tape Dental Injury: Teeth and Oropharynx as per pre-operative assessment

## 2018-01-10 NOTE — Anesthesia Postprocedure Evaluation (Signed)
Anesthesia Post Note  Patient: Alexander Pollard  Procedure(s) Performed: EXPLORATORY LAPAROTOMY, RIGHT COLECTOMY, PLACEMENT OF WOUND VAC TO ABDOMEN, DRAINAGE OF RETROPERITONEAL ABSCESS (N/A Abdomen)     Patient location during evaluation: PACU Anesthesia Type: General Level of consciousness: sedated Pain management: pain level controlled Vital Signs Assessment: post-procedure vital signs reviewed and stable Respiratory status: spontaneous breathing and respiratory function stable Cardiovascular status: stable Postop Assessment: no apparent nausea or vomiting Anesthetic complications: no                 Regie Bunner DANIEL

## 2018-01-10 NOTE — Transfer of Care (Signed)
Immediate Anesthesia Transfer of Care Note  Patient: Alexander Pollard  Procedure(s) Performed: EXPLORATORY LAPAROTOMY, RIGHT COLECTOMY, PLACEMENT OF WOUND VAC TO ABDOMEN, DRAINAGE OF RETROPERITONEAL ABSCESS (N/A Abdomen)  Patient Location: PACU  Anesthesia Type:General  Level of Consciousness: sedated, drowsy, patient cooperative and responds to stimulation  Airway & Oxygen Therapy: Patient Spontanous Breathing and Patient connected to face mask oxygen  Post-op Assessment: Report given to RN, Post -op Vital signs reviewed and stable and Patient moving all extremities X 4  Post vital signs: Reviewed and stable  Last Vitals:  Vitals Value Taken Time  BP 108/80 01/10/2018  2:26 AM  Temp    Pulse 82 01/10/2018  2:28 AM  Resp 20 01/10/2018  2:28 AM  SpO2 99 % 01/10/2018  2:28 AM  Vitals shown include unvalidated device data.  Last Pain:  Vitals:   01/09/18 2345  TempSrc:   PainSc: 8          Complications: No apparent anesthesia complications

## 2018-01-10 NOTE — Plan of Care (Signed)
  Problem: Education: Goal: Knowledge of General Education information will improve Outcome: Progressing   Problem: Health Behavior/Discharge Planning: Goal: Ability to manage health-related needs will improve Outcome: Progressing   Problem: Safety: Goal: Ability to remain free from injury will improve Outcome: Progressing   Problem: Fluid Volume: Goal: Hemodynamic stability will improve Outcome: Progressing   Problem: Clinical Measurements: Goal: Diagnostic test results will improve Outcome: Progressing Goal: Signs and symptoms of infection will decrease Outcome: Progressing   Problem: Respiratory: Goal: Ability to maintain adequate ventilation will improve Outcome: Progressing

## 2018-01-10 NOTE — Progress Notes (Signed)
Patient arrived to unit. Alert and oriented x4. Dressings clean dry and intact. Family at the bed side. Will continue to monitor patient.

## 2018-01-10 NOTE — Progress Notes (Signed)
[facility] Progress Note  1 Day Post-Op  Subjective: CC: abdominal pain Patient states abdominal pain is pretty well controlled at rest but is sharp with movement. Denies nausea, minimal output from NGT.  Objective: Vital signs in last 24 hours: Temp:  [96.7 F (35.9 C)-97.5 F (36.4 C)] 96.7 F (35.9 C) (04/30 0721) Pulse Rate:  [62-97] 89 (04/30 0715) Resp:  [15-30] 16 (04/30 0715) BP: (96-139)/(75-95) 109/75 (04/30 0715) SpO2:  [93 %-99 %] 99 % (04/30 0715) Weight:  [77.1 kg (170 lb)] 77.1 kg (170 lb) (04/29 2355) Last BM Date: (PTA)  Intake/Output from previous day: 04/29 0701 - 04/30 0700 In: 5175 [I.V.:2525; IV Piggyback:2650] Out: 972 [Urine:610; Drains:312; Blood:50] Intake/Output this shift: Total I/O In: 140 [I.V.:110; NG/GT:30] Out: 275 [Urine:120; Emesis/NG output:30; Drains:125]  PE: Gen:  Alert, NAD, pleasant Card:  Regular rate and rhythm, pedal pulses 2+ BL Pulm:  Normal effort, clear to auscultation bilaterally Abd: Soft, appropriately tender, non-distended, bowel sounds hypoactive, no HSM, midline incision with VAC present and serosanguinous drainage in tubing; RLQ JP with sanguinous drainage Skin: warm and dry, no rashes  Psych: A&Ox3   Lab Results:  Recent Labs    01/09/18 2008 01/10/18 0600  WBC 44.3* 19.1*  HGB 16.2 13.7  HCT 45.1 39.0  PLT 512* 271   BMET Recent Labs    01/09/18 2008  NA 127*  K 3.9  CL 89*  CO2 19*  GLUCOSE 276*  BUN 18  CREATININE 1.46*  CALCIUM 9.1   PT/INR No results for input(s): LABPROT, INR in the last 72 hours. CMP     Component Value Date/Time   NA 127 (L) 01/09/2018 2008   NA 141 09/10/2015 1117   K 3.9 01/09/2018 2008   CL 89 (L) 01/09/2018 2008   CO2 19 (L) 01/09/2018 2008   GLUCOSE 276 (H) 01/09/2018 2008   BUN 18 01/09/2018 2008   BUN 14 09/10/2015 1117   CREATININE 1.46 (H) 01/09/2018 2008   CREATININE 0.90 03/19/2015 0925   CALCIUM 9.1 01/09/2018 2008   PROT 7.6  01/09/2018 2008   PROT 6.5 09/10/2015 1117   ALBUMIN 3.0 (L) 01/09/2018 2008   ALBUMIN 4.5 09/10/2015 1117   AST 41 01/09/2018 2008   ALT 41 01/09/2018 2008   ALKPHOS 95 01/09/2018 2008   BILITOT 2.7 (H) 01/09/2018 2008   BILITOT 1.3 (H) 09/10/2015 1117   GFRNONAA 48 (L) 01/09/2018 2008   GFRAA 56 (L) 01/09/2018 2008   Lipase     Component Value Date/Time   LIPASE 35 01/09/2018 2008       Studies/Results: Ct Abdomen Pelvis W Contrast  Result Date: 01/09/2018 CLINICAL DATA:  Right lower quadrant abdominal pain x3 days EXAM: CT ABDOMEN AND PELVIS WITH CONTRAST TECHNIQUE: Multidetector CT imaging of the abdomen and pelvis was performed using the standard protocol following bolus administration of intravenous contrast. CONTRAST:  167mL OMNIPAQUE IOHEXOL 300 MG/ML  SOLN COMPARISON:  Abdominal ultrasound dated 05/28/2014 FINDINGS: Lower chest: Small right pleural effusion. Mild patchy right lower lobe opacity (series 5/image 6), atelectasis versus pneumonia. Hepatobiliary: Two probable 5 mm cysts in the liver (series 3/image 16). Gallbladder is unremarkable. No intrahepatic or extrahepatic ductal dilatation. Pancreas: Within normal limits. Spleen: Splenomegaly, measuring 15.7 cm in craniocaudal dimension. Adrenals/Urinary Tract: Adrenal glands are within normal limits. Kidneys within normal limits.  No hydronephrosis. Bladder is within normal limits. Stomach/Bowel: Stomach is notable for a small hiatal hernia. No evidence of bowel obstruction. Abnormal appendix with surrounding inflammatory  changes and gas, suggesting perforated acute appendicitis. A 5 mm appendicolith may be present distally (series 3/image 86). Notably, the infection invades the right retroperitoneal space, with a poorly formed 5.7 x 5.8 x 6.5 cm fluid and gas collection lateral to the right psoas muscle (series 3/image 58), without a well-defined rim/wall to suggest a true abscess. Additional gas extends superiorly along the  right lateral abdominal wall (series 3/image 33). Notably, this degree of gas (as well as the retroperitoneal location) are both unusual in the setting of acute appendicitis, but the inflammatory changes are centered at the appendix/base of the cecum. Vascular/Lymphatic: No evidence of abdominal aortic aneurysm. Atherosclerotic calcifications of the abdominal aorta and branch vessels. No suspicious abdominopelvic lymphadenopathy. Reproductive: Prostate is unremarkable. Other: Additional stranding along the right kidney. No pelvic ascites. Musculoskeletal: Visualized osseous structures are within normal limits. IMPRESSION: Perforated acute appendicitis with a suspected 5 mm distal appendicolith. Notably, the infection invades the retroperitoneal space with a poorly formed 6.5 cm fluid and gas collection lateral to the right psoas muscle. Additional gas extends superiorly along the right lateral abdominal wall. These results were called by telephone at the time of interpretation on 01/09/2018 at 10:58 PM to Dr. Wilson Singer, who verbally acknowledged these results. Electronically Signed   By: Julian Hy M.D.   On: 01/09/2018 23:02   Dg Chest Port 1 View  Result Date: 01/10/2018 CLINICAL DATA:  Central catheter placement. EXAM: PORTABLE CHEST 1 VIEW COMPARISON:  October 15, 2003 FINDINGS: Central catheter tip is in the superior vena cava. Nasogastric tube tip and side port are below the diaphragm. No pneumothorax. There is a moderate layering effusion on the right. Lungs elsewhere are clear. Heart is upper normal in size with pulmonary vascularity normal. No adenopathy. No evident bone lesions. IMPRESSION: Tube and catheter positions as described without pneumothorax. Moderate layering effusion on the right. Lungs elsewhere clear. Heart upper normal in size. Electronically Signed   By: Lowella Grip III M.D.   On: 01/10/2018 08:05    Anti-infectives: Anti-infectives (From admission, onward)   Start      Dose/Rate Route Frequency Ordered Stop   01/10/18 0400  piperacillin-tazobactam (ZOSYN) IVPB 3.375 g  Status:  Discontinued     3.375 g 12.5 mL/hr over 240 Minutes Intravenous Every 8 hours 01/09/18 2108 01/10/18 0401   01/09/18 2245  piperacillin-tazobactam (ZOSYN) IVPB 3.375 g  Status:  Discontinued     3.375 g 100 mL/hr over 30 Minutes Intravenous  Once 01/09/18 2243 01/09/18 2255   01/09/18 2100  piperacillin-tazobactam (ZOSYN) IVPB 3.375 g     3.375 g 100 mL/hr over 30 Minutes Intravenous  Once 01/09/18 2056 01/09/18 2219       Assessment/Plan R colonic perforation - s/p R colectomy and drainage of retroperitoneal abscess - POD#1 - minimal output from NGT, removed - keep NPO and await return of bowel function - mobilize as tolerated and get OOB - continue IV abx and drain - recheck labs in AM - IV pain control until able to tolerate PO  FEN: NPO, IVF VTE: SCDs, lovenox ID: IV Zosyn 4/29>>  Plan: Pt stable for transfer to floor. NPO. IV abx. Mobilize.   LOS: 0 days    Brigid Re , [facility] 01/10/2018, 11:00 AM Pager: [phone] Consults: [phone] Mon-Fri 7:00 am-4:30 pm Sat-Sun 7:00 am-11:30 am

## 2018-01-10 NOTE — Op Note (Signed)
Preoperative diagnosis: Perforated viscus Postoperative diagnosis: Right colonic perforation, possible perforated appendicitis Procedure: 1.  Right colectomy 2.  Drainage of retroperitoneal abscess 3.  Placement of abdominal wound VAC Surgeon: Dr. Serita Grammes Anesthesia: General Specimens: Right colon to pathology Drains: 19 French Blake drain to retroperitoneal abscess Estimated blood loss: 50 cc Complications: None Sponge needle count was correct at completion Disposition: I asked for a stepdown bed.  These were not available so he will go to an intensive care unit bed overnight  Indications: This is a 67 year old healthy male who presents with several days of worsening abdominal pain.  He has an elevated lactate.  His creatinine is elevated.  His white blood cell count is significantly elevated as well.  He has peritonitis on his examination as well as tenderness around towards his right flank.  On his CT scan he had perforation what appeared to be in his right colon or it could be his appendix.  There was a lot of air more than I would expect with an appendix.  I discussed with him due to the fact that he was ill with peritonitis going to the operating room for likely would be a right colectomy.  Procedure: After informed consent was obtained the patient was taken to the operating room.  He was placed under anesthesia without complication.  He was already given antibiotics in the emergency room.  SCDs were in place.  A nasogastric tube and a Foley catheter were placed.  A right internal jugular central line was placed by anesthesia for access.  We then prepped and draped him in the standard sterile surgical fashion.  Surgical timeout was then performed.  I made a midline incision.  This included his umbilical hernia that was present and very small.  I then carried this into the abdomen.  I then inserted a Bookwalter retractor.  I eviscerated the small bowel.  This was without any injury.  I  then went to his right lower quadrant.  His cecum was stuck to his retroperitoneum.  I had to finger fracture of the cecum off of the retroperitoneum where it was stuck.  Once I had done this there was a hole in the cecum that was the source of the infection and perforation.  The appendix was not really able to be identified.  I then explored the cavity and from a hole that started near his pelvic brim lateral to his right psoas this went up almost behind his liver.  There was a fair amount of purulence and foul-smelling fluid that was present.  I elected at this point to proceed with a right colectomy.  I found a portion of the terminal ileum and divided that with a GIA stapler.  I took down the white line of Toldt in its entirety and rolled the right colon medially.  I took the omentum off of the transverse colon and found an appropriate site in the transverse colon and divided this with a GIA stapler as well.  I then used a combination of the ligasure device as well as suture ties to ligate the major vessels to take down the mesentery of the right colon.  This was then removed and passed off the table.  The remainder of the colon appeared to be grossly normal.  The stomach appeared to be normal.  The nasogastric tube is in good position.  I then irrigated with about 10 L.  This was clear once I had completed this.  I tried to debride  as much of the retroperitoneum as possible.  I then ran the bowel again.  There was no evidence of any injury.  I then brought the small bowel in approximation with the transverse colon using 3-0 silk suture.  I then made enterotomies in both.  I use a GIA stapler to create an anastomosis.  This was done in a side-to-side fashion.  This was hemostatic.  I then closed the common enterotomy at the end with a TX stapler.  This was closed completely.  This appeared to be healthy.  I then closed the mesenteric defect with 2-0 silk sutures and obliterated this.  I then placed two 3-0 silk  sutures at the apex to take tension off the anastomosis.  This was healthy upon completion.  I then placed a 62 [place] Blake drain into the retroperitoneal abscess and brought this out of the right side of the abdomen.  I secured this with 2-0 nylon suture.  I then irrigated some more.  I evacuated of much of this is possible.  I then placed the omentum overlying the right lower quadrant.  I then closed with #1 looped PDS.  I did not want to close the skin due to the infection that was present.  So I placed an abdominal wound VAC overlying the fascia and secured this with the tape.  The drain was also covered with a Biopatch.  He tolerated this well.  He will be extubated and transferred to the ICU in stable condition.  I asked for a stepdown bed that these are not available I do not think he is safe to go to the floor tonight.

## 2018-01-11 ENCOUNTER — Other Ambulatory Visit: Payer: Self-pay

## 2018-01-11 ENCOUNTER — Encounter (HOSPITAL_COMMUNITY): Payer: Self-pay | Admitting: General Practice

## 2018-01-11 LAB — CBC
HCT: 33.3 % — ABNORMAL LOW (ref 39.0–52.0)
Hemoglobin: 11.7 g/dL — ABNORMAL LOW (ref 13.0–17.0)
MCH: 31.5 pg (ref 26.0–34.0)
MCHC: 35.1 g/dL (ref 30.0–36.0)
MCV: 89.5 fL (ref 78.0–100.0)
Platelets: 208 10*3/uL (ref 150–400)
RBC: 3.72 MIL/uL — ABNORMAL LOW (ref 4.22–5.81)
RDW: 12.7 % (ref 11.5–15.5)
WBC: 15.6 10*3/uL — ABNORMAL HIGH (ref 4.0–10.5)

## 2018-01-11 LAB — BASIC METABOLIC PANEL
Anion gap: 6 (ref 5–15)
BUN: 18 mg/dL (ref 6–20)
CO2: 24 mmol/L (ref 22–32)
Calcium: 7.8 mg/dL — ABNORMAL LOW (ref 8.9–10.3)
Chloride: 106 mmol/L (ref 101–111)
Creatinine, Ser: 0.84 mg/dL (ref 0.61–1.24)
GFR calc Af Amer: >60 mL/min (ref >60)
GFR calc non Af Amer: >60 mL/min (ref >60)
Glucose, Bld: 157 mg/dL — ABNORMAL HIGH (ref 65–99)
Potassium: 3.9 mmol/L (ref 3.5–5.1)
Sodium: 136 mmol/L (ref 135–145)

## 2018-01-11 LAB — GLUCOSE, CAPILLARY
GLUCOSE-CAPILLARY: 213 mg/dL — AB (ref 65–99)
GLUCOSE-CAPILLARY: 124 mg/dL — AB (ref 65–99)
Glucose-Capillary: 151 mg/dL — ABNORMAL HIGH (ref 65–99)
Glucose-Capillary: 137 mg/dL — ABNORMAL HIGH (ref 65–99)

## 2018-01-11 LAB — LACTIC ACID, PLASMA: Lactic Acid, Venous: 2.1 mmol/L (ref 0.5–1.9)

## 2018-01-11 MED ORDER — METHOCARBAMOL 1000 MG/10ML IJ SOLN
500.0000 mg | Freq: Three times a day (TID) | INTRAVENOUS | Status: DC
  Administered 2018-01-11: 500 mg via INTRAVENOUS

## 2018-01-11 MED ORDER — HYDROMORPHONE HCL 2 MG/ML IJ SOLN: 0.5000 mg | INTRAMUSCULAR | Status: DC | PRN

## 2018-01-11 MED ORDER — KETOROLAC TROMETHAMINE 15 MG/ML IJ SOLN
15.0000 mg | Freq: Three times a day (TID) | INTRAMUSCULAR | Status: DC
  Administered 2018-01-11 – 2018-01-12 (×4): 15 mg via INTRAVENOUS
  Filled 2018-01-11 (×4): qty 1

## 2018-01-11 MED ORDER — DEXTROSE 5 % IV SOLN
500.0000 mg | Freq: Three times a day (TID) | INTRAVENOUS | Status: DC
  Administered 2018-01-11 – 2018-01-20 (×24): 500 mg via INTRAVENOUS
  Filled 2018-01-11 (×2): qty 5
  Filled 2018-01-11: qty 550
  Filled 2018-01-11 (×4): qty 5
  Filled 2018-01-11 (×2): qty 550
  Filled 2018-01-11 (×4): qty 5
  Filled 2018-01-11: qty 550
  Filled 2018-01-11 (×5): qty 5
  Filled 2018-01-11: qty 550
  Filled 2018-01-11 (×4): qty 5

## 2018-01-11 NOTE — Care Management Note (Signed)
Case Management Note  Patient Details  Name: BRITNEY CAPTAIN MRN: [id number] Date of Birth: 1950/10/23  Subjective/Objective:                    Action/Plan: Following for discharge needs . [facility] application for home VAC completed except for wound measurements and MD/PA signature .   Await PT eval.  Expected Discharge Date:                  Expected Discharge Plan:     In-House Referral:     Discharge planning Services  CM Consult  Post Acute Care Choice:    Choice offered to:     DME Arranged:  Vac DME Agency:  [facility]  HH Arranged:    [place] Agency:     Status of Service:  In process, will continue to follow  If discussed at Long Length of Stay Meetings, dates discussed:    Additional Comments:  Marilu Favre, RN 01/11/2018, 10:54 AM

## 2018-01-11 NOTE — Progress Notes (Signed)
CRITICAL VALUE ALERT  Critical Value:  Lactic Acid   Date & Time Notied:  01/11/2018 12:40  Provider Notified: Jackson Latino, PA  Orders Received/Actions taken: No changes, Pt rcving fluids at 100 ml/hr will continue. Value is down from 2.9.

## 2018-01-11 NOTE — Evaluation (Signed)
Physical Therapy Evaluation Patient Details Name: Alexander Pollard MRN: [id number] DOB: Aug 30, 1951 Today's Date: 01/11/2018   History of Present Illness  Pt is a 67 y/o male who presents with R sided abdominal pain. He was found to have a retroperitoneal abscess and is now s/p R colectomy and drainage of abscess. PMH significant for enlarged aorta, dilated aortic root.   Clinical Impression  Pt admitted with above diagnosis. Pt currently with functional limitations due to the deficits listed below (see PT Problem List). At the time of PT eval pt was limited by pain when moving in the transverse or sagittal planes. Overall pt is mobilizing well and feel he will benefit from continued ambulation with mobility tech. Physical therapy will keep on caseload and periodically check in to ensure pt is progressing and not regressing. We discussed use of the RW for now, not necessarily for balance, but more for energy conservation and to take some pressure off of the abdomen during OOB mobility. Anticipate he will progress well. Pt will benefit from skilled PT to increase their independence and safety with mobility to allow discharge to the venue listed below.       Follow Up Recommendations Home health PT;Supervision - Intermittent    Equipment Recommendations  Rolling walker with 5" wheels;3in1 (PT)    Recommendations for Other Services OT consult     Precautions / Restrictions Precautions Precautions: Fall Precaution Comments: Wound VAC, JP drain Restrictions Weight Bearing Restrictions: No      Mobility  Bed Mobility               General bed mobility comments: Pt sitting up in recliner upon PT arrival. Pt was verbally educated on log roll technique for comfort.   Transfers Overall transfer level: Modified independent Equipment used: Rolling walker (2 wheeled)             General transfer comment: Pt was able to power-up to full stand without assistance. Increased time but  generally steady.   Ambulation/Gait Ambulation/Gait assistance: Supervision Ambulation Distance (Feet): 250 Feet Assistive device: Rolling walker (2 wheeled) Gait Pattern/deviations: Step-through pattern;Decreased stride length;Trunk flexed Gait velocity: Decreased Gait velocity interpretation: 1.31 - 2.62 ft/sec, indicative of limited community ambulator General Gait Details: Pt was cued for improved posture. Difficult to make corrective changes due to pain. Assist only for management of IV pole/wound VAC.   Stairs            Wheelchair Mobility    Modified Rankin (Stroke Patients Only)       Balance Overall balance assessment: No apparent balance deficits (not formally assessed)                                           Pertinent Vitals/Pain Pain Assessment: No/denies pain(At rest)    Home Living Family/patient expects to be discharged to:: Private residence Living Arrangements: Alone Available Help at Discharge: Family;Available PRN/intermittently Type of Home: Apartment Home Access: Stairs to enter Entrance Stairs-Rails: Can reach both Entrance Stairs-Number of Steps: 7 Home Layout: One level Home Equipment: None      Prior Function Level of Independence: Independent         Comments: Part time teacher and tutor      Hand Dominance   Dominant Hand: Right    Extremity/Trunk Assessment   Upper Extremity Assessment Upper Extremity Assessment: Overall WFL for tasks assessed  Lower Extremity Assessment Lower Extremity Assessment: Generalized weakness    Cervical / Trunk Assessment Cervical / Trunk Assessment: Other exceptions Cervical / Trunk Exceptions: s/p abdominal surgery  Communication   Communication: No difficulties  Cognition Arousal/Alertness: Awake/alert Behavior During Therapy: WFL for tasks assessed/performed Overall Cognitive Status: Within Functional Limits for tasks assessed                                         General Comments      Exercises Other Exercises Other Exercises: Pt was instructed in gentle, low range trunk extension to prevent tightening of tissue around incision site and improve standing posture.   Assessment/Plan    PT Assessment Patient needs continued PT services  PT Problem List Decreased strength;Decreased range of motion;Decreased activity tolerance;Decreased balance;Decreased mobility;Decreased knowledge of use of DME;Decreased safety awareness;Decreased knowledge of precautions;Pain       PT Treatment Interventions DME instruction;Gait training;Stair training;Functional mobility training;Therapeutic activities;Therapeutic exercise;Neuromuscular re-education;Patient/family education    PT Goals (Current goals can be found in the Care Plan section)  Acute Rehab PT Goals Patient Stated Goal: Heal his incisional wound PT Goal Formulation: With patient Time For Goal Achievement: 01/18/18 Potential to Achieve Goals: Good    Frequency Min 1X/week   Barriers to discharge        Co-evaluation               AM-PAC PT "6 Clicks" Daily Activity  Outcome Measure Difficulty turning over in bed (including adjusting bedclothes, sheets and blankets)?: A Little Difficulty moving from lying on back to sitting on the side of the bed? : A Little Difficulty sitting down on and standing up from a chair with arms (e.g., wheelchair, bedside commode, etc,.)?: A Little Help needed moving to and from a bed to chair (including a wheelchair)?: A Little Help needed walking in hospital room?: A Little Help needed climbing 3-5 steps with a railing? : A Little 6 Click Score: 18    End of Session Equipment Utilized During Treatment: (Gait belt deferred due to abdominal incision) Activity Tolerance: Patient tolerated treatment well Patient left: in chair;with call bell/phone within reach Nurse Communication: Mobility status PT Visit Diagnosis: Pain;Muscle  weakness (generalized) (M62.81) Pain - part of body: (Abdomen)    Time: 2094-7096 PT Time Calculation (min) (ACUTE ONLY): 24 min   Charges:   PT Evaluation $PT Eval Moderate Complexity: 1 Mod PT Treatments $Gait Training: 8-22 mins   PT G Codes:        Rolinda Roan, PT, DPT Acute Rehabilitation Services Pager: [place] 01/11/2018, 1:29 PM

## 2018-01-11 NOTE — Plan of Care (Signed)
  Problem: Safety: Goal: Ability to remain free from injury will improve Outcome: Progressing   Problem: Clinical Measurements: Goal: Ability to maintain clinical measurements within normal limits will improve Outcome: Progressing Goal: Postoperative complications will be avoided or minimized Outcome: Progressing   Problem: Clinical Measurements: Goal: Postoperative complications will be avoided or minimized Outcome: Progressing

## 2018-01-11 NOTE — Progress Notes (Addendum)
[facility]/Trauma Progress Note  2 Days Post-Op   Assessment/Plan Dilated aortic root Hemochromatosis HTN  R colonic perforation  - s/p R colectomy and drainage of retroperitoneal abscess - NPO and await return of bowel function - mobilize as tolerated and get OOB - continue IV abx and drain - recheck labs in AM - IV pain control until able to tolerate PO  FEN: NPO, IVF, Ice chips VTE: SCDs, lovenox ID: IV Zosyn 4/29>> Foley: dc today Follow up: Dr. Georgette Dover  Plan: NPO. IV abx. Mobilize. await return of bowel function. High drain output. Added toradol for pain control. WBC 15.6, afebrile.     LOS: 1 day    Subjective: CC: Right sided abdominal pain  Pt states pain unchanged of right side. Pain is severe with movement or cough. No nausea or vomiting. No flatus. Pt has not been out of bed. He is afraid to mobilize due to pain.  Objective: Vital signs in last 24 hours: Temp:  [96.4 F (35.8 C)-98.3 F (36.8 C)] 97.3 F (36.3 C) (05/01 0611) Pulse Rate:  [77-100] 77 (05/01 0611) Resp:  [16-27] 18 (05/01 0611) BP: (95-112)/(63-83) 100/67 (05/01 0611) SpO2:  [96 %-98 %] 96 % (05/01 0611) Last BM Date: 01/08/18  Intake/Output from previous day: 04/30 0701 - 05/01 0700 In: 769 [I.V.:610; NG/GT:30; IV Piggyback:129] Out: 1390 [Urine:685; Emesis/NG output:30; Drains:675] Intake/Output this shift: No intake/output data recorded.  PE: Gen:  Alert, NAD, pleasant, cooperative Card:  RRR, no M/G/R heard Pulm:  CTA, no W/R/R, rate and effort normal Abd: Soft, ND, no BS appreciated, midline incision with wound vac, drain with copious purulent sanguinous drainage, no abdominal scars noted, moderate TTP of RLQ with guarding. No signs of peritonitis Extremities: no TTP of calves b/l Skin: no rashes noted, warm and dry   Anti-infectives: Anti-infectives (From admission, onward)   Start     Dose/Rate Route Frequency Ordered Stop   01/10/18 1130   piperacillin-tazobactam (ZOSYN) IVPB 3.375 g     3.375 g 12.5 mL/hr over 240 Minutes Intravenous Every 8 hours 01/10/18 1123     01/10/18 0400  piperacillin-tazobactam (ZOSYN) IVPB 3.375 g  Status:  Discontinued     3.375 g 12.5 mL/hr over 240 Minutes Intravenous Every 8 hours 01/09/18 2108 01/10/18 0401   01/09/18 2245  piperacillin-tazobactam (ZOSYN) IVPB 3.375 g  Status:  Discontinued     3.375 g 100 mL/hr over 30 Minutes Intravenous  Once 01/09/18 2243 01/09/18 2255   01/09/18 2100  piperacillin-tazobactam (ZOSYN) IVPB 3.375 g     3.375 g 100 mL/hr over 30 Minutes Intravenous  Once 01/09/18 2056 01/09/18 2219      Lab Results:  Recent Labs    01/10/18 0600 01/11/18 0432  WBC 19.1* 15.6*  HGB 13.7 11.7*  HCT 39.0 33.3*  PLT 271 208   BMET Recent Labs    01/10/18 0600 01/11/18 0432  NA 132* 136  K 3.8 3.9  CL 101 106  CO2 23 24  GLUCOSE 217* 157*  BUN 16 18  CREATININE 0.90 0.84  CALCIUM 8.0* 7.8*   PT/INR No results for input(s): LABPROT, INR in the last 72 hours. CMP     Component Value Date/Time   NA 136 01/11/2018 0432   NA 141 09/10/2015 1117   K 3.9 01/11/2018 0432   CL 106 01/11/2018 0432   CO2 24 01/11/2018 0432   GLUCOSE 157 (H) 01/11/2018 0432   BUN 18 01/11/2018 0432   BUN 14 09/10/2015 1117  CREATININE 0.84 01/11/2018 0432   CREATININE 0.90 03/19/2015 0925   CALCIUM 7.8 (L) 01/11/2018 0432   PROT 7.6 01/09/2018 2008   PROT 6.5 09/10/2015 1117   ALBUMIN 3.0 (L) 01/09/2018 2008   ALBUMIN 4.5 09/10/2015 1117   AST 41 01/09/2018 2008   ALT 41 01/09/2018 2008   ALKPHOS 95 01/09/2018 2008   BILITOT 2.7 (H) 01/09/2018 2008   BILITOT 1.3 (H) 09/10/2015 1117   GFRNONAA >60 01/11/2018 0432   GFRAA >60 01/11/2018 0432   Lipase     Component Value Date/Time   LIPASE 35 01/09/2018 2008    Studies/Results: Ct Abdomen Pelvis W Contrast  Result Date: 01/09/2018 CLINICAL DATA:  Right lower quadrant abdominal pain x3 days EXAM: CT ABDOMEN AND  PELVIS WITH CONTRAST TECHNIQUE: Multidetector CT imaging of the abdomen and pelvis was performed using the standard protocol following bolus administration of intravenous contrast. CONTRAST:  157mL OMNIPAQUE IOHEXOL 300 MG/ML  SOLN COMPARISON:  Abdominal ultrasound dated 05/28/2014 FINDINGS: Lower chest: Small right pleural effusion. Mild patchy right lower lobe opacity (series 5/image 6), atelectasis versus pneumonia. Hepatobiliary: Two probable 5 mm cysts in the liver (series 3/image 16). Gallbladder is unremarkable. No intrahepatic or extrahepatic ductal dilatation. Pancreas: Within normal limits. Spleen: Splenomegaly, measuring 15.7 cm in craniocaudal dimension. Adrenals/Urinary Tract: Adrenal glands are within normal limits. Kidneys within normal limits.  No hydronephrosis. Bladder is within normal limits. Stomach/Bowel: Stomach is notable for a small hiatal hernia. No evidence of bowel obstruction. Abnormal appendix with surrounding inflammatory changes and gas, suggesting perforated acute appendicitis. A 5 mm appendicolith may be present distally (series 3/image 86). Notably, the infection invades the right retroperitoneal space, with a poorly formed 5.7 x 5.8 x 6.5 cm fluid and gas collection lateral to the right psoas muscle (series 3/image 58), without a well-defined rim/wall to suggest a true abscess. Additional gas extends superiorly along the right lateral abdominal wall (series 3/image 33). Notably, this degree of gas (as well as the retroperitoneal location) are both unusual in the setting of acute appendicitis, but the inflammatory changes are centered at the appendix/base of the cecum. Vascular/Lymphatic: No evidence of abdominal aortic aneurysm. Atherosclerotic calcifications of the abdominal aorta and branch vessels. No suspicious abdominopelvic lymphadenopathy. Reproductive: Prostate is unremarkable. Other: Additional stranding along the right kidney. No pelvic ascites. Musculoskeletal:  Visualized osseous structures are within normal limits. IMPRESSION: Perforated acute appendicitis with a suspected 5 mm distal appendicolith. Notably, the infection invades the retroperitoneal space with a poorly formed 6.5 cm fluid and gas collection lateral to the right psoas muscle. Additional gas extends superiorly along the right lateral abdominal wall. These results were called by telephone at the time of interpretation on 01/09/2018 at 10:58 PM to Dr. Wilson Singer, who verbally acknowledged these results. Electronically Signed   By: Julian Hy M.D.   On: 01/09/2018 23:02   Dg Chest Port 1 View  Result Date: 01/10/2018 CLINICAL DATA:  Central catheter placement. EXAM: PORTABLE CHEST 1 VIEW COMPARISON:  October 15, 2003 FINDINGS: Central catheter tip is in the superior vena cava. Nasogastric tube tip and side port are below the diaphragm. No pneumothorax. There is a moderate layering effusion on the right. Lungs elsewhere are clear. Heart is upper normal in size with pulmonary vascularity normal. No adenopathy. No evident bone lesions. IMPRESSION: Tube and catheter positions as described without pneumothorax. Moderate layering effusion on the right. Lungs elsewhere clear. Heart upper normal in size. Electronically Signed   By: Lowella Grip III M.D.  On: 01/10/2018 08:05      Kalman Drape , [facility] Surgery 01/11/2018, 8:09 AM  Pager: [phone] Mon-Wed, Friday 7:00am-4:30pm Thurs 7am-11:30am  Consults: [phone]

## 2018-01-12 LAB — CBC
HEMATOCRIT: 31.2 % — AB (ref 39.0–52.0)
Hemoglobin: 10.9 g/dL — ABNORMAL LOW (ref 13.0–17.0)
MCH: 31.1 pg (ref 26.0–34.0)
MCHC: 34.9 g/dL (ref 30.0–36.0)
MCV: 88.9 fL (ref 78.0–100.0)
Platelets: 243 10*3/uL (ref 150–400)
RBC: 3.51 MIL/uL — ABNORMAL LOW (ref 4.22–5.81)
RDW: 12.8 % (ref 11.5–15.5)
WBC: 17.4 10*3/uL — AB (ref 4.0–10.5)

## 2018-01-12 LAB — GLUCOSE, CAPILLARY
GLUCOSE-CAPILLARY: 149 mg/dL — AB (ref 65–99)
Glucose-Capillary: 120 mg/dL — ABNORMAL HIGH (ref 65–99)
Glucose-Capillary: 140 mg/dL — ABNORMAL HIGH (ref 65–99)
Glucose-Capillary: 124 mg/dL — ABNORMAL HIGH (ref 65–99)

## 2018-01-12 MED ORDER — ACETAMINOPHEN 325 MG PO TABS
650.0000 mg | ORAL_TABLET | Freq: Four times a day (QID) | ORAL | Status: DC
  Administered 2018-01-12 – 2018-01-18 (×24): 650 mg via ORAL
  Administered 2018-01-18: 325 mg via ORAL
  Administered 2018-01-19 – 2018-01-23 (×18): 650 mg via ORAL
  Filled 2018-01-12 (×43): qty 2

## 2018-01-12 MED ORDER — GUAIFENESIN-DM 100-10 MG/5ML PO SYRP
5.0000 mL | ORAL_SOLUTION | ORAL | Status: DC | PRN
  Administered 2018-01-12: 5 mL via ORAL
  Filled 2018-01-12: qty 5

## 2018-01-12 MED ORDER — HYDROMORPHONE HCL 2 MG/ML IJ SOLN
0.5000 mg | INTRAMUSCULAR | Status: DC | PRN
  Administered 2018-01-13 – 2018-01-14 (×2): 0.5 mg via INTRAVENOUS
  Filled 2018-01-12 (×2): qty 1

## 2018-01-12 MED ORDER — ALUM & MAG HYDROXIDE-SIMETH 200-200-20 MG/5ML PO SUSP
30.0000 mL | Freq: Four times a day (QID) | ORAL | Status: DC | PRN
  Administered 2018-01-12 (×2): 30 mL via ORAL
  Filled 2018-01-12 (×2): qty 30

## 2018-01-12 MED ORDER — GUAIFENESIN-DM 100-10 MG/5ML PO SYRP
10.0000 mL | ORAL_SOLUTION | ORAL | Status: DC | PRN
  Administered 2018-01-12 – 2018-01-15 (×3): 10 mL via ORAL
  Filled 2018-01-12 (×3): qty 10

## 2018-01-12 MED ORDER — OXYCODONE HCL 5 MG PO TABS
5.0000 mg | ORAL_TABLET | ORAL | Status: DC | PRN
Start: 2018-01-12 — End: 2018-01-17
  Administered 2018-01-13: 5 mg via ORAL
  Administered 2018-01-15 – 2018-01-17 (×6): 10 mg via ORAL
  Filled 2018-01-12 (×2): qty 2
  Filled 2018-01-12: qty 1
  Filled 2018-01-12 (×5): qty 2

## 2018-01-12 NOTE — Consult Note (Signed)
            [facility] CM Primary Care Navigator  01/12/2018  Alexander Pollard June 20, 1951 [id number]   Seen patient at the bedsideto identify possible discharge needs. Patientreports having"terrible/ unbearable right abdominal pain"thathadled to this admission/ surgery.(right colonic perforation , status post right colectomy and drainage of retroperitoneal abscess)  [place] [facility] primary care provider.   [facility] [address] [facility].   Patientstates that he manageshis medications at home straight out of the containers.  Patient reports thathehas been driving prior to admission/ surgerybuthis son Alexander Pollard) will providetransportation tohisdoctors'appointments after discharge.  Patient livesat home alone. He states being physically active and independent with self care. His son, daughter Alexander Pollard) and ex- wife Alexander Pollard) will be able to assist with his care needs at home when needed after discharge.  Anticipated plan for discharge ishomewith home health services per therapy recommendation.  Patient voiced understanding to call primary care provider's office whenhereturns home,for a post discharge follow-up appointment within1- 2 weeksor sooner if needs arise.Patient letter (with PCP's contact number) was provided asareminder.  Explained topatient regardingTHN CM services available for health managementat homebuthedenies anyneeds or concernsfor now. Patientexpressedunderstandingto seek referral from primary care provider to [facility] care management ifnecessaryand deemed appropriate for anyservices in the future.  [facility] care management information provided for future needs thathemay have.  Patienthowever,verbally agreedand optedforEMMIcalls tofollowup hisrecoveryat home.  Referral made for [facility] General calls after  discharge.   For additional questions please contact:  Edwena Felty A. Matalyn Nawaz, BSN, RN-BC [facility] PRIMARY CARE Navigator Cell: [phone]

## 2018-01-12 NOTE — Progress Notes (Signed)
Patient ID: Alexander Pollard, male   DOB: 1951-06-11, 67 y.o.   MRN: [id number] Talked to patient, discussed surgery and pathology with patient.

## 2018-01-12 NOTE — Progress Notes (Signed)
[facility]/Tra[facility] Progress Note  3 Days Post-Op   Assessment/Plan Dilated aortic root Hemochromatosis HTN  R colonic perforation  - s/p R colectomy and drainage of retroperitoneal abscess - NPO and await return of bowel function - mobilize as tolerated and get OOB - continue IV abx and drain - recheck labs in AM - IV pain control until able to tolerate PO  FEN: clears, IVF VTE: SCDs, lovenox ID: IV Zosyn 4/29>>     WBC up to 17.4 from 15.6 yesterday (05/01), afebrile.  Foley: dc 05/01 Follow up: Dr. Georgette Dover  Plan:IV abx. Mobilize.await return of bowel function. High drain output.    LOS: 2 days    Subjective: CC: abdominal pain  Pt states pain unchanged of right side. Pain is severe with movement or cough. No nausea or vomiting. No flatus. He is tolerating clears. Some hiccups last night. He feels his belly is a little bloated.     Objective: Vital signs in last 24 hours: Temp:  [98 F (36.7 C)-98.5 F (36.9 C)] 98.5 F (36.9 C) (05/02 0439) Pulse Rate:  [89-104] 89 (05/02 0439) Resp:  [16-18] 16 (05/02 0439) BP: (115-122)/(70-72) 115/72 (05/02 0439) SpO2:  [96 %-100 %] 96 % (05/02 0439) Last BM Date: 01/08/18  Intake/Output from previous day: 05/01 0701 - 05/02 0700 In: 3085 [P.O.:120; I.V.:2460; IV Piggyback:355] Out: 6226 [Urine:1225; Drains:280; Blood:100] Intake/Output this shift: No intake/output data recorded.  PE: Gen:  Alert, NAD, pleasant, cooperative Card:  RRR, no M/G/R heard Pulm:  CTA, no W/R/R, rate and effort normal Abd: Soft, mild distention, good BS, midline incision with wound vac, drain with copious mildly purulent sanguinous drainage, moderate TTP of RLQ with guarding. No signs of peritonitis Extremities: no TTP of calves b/l Skin: no rashes noted, warm and dry   Anti-infectives: Anti-infectives (From admission, onward)   Start     Dose/Rate Route Frequency Ordered Stop   01/10/18 1130  piperacillin-tazobactam  (ZOSYN) IVPB 3.375 g     3.375 g 12.5 mL/hr over 240 Minutes Intravenous Every 8 hours 01/10/18 1123     01/10/18 0400  piperacillin-tazobactam (ZOSYN) IVPB 3.375 g  Status:  Discontinued     3.375 g 12.5 mL/hr over 240 Minutes Intravenous Every 8 hours 01/09/18 2108 01/10/18 0401   01/09/18 2245  piperacillin-tazobactam (ZOSYN) IVPB 3.375 g  Status:  Discontinued     3.375 g 100 mL/hr over 30 Minutes Intravenous  Once 01/09/18 2243 01/09/18 2255   01/09/18 2100  piperacillin-tazobactam (ZOSYN) IVPB 3.375 g     3.375 g 100 mL/hr over 30 Minutes Intravenous  Once 01/09/18 2056 01/09/18 2219      Lab Results:  Recent Labs    01/11/18 0432 01/12/18 0438  WBC 15.6* 17.4*  HGB 11.7* 10.9*  HCT 33.3* 31.2*  PLT 208 243   BMET Recent Labs    01/10/18 0600 01/11/18 0432  NA 132* 136  K 3.8 3.9  CL 101 106  CO2 23 24  GLUCOSE 217* 157*  BUN 16 18  CREATININE 0.90 0.84  CALCIUM 8.0* 7.8*   PT/INR No results for input(s): LABPROT, INR in the last 72 hours. CMP     Component Value Date/Time   NA 136 01/11/2018 0432   NA 141 09/10/2015 1117   K 3.9 01/11/2018 0432   CL 106 01/11/2018 0432   CO2 24 01/11/2018 0432   GLUCOSE 157 (H) 01/11/2018 0432   BUN 18 01/11/2018 0432   BUN 14 09/10/2015 1117  CREATININE 0.84 01/11/2018 0432   CREATININE 0.90 03/19/2015 0925   CALCIUM 7.8 (L) 01/11/2018 0432   PROT 7.6 01/09/2018 2008   PROT 6.5 09/10/2015 1117   ALBUMIN 3.0 (L) 01/09/2018 2008   ALBUMIN 4.5 09/10/2015 1117   AST 41 01/09/2018 2008   ALT 41 01/09/2018 2008   ALKPHOS 95 01/09/2018 2008   BILITOT 2.7 (H) 01/09/2018 2008   BILITOT 1.3 (H) 09/10/2015 1117   GFRNONAA >60 01/11/2018 0432   GFRAA >60 01/11/2018 0432   Lipase     Component Value Date/Time   LIPASE 35 01/09/2018 2008    Studies/Results: No results found.    Kalman Drape , [facility] Surgery 01/12/2018, 8:27 AM  Pager: [phone] Mon-Wed, Friday 7:00am-4:30pm Thurs  7am-11:30am  Consults: [phone]

## 2018-01-13 LAB — CBC
HEMATOCRIT: 31.1 % — AB (ref 39.0–52.0)
HEMOGLOBIN: 10.6 g/dL — AB (ref 13.0–17.0)
MCH: 30.7 pg (ref 26.0–34.0)
MCHC: 34.1 g/dL (ref 30.0–36.0)
MCV: 90.1 fL (ref 78.0–100.0)
Platelets: 257 10*3/uL (ref 150–400)
RBC: 3.45 MIL/uL — ABNORMAL LOW (ref 4.22–5.81)
RDW: 12.9 % (ref 11.5–15.5)
WBC: 16.2 10*3/uL — ABNORMAL HIGH (ref 4.0–10.5)

## 2018-01-13 LAB — GLUCOSE, CAPILLARY
GLUCOSE-CAPILLARY: 108 mg/dL — AB (ref 65–99)
Glucose-Capillary: 106 mg/dL — ABNORMAL HIGH (ref 65–99)
Glucose-Capillary: 108 mg/dL — ABNORMAL HIGH (ref 65–99)
Glucose-Capillary: 145 mg/dL — ABNORMAL HIGH (ref 65–99)

## 2018-01-13 NOTE — Care Management Note (Addendum)
Case Management Note  Patient Details  Name: Alexander Pollard MRN: [id number] Date of Birth: 06/19/1951  Subjective/Objective:                    Action/Plan:  Confirmed face sheet information with patient at bedside.   Discussed home health.   Discussed if he needs negative wound pressure system at discharge NCM will order. Home VAC will be delivered to patient's room before discharge along with a box of supplies. Bedside nurse would change him from hospital [facility] to home [facility] then he would have a home health nurse to change dressing three times a week.   If patient discharges to home with wet to dry dressing changes , hospital RN will teach him and family how to do wet to dry dressing changes , because HHRN could not make daily visits. Patient lives alone but has a son and daughter who are available to assist him.   Patient agreeable to walker and 3 in 1 . Will order through [facility] and have delivered to his room.   Patient voiced understanding to all of above.    Negative wound pressure system VAC application form completed except for wound measurements and MD/PA signature. Jessica with [facility] aware. Will need home health orders and face to face for [facility] and HHPT once wound care at discharge determined.  [facility] will follow.  Expected Discharge Date:                  Expected Discharge Plan:  [place]  In-House Referral:     Discharge planning Services  CM Consult  Post Acute Care Choice:  Durable Medical Equipment, Home Health Choice offered to:  Patient  DME Arranged:  Vac, 3-N-1, Walker rolling DME Agency:  [place]:    [place] Agency:  [place]  Status of Service:  In process, will continue to follow  If discussed at Long Length of Stay Meetings, dates discussed:    Additional Comments:  Marilu Favre, RN 01/13/2018, 10:47 AM

## 2018-01-13 NOTE — Progress Notes (Addendum)
[facility]/Tra[facility] Progress Note  4 Days Post-Op   Assessment/Plan Dilated aortic root Hemochromatosis HTN  R colonic perforation  - s/p R colectomy and drainage of retroperitoneal abscess, Dr. Donne Hazel, 04/30 - path discussed with pt, appendicitis  - mobilize as tolerated and get OOB - continue IV abx and drain - labs in AM  FEN: FLD, IVF VTE: SCDs, lovenox ID: IV Zosyn 4/29>>     afebrile.WBC pending Foley: dc 05/01 Follow up: Dr. Georgette Dover  Plan:IV abx. Mobilize.continue drain. Advance to FLD. PT recommending HH PT   LOS: 3 days    Subjective: CC: abdominal pain  Pt states pain is about the same of his right side. No nausea or vomiting. Had 2 runny Bm's that he stated were dark but he does not believe there was any blood. He is tolerating clears. No fever or chills. No new complaints.   Objective: Vital signs in last 24 hours: Temp:  [97.5 F (36.4 C)-98.4 F (36.9 C)] 97.5 F (36.4 C) (05/03 0417) Pulse Rate:  [69-78] 73 (05/03 0417) Resp:  [16-18] 16 (05/03 0417) BP: (110-131)/(69-79) 131/78 (05/03 0417) SpO2:  [97 %-99 %] 97 % (05/03 0417) Last BM Date: 01/13/18  Intake/Output from previous day: 05/02 0701 - 05/03 0700 In: 2720.8 [P.O.:480; I.V.:1875.8; IV Piggyback:365] Out: 520 [Urine:270; Drains:250] Intake/Output this shift: No intake/output data recorded.  PE: Gen: Alert, NAD, pleasant, cooperative Card: RRR, no M/G/R heard Pulm: CTA, no W/R/R, rate andeffort normal Abd: Soft, mild distention,good BS,midlineincision with wound vac,drain with scant purulent sanguinous drainage, moderate TTP of RLQ without guarding. No signs of peritonitis Extremities: no TTP of calves b/l Skin: no rashes noted, warm and dry    Anti-infectives: Anti-infectives (From admission, onward)   Start     Dose/Rate Route Frequency Ordered Stop   01/10/18 1130  piperacillin-tazobactam (ZOSYN) IVPB 3.375 g     3.375 g 12.5 mL/hr over 240 Minutes  Intravenous Every 8 hours 01/10/18 1123     01/10/18 0400  piperacillin-tazobactam (ZOSYN) IVPB 3.375 g  Status:  Discontinued     3.375 g 12.5 mL/hr over 240 Minutes Intravenous Every 8 hours 01/09/18 2108 01/10/18 0401   01/09/18 2245  piperacillin-tazobactam (ZOSYN) IVPB 3.375 g  Status:  Discontinued     3.375 g 100 mL/hr over 30 Minutes Intravenous  Once 01/09/18 2243 01/09/18 2255   01/09/18 2100  piperacillin-tazobactam (ZOSYN) IVPB 3.375 g     3.375 g 100 mL/hr over 30 Minutes Intravenous  Once 01/09/18 2056 01/09/18 2219      Lab Results:  Recent Labs    01/11/18 0432 01/12/18 0438  WBC 15.6* 17.4*  HGB 11.7* 10.9*  HCT 33.3* 31.2*  PLT 208 243   BMET Recent Labs    01/11/18 0432  NA 136  K 3.9  CL 106  CO2 24  GLUCOSE 157*  BUN 18  CREATININE 0.84  CALCIUM 7.8*   PT/INR No results for input(s): LABPROT, INR in the last 72 hours. CMP     Component Value Date/Time   NA 136 01/11/2018 0432   NA 141 09/10/2015 1117   K 3.9 01/11/2018 0432   CL 106 01/11/2018 0432   CO2 24 01/11/2018 0432   GLUCOSE 157 (H) 01/11/2018 0432   BUN 18 01/11/2018 0432   BUN 14 09/10/2015 1117   CREATININE 0.84 01/11/2018 0432   CREATININE 0.90 03/19/2015 0925   CALCIUM 7.8 (L) 01/11/2018 0432   PROT 7.6 01/09/2018 2008   PROT 6.5 09/10/2015 1117  ALBUMIN 3.0 (L) 01/09/2018 2008   ALBUMIN 4.5 09/10/2015 1117   AST 41 01/09/2018 2008   ALT 41 01/09/2018 2008   ALKPHOS 95 01/09/2018 2008   BILITOT 2.7 (H) 01/09/2018 2008   BILITOT 1.3 (H) 09/10/2015 1117   GFRNONAA >60 01/11/2018 0432   GFRAA >60 01/11/2018 0432   Lipase     Component Value Date/Time   LIPASE 35 01/09/2018 2008    Studies/Results: No results found.    Kalman Drape , [facility] Surgery 01/13/2018, 8:22 AM  Pager: [phone] Mon-Wed, Friday 7:00am-4:30pm Thurs 7am-11:30am  Consults: [phone]

## 2018-01-13 NOTE — Progress Notes (Signed)
ANTIBIOTIC CONSULT NOTE   Pharmacy Consult for Zosyn Indication: Intra-abdominal infection  No Known Allergies  Patient Measurements: Height: 5\' 8"  (172.7 cm) Weight: 170 lb (77.1 kg) IBW/kg (Calculated) : 68.4 Adjusted Body Weight:   Vital Signs: Temp: 97.5 F (36.4 C) (05/03 0417) Temp Source: Oral (05/03 0417) BP: 131/78 (05/03 0417) Pulse Rate: 73 (05/03 0417) Intake/Output from previous day: 05/02 0701 - 05/03 0700 In: 2720.8 [P.O.:480; I.V.:1875.8; IV Piggyback:365] Out: 520 [Urine:270; Drains:250] Intake/Output from this shift: No intake/output data recorded.  Labs: Recent Labs    01/11/18 0432 01/12/18 0438  WBC 15.6* 17.4*  HGB 11.7* 10.9*  PLT 208 243  CREATININE 0.84  --    Estimated Creatinine Clearance: 83.7 mL/min (by C-G formula based on SCr of 0.84 mg/dL). No results for input(s): VANCOTROUGH, VANCOPEAK, VANCORANDOM, GENTTROUGH, GENTPEAK, GENTRANDOM, TOBRATROUGH, TOBRAPEAK, TOBRARND, AMIKACINPEAK, AMIKACINTROU, AMIKACIN in the last 72 hours.   Microbiology:   Medical History: Past Medical History:  Diagnosis Date  . Dilated aortic root ([place])   . Dyslipidemia    a. h/o low HDL.  . Enlarged aorta ([place])   . Family history of early CAD   . 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 Marked hypertensive response to exercise by ETT 06/2016 prompting addition of Toprol. b. F/u amb BP monitor normal.   Assessment:  ID: D#4, s/p OR for bowel perf - afebrile, WBC up 17.4, LA 2.1. Scr 0.84 with CrCl 83.  Zosyn 4/30 >>  4/29 BCx - NGTD 4/30 MRSA PCR: negative  Goal of Therapy:  Eradication of infection  Plan:  Continue Zosyn 3.375g IV q 8hrs. Dose ok down to a CrCl of 20. Pharmacy will sign off. Please reconsult for further dosing assitance.    Riaan Toledo S. Alford Highland, PharmD, BCPS Clinical Staff Pharmacist Pager [phone]  Eilene Ghazi  [place] 01/13/2018,8:06 AM

## 2018-01-13 NOTE — Care Management Important Message (Signed)
Important Message  Patient Details  Name: Alexander Pollard MRN: [id number] Date of Birth: 02-Jun-1951   Medicare Important Message Given:  Yes    Orbie Pyo 01/13/2018, 4:26 PM

## 2018-01-14 LAB — CBC
HCT: 32.7 % — ABNORMAL LOW (ref 39.0–52.0)
Hemoglobin: 11.1 g/dL — ABNORMAL LOW (ref 13.0–17.0)
MCH: 30.7 pg (ref 26.0–34.0)
MCHC: 33.9 g/dL (ref 30.0–36.0)
MCV: 90.3 fL (ref 78.0–100.0)
PLATELETS: 259 10*3/uL (ref 150–400)
RBC: 3.62 MIL/uL — AB (ref 4.22–5.81)
RDW: 12.9 % (ref 11.5–15.5)
WBC: 17.4 10*3/uL — AB (ref 4.0–10.5)

## 2018-01-14 LAB — GLUCOSE, CAPILLARY
Glucose-Capillary: 94 mg/dL (ref 65–99)
Glucose-Capillary: 137 mg/dL — ABNORMAL HIGH (ref 65–99)
Glucose-Capillary: 149 mg/dL — ABNORMAL HIGH (ref 65–99)

## 2018-01-14 MED ORDER — ZOLPIDEM TARTRATE 5 MG PO TABS
5.0000 mg | ORAL_TABLET | Freq: Once | ORAL | Status: AC
  Administered 2018-01-14: 5 mg via ORAL
  Filled 2018-01-14: qty 1

## 2018-01-14 NOTE — Progress Notes (Signed)
[facility]/Trauma Progress Note  5 Days Post-Op   Assessment/Plan Dilated aortic root Hemochromatosis HTN  R colonic perforation  - s/p R colectomy and drainage of retroperitoneal abscess, Dr. Donne Hazel, 04/30 - path discussed with pt, appendicitis  - mobilize as tolerated and get OOB - continue IV abx and drain - labs in AM  FEN:reg diet.  IVF VTE: SCDs, lovenox ID: IV Zosyn 4/29>>afebrile.WBC up slightly to 17.4 from 16.2 Foley: dc05/01 Follow up: Dr. Georgette Dover  Plan:IV abx. Mobilize.continue drain. Advance diet. PT recommending HH PT. WBC up slightly. Patient continues to improve and will hopefully be discharged home within a few days. Drain will need to be continued at discharge.    LOS: 4 days    Subjective: CC: abdominal pain  Pt states pain is improved. No nausea or vomiting. having bowel movements.He is tolerating full liquid diet. No fever or chills. No new complaints. patient is asking when he can be discharged  Objective: Vital signs in last 24 hours: Temp:  [98.3 F (36.8 C)-99.1 F (37.3 C)] 99.1 F (37.3 C) (05/04 0453) Pulse Rate:  [70-90] 90 (05/04 0453) Resp:  [16-18] 16 (05/04 0453) BP: (137-143)/(82-94) 143/94 (05/04 0453) SpO2:  [94 %-100 %] 94 % (05/04 0453) Last BM Date: 01/13/18  Intake/Output from previous day: 05/03 0701 - 05/04 0700 In: 520 [P.O.:360; IV Piggyback:160] Out: 475 [Urine:400; Drains:75] Intake/Output this shift: No intake/output data recorded.  PE: Gen: Alert, NAD, pleasant, cooperative Card: RRR, no M/G/R heard Pulm: CTA, no W/R/R, rate andeffort normal Abd: Soft,mild distention,good BS,midlineincision with wet-to-dry packing. Midline wound with good base of granulation tissue of superior aspect and slough noted to inferior aspect,drain with scantpurulentsanguinous drainage, improved TTP of RLQ without guarding. No signs of peritonitis Extremities: no TTP of calves b/l Skin: no  rashes noted, warm and dry   Anti-infectives: Anti-infectives (From admission, onward)   Start     Dose/Rate Route Frequency Ordered Stop   01/10/18 1130  piperacillin-tazobactam (ZOSYN) IVPB 3.375 g     3.375 g 12.5 mL/hr over 240 Minutes Intravenous Every 8 hours 01/10/18 1123     01/10/18 0400  piperacillin-tazobactam (ZOSYN) IVPB 3.375 g  Status:  Discontinued     3.375 g 12.5 mL/hr over 240 Minutes Intravenous Every 8 hours 01/09/18 2108 01/10/18 0401   01/09/18 2245  piperacillin-tazobactam (ZOSYN) IVPB 3.375 g  Status:  Discontinued     3.375 g 100 mL/hr over 30 Minutes Intravenous  Once 01/09/18 2243 01/09/18 2255   01/09/18 2100  piperacillin-tazobactam (ZOSYN) IVPB 3.375 g     3.375 g 100 mL/hr over 30 Minutes Intravenous  Once 01/09/18 2056 01/09/18 2219      Lab Results:  Recent Labs    01/13/18 0918 01/14/18 0445  WBC 16.2* 17.4*  HGB 10.6* 11.1*  HCT 31.1* 32.7*  PLT 257 259   BMET No results for input(s): NA, K, CL, CO2, GLUCOSE, BUN, CREATININE, CALCIUM in the last 72 hours. PT/INR No results for input(s): LABPROT, INR in the last 72 hours. CMP     Component Value Date/Time   NA 136 01/11/2018 0432   NA 141 09/10/2015 1117   K 3.9 01/11/2018 0432   CL 106 01/11/2018 0432   CO2 24 01/11/2018 0432   GLUCOSE 157 (H) 01/11/2018 0432   BUN 18 01/11/2018 0432   BUN 14 09/10/2015 1117   CREATININE 0.84 01/11/2018 0432   CREATININE 0.90 03/19/2015 0925   CALCIUM 7.8 (L) 01/11/2018 0432   PROT 7.6  01/09/2018 2008   PROT 6.5 09/10/2015 1117   ALBUMIN 3.0 (L) 01/09/2018 2008   ALBUMIN 4.5 09/10/2015 1117   AST 41 01/09/2018 2008   ALT 41 01/09/2018 2008   ALKPHOS 95 01/09/2018 2008   BILITOT 2.7 (H) 01/09/2018 2008   BILITOT 1.3 (H) 09/10/2015 1117   GFRNONAA >60 01/11/2018 0432   GFRAA >60 01/11/2018 0432   Lipase     Component Value Date/Time   LIPASE 35 01/09/2018 2008    Studies/Results: No results found.    Kalman Drape ,  [facility] 01/14/2018, 9:19 AM  Pager: [phone] Mon-Wed, Friday 7:00am-4:30pm Thurs 7am-11:30am  Consults: [phone]

## 2018-01-15 ENCOUNTER — Inpatient Hospital Stay (HOSPITAL_COMMUNITY): Payer: PPO

## 2018-01-15 LAB — GLUCOSE, CAPILLARY
GLUCOSE-CAPILLARY: 114 mg/dL — AB (ref 65–99)
GLUCOSE-CAPILLARY: 138 mg/dL — AB (ref 65–99)
GLUCOSE-CAPILLARY: 126 mg/dL — AB (ref 65–99)
GLUCOSE-CAPILLARY: 94 mg/dL (ref 65–99)

## 2018-01-15 LAB — BASIC METABOLIC PANEL
ANION GAP: 9 (ref 5–15)
BUN: 9 mg/dL (ref 6–20)
CO2: 25 mmol/L (ref 22–32)
Calcium: 8 mg/dL — ABNORMAL LOW (ref 8.9–10.3)
Chloride: 101 mmol/L (ref 101–111)
Creatinine, Ser: 0.71 mg/dL (ref 0.61–1.24)
GFR calc Af Amer: >60 mL/min (ref >60)
GLUCOSE: 134 mg/dL — AB (ref 65–99)
Potassium: 3.8 mmol/L (ref 3.5–5.1)
Sodium: 135 mmol/L (ref 135–145)

## 2018-01-15 LAB — CBC
HCT: 36.2 % — ABNORMAL LOW (ref 39.0–52.0)
HEMOGLOBIN: 12.2 g/dL — AB (ref 13.0–17.0)
MCH: 30.7 pg (ref 26.0–34.0)
MCHC: 33.7 g/dL (ref 30.0–36.0)
MCV: 91.2 fL (ref 78.0–100.0)
Platelets: 268 10*3/uL (ref 150–400)
RBC: 3.97 MIL/uL — ABNORMAL LOW (ref 4.22–5.81)
RDW: 13 % (ref 11.5–15.5)
WBC: 20.7 10*3/uL — ABNORMAL HIGH (ref 4.0–10.5)

## 2018-01-15 LAB — CULTURE, BLOOD (ROUTINE X 2)
CULTURE: NO GROWTH
Culture: NO GROWTH
SPECIAL REQUESTS: ADEQUATE
SPECIAL REQUESTS: ADEQUATE

## 2018-01-15 MED ORDER — ZOLPIDEM TARTRATE 5 MG PO TABS
5.0000 mg | ORAL_TABLET | Freq: Every evening | ORAL | Status: DC | PRN
  Administered 2018-01-15 – 2018-01-18 (×2): 5 mg via ORAL
  Filled 2018-01-15 (×2): qty 1

## 2018-01-15 MED ORDER — IOHEXOL 300 MG/ML  SOLN
100.0000 mL | Freq: Once | INTRAMUSCULAR | Status: AC | PRN
  Administered 2018-01-15: 100 mL via INTRAVENOUS

## 2018-01-15 MED ORDER — IOPAMIDOL (ISOVUE-300) INJECTION 61%
30.0000 mL | Freq: Once | INTRAVENOUS | Status: AC | PRN
  Administered 2018-01-15: 30 mL via ORAL

## 2018-01-15 NOTE — Progress Notes (Signed)
[facility]/Trauma Progress Note  6 Days Post-Op   Assessment/Plan Dilated aortic root Hemochromatosis HTN  R colonic perforation  - s/p R colectomy and drainage of retroperitoneal abscess, Dr. Donne Hazel, 04/30 - path discussed with pt, appendicitis - mobilize as tolerated and get OOB - continue IV abx and drain - labs in AM  FEN:reg diet VTE: SCDs, lovenox ID: IV Zosyn 4/29>>afebrile.WBC up slightly to 17.4 05/04 from 16.2, am labs pending Foley: dc05/01 Follow up: Dr. Georgette Dover  Plan:IV abx. Mobilize.continue drain. PT recommending HH PT. WBC up slightly am labs pending. If WBC continues to climb pt will need a repeat CT scan. Discussed with pt. Patient continues to improve clinically and will hopefully be discharged home within a few days pending WBC. Drain will need to be continued at discharge.     LOS: 5 days    Subjective: CC: abdominal pain  Pt states painis improved. No nausea or vomiting.having bowel movements.He is tolerating a regular diet.No fever or chills. No new complaints. Discussed with patient that if his WBC continues to climb that he will need a repeat CT scan.   Objective: Vital signs in last 24 hours: Temp:  [98.5 F (36.9 C)-98.7 F (37.1 C)] 98.5 F (36.9 C) (05/04 2146) Pulse Rate:  [90-96] 90 (05/04 2146) Resp:  [20-189] 20 (05/04 2146) BP: (138-147)/(79-83) 147/83 (05/04 2146) SpO2:  [97 %-100 %] 97 % (05/04 2146) Last BM Date: 01/13/18  Intake/Output from previous day: 05/04 0701 - 05/05 0700 In: 682 [P.O.:582; IV Piggyback:100] Out: 185 [Drains:185] Intake/Output this shift: No intake/output data recorded.  PE: Gen: Alert, NAD, pleasant, cooperative Card: RRR, no M/G/R heard Pulm: CTA, no W/R/R, rate andeffort normal Abd: Soft,mild distention,good BS,midlineincision with wet-to-dry packing. Midline wound with good base of granulation tissue of superior aspect and slough noted to inferior  aspect, no purulent drainage noted.drain withscantpurulentsanguinous drainage, improved TTP of RLQ withoutguarding. No signs of peritonitis Skin: no rashes noted, warm and dry    Anti-infectives: Anti-infectives (From admission, onward)   Start     Dose/Rate Route Frequency Ordered Stop   01/10/18 1130  piperacillin-tazobactam (ZOSYN) IVPB 3.375 g     3.375 g 12.5 mL/hr over 240 Minutes Intravenous Every 8 hours 01/10/18 1123     01/10/18 0400  piperacillin-tazobactam (ZOSYN) IVPB 3.375 g  Status:  Discontinued     3.375 g 12.5 mL/hr over 240 Minutes Intravenous Every 8 hours 01/09/18 2108 01/10/18 0401   01/09/18 2245  piperacillin-tazobactam (ZOSYN) IVPB 3.375 g  Status:  Discontinued     3.375 g 100 mL/hr over 30 Minutes Intravenous  Once 01/09/18 2243 01/09/18 2255   01/09/18 2100  piperacillin-tazobactam (ZOSYN) IVPB 3.375 g     3.375 g 100 mL/hr over 30 Minutes Intravenous  Once 01/09/18 2056 01/09/18 2219      Lab Results:  Recent Labs    01/13/18 0918 01/14/18 0445  WBC 16.2* 17.4*  HGB 10.6* 11.1*  HCT 31.1* 32.7*  PLT 257 259   BMET No results for input(s): NA, K, CL, CO2, GLUCOSE, BUN, CREATININE, CALCIUM in the last 72 hours. PT/INR No results for input(s): LABPROT, INR in the last 72 hours. CMP     Component Value Date/Time   NA 136 01/11/2018 0432   NA 141 09/10/2015 1117   K 3.9 01/11/2018 0432   CL 106 01/11/2018 0432   CO2 24 01/11/2018 0432   GLUCOSE 157 (H) 01/11/2018 0432   BUN 18 01/11/2018 0432   BUN 14  09/10/2015 1117   CREATININE 0.84 01/11/2018 0432   CREATININE 0.90 03/19/2015 0925   CALCIUM 7.8 (L) 01/11/2018 0432   PROT 7.6 01/09/2018 2008   PROT 6.5 09/10/2015 1117   ALBUMIN 3.0 (L) 01/09/2018 2008   ALBUMIN 4.5 09/10/2015 1117   AST 41 01/09/2018 2008   ALT 41 01/09/2018 2008   ALKPHOS 95 01/09/2018 2008   BILITOT 2.7 (H) 01/09/2018 2008   BILITOT 1.3 (H) 09/10/2015 1117   GFRNONAA >60 01/11/2018 0432   GFRAA >60  01/11/2018 0432   Lipase     Component Value Date/Time   LIPASE 35 01/09/2018 2008    Studies/Results: No results found.    Kalman Drape , [facility] 01/15/2018, 9:21 AM  Pager: [phone] Mon-Wed, Friday 7:00am-4:30pm Thurs 7am-11:30am  Consults: [phone]

## 2018-01-16 ENCOUNTER — Inpatient Hospital Stay (HOSPITAL_COMMUNITY): Payer: PPO

## 2018-01-16 ENCOUNTER — Encounter (HOSPITAL_COMMUNITY): Payer: Self-pay | Admitting: Student

## 2018-01-16 HISTORY — PX: IR THORACENTESIS ASP PLEURAL SPACE W/IMG GUIDE: IMG5380

## 2018-01-16 LAB — CBC
HCT: 32.5 % — ABNORMAL LOW (ref 39.0–52.0)
Hemoglobin: 11.1 g/dL — ABNORMAL LOW (ref 13.0–17.0)
MCH: 30.9 pg (ref 26.0–34.0)
MCHC: 34.2 g/dL (ref 30.0–36.0)
MCV: 90.5 fL (ref 78.0–100.0)
PLATELETS: 242 10*3/uL (ref 150–400)
RBC: 3.59 MIL/uL — ABNORMAL LOW (ref 4.22–5.81)
RDW: 12.9 % (ref 11.5–15.5)
WBC: 15.3 10*3/uL — AB (ref 4.0–10.5)

## 2018-01-16 LAB — GLUCOSE, CAPILLARY
GLUCOSE-CAPILLARY: 127 mg/dL — AB (ref 65–99)
Glucose-Capillary: 90 mg/dL (ref 65–99)
Glucose-Capillary: 130 mg/dL — ABNORMAL HIGH (ref 65–99)
Glucose-Capillary: 107 mg/dL — ABNORMAL HIGH (ref 65–99)

## 2018-01-16 MED ORDER — LIDOCAINE HCL (PF) 1 % IJ SOLN
INTRAMUSCULAR | Status: AC | PRN
  Administered 2018-01-16 – 2018-01-18 (×2): 10 mL

## 2018-01-16 MED ORDER — LIDOCAINE HCL (PF) 2 % IJ SOLN
INTRAMUSCULAR | Status: AC
  Filled 2018-01-16: qty 20

## 2018-01-16 NOTE — Discharge Instructions (Addendum)
MIDLINE WOUND CARE: - midline dressing to be changed once daily - supplies: sterile saline, kerlix, scissors, ABD pads, tape  - remove dressing and all packing carefully, moistening with sterile saline as needed to avoid packing/internal dressing sticking to the wound. - clean edges of skin around the wound with water/gauze, making sure there is no tape debris or leakage left on skin that could cause skin irritation or breakdown. - dampen and clean kerlix with sterile saline and pack wound from wound base to skin level, making sure to take note of any possible areas of wound tracking, tunneling and packing appropriately. Wound can be packed loosely. Trim kerlix to size if a whole kerlix is not required. - cover wound with a dry ABD pad and secure with tape.  - write the date/time on the dry dressing/tape to better track when the last dressing change occurred. - apply any skin protectant/powder recommended by clinician to protect skin/skin folds. - change dressing as needed if leakage occurs, wound gets contaminated, or patient requests to shower. - patient may shower daily with wound open and following the shower the wound should be dried and a clean dressing placed.     Surgical [facility] Surgical drains are used to remove extra fluid that normally builds up in a surgical wound after surgery. A surgical drain helps to heal a surgical wound. Different kinds of surgical drains include:  Active drains. These drains use suction to pull drainage away from the surgical wound. Drainage flows through a tube to a container outside of the body. It is important to keep the bulb or the drainage container flat (compressed) at all times, except while you empty it. Flattening the bulb or container creates suction. The two most common types of active drains are bulb drains and Hemovac drains.  Passive drains. These drains allow fluid to drain naturally, by gravity. Drainage flows through a tube to a  bandage (dressing) or a container outside of the body. Passive drains do not need to be emptied. The most common type of passive drain is the Penrose drain.  A drain is placed during surgery. Immediately after surgery, drainage is usually bright red and a little thicker than water. The drainage may gradually turn yellow or pink and become thinner. It is likely that your health care provider will remove the drain when the drainage stops or when the amount decreases to 1-2 Tbsp (15-30 mL) during a 24-hour period. How to care for your surgical drain  Keep the skin around the drain dry and covered with a dressing at all times.  Check your drain area every day for signs of infection. Check for: ? More redness, swelling, or pain. ? Pus or a bad smell. ? Cloudy drainage. Follow instructions from your health care provider about how to take care of your drain and how to change your dressing. Change your dressing at least one time every day. Change it more often if needed to keep the dressing dry. Make sure you: 1. Gather your supplies, including: ? Tape. ? Germ-free cleaning solution (sterile saline). ? Split gauze drain sponge: 4 x 4 inches (10 x 10 cm). ? Gauze square: 4 x 4 inches (10 x 10 cm). 2. Wash your hands with soap and water before you change your dressing. If soap and water are not available, use hand sanitizer. 3. Remove the old dressing. Avoid using scissors to do that. 4. Use sterile saline to clean your skin around the drain. 5. Place the  tube through the slit in a drain sponge. Place the drain sponge so that it covers your wound. 6. Place the gauze square or another drain sponge on top of the drain sponge that is on the wound. Make sure the tube is between those layers. 7. Tape the dressing to your skin. 8. If you have an active bulb or Hemovac drain, tape the drainage tube to your skin 1-2 inches (2.5-5 cm) below the place where the tube enters your body. Taping keeps the tube from  pulling on any stitches (sutures) that you have. 9. Wash your hands with soap and water. 10. Write down the color of your drainage and how often you change your dressing.  How to empty your active bulb or Hemovac drain 1. Make sure that you have a measuring cup that you can empty your drainage into. 2. Wash your hands with soap and water. If soap and water are not available, use hand sanitizer. 3. Gently move your fingers down the tube while squeezing very lightly. This is called stripping the tube. This clears any drainage, clots, or tissue from the tube. ? Do not pull on the tube. ? You may need to strip the tube several times every day to keep the tube clear. 4. Open the bulb cap or the drain plug. Do not touch the inside of the cap or the bottom of the plug. 5. Empty all of the drainage into the measuring cup. 6. Compress the bulb or the container and replace the cap or the plug. To compress the bulb or the container, squeeze it firmly in the middle while you close the cap or plug the container. 7. Write down the amount of drainage that you have in each 24-hour period. If you have less than 2 Tbsp (30 mL) of drainage during 24 hours, contact your health care provider. 8. Flush the drainage down the toilet. 9. Wash your hands with soap and water. Contact a health care provider if:  You have more redness, swelling, or pain around your drain area.  The amount of drainage that you have is increasing instead of decreasing.  You have pus or a bad smell coming from your drain area.  You have a fever.  You have drainage that is cloudy.  There is a sudden stop or a sudden decrease in the amount of drainage that you have.  Your tube falls out.  Your active draindoes not stay compressedafter you empty it. This information is not intended to replace advice given to you by your health care provider. Make sure you discuss any questions you have with your health care provider. Document  Released: 08/27/2000 Document Revised: 02/05/2016 Document Reviewed: 03/19/2015 Elsevier Interactive Patient Education  2018 [facility], [place] [phone]  OPEN ABDOMINAL SURGERY: POST OP INSTRUCTIONS  Always review your discharge instruction sheet given to you by the facility where your surgery was performed.  IF YOU HAVE DISABILITY OR FAMILY LEAVE FORMS, YOU MUST BRING THEM TO THE OFFICE FOR PROCESSING.  PLEASE DO NOT GIVE THEM TO YOUR DOCTOR.  1. A prescription for pain medication may be given to you upon discharge.  Take your pain medication as prescribed, if needed.  If narcotic pain medicine is not needed, then you may take acetaminophen (Tylenol) or ibuprofen (Advil) as needed. 2. Take your usually prescribed medications unless otherwise directed. 3. If you need a refill on your pain medication, please contact your pharmacy. They  will contact our office to request authorization.  Prescriptions will not be filled after 5pm or on week-ends. 4. You should follow a light diet the first few days after arrival home, such as soup and crackers, pudding, etc.unless your doctor has advised otherwise. A high-fiber, low fat diet can be resumed as tolerated.   Be sure to include lots of fluids daily. Most patients will experience some swelling and bruising on the chest and neck area.  Ice packs will help.  Swelling and bruising can take several days to resolve 5. Most patients will experience some swelling and bruising in the area of the incision. Ice pack will help. Swelling and bruising can take several days to resolve..  6. It is common to experience some constipation if taking pain medication after surgery.  Increasing fluid intake and taking a stool softener will usually help or prevent this problem from occurring.  A mild laxative (Milk of Magnesia or Miralax) should be taken according to package directions if there are no bowel movements after 48 hours. 7.   You may have steri-strips (small skin tapes) in place directly over the incision.  These strips should be left on the skin for 7-10 days.  If your surgeon used skin glue on the incision, you may shower in 24 hours.  The glue will flake off over the next 2-3 weeks.  Any sutures or staples will be removed at the office during your follow-up visit. You may find that a light gauze bandage over your incision may keep your staples from being rubbed or pulled. You may shower and replace the bandage daily. 8. ACTIVITIES:  You may resume regular (light) daily activities beginning the next day--such as daily self-care, walking, climbing stairs--gradually increasing activities as tolerated.  You may have sexual intercourse when it is comfortable.  Refrain from any heavy lifting or straining until approved by your doctor. a. You may drive when you no longer are taking prescription pain medication, you can comfortably wear a seatbelt, and you can safely maneuver your car and apply brakes b. Return to Work: ___________________________________ 67. You should see your doctor in the office for a follow-up appointment approximately two weeks after your surgery.  Make sure that you call for this appointment within a day or two after you arrive home to insure a convenient appointment time. OTHER INSTRUCTIONS:  _____________________________________________________________ _____________________________________________________________  WHEN TO CALL YOUR DOCTOR: 1. Fever over 101.0 2. Inability to urinate 3. Nausea and/or vomiting 4. Extreme swelling or bruising 5. Continued bleeding from incision. 6. Increased pain, redness, or drainage from the incision. 7. Difficulty swallowing or breathing 8. Muscle cramping or spasms. 9. Numbness or tingling in hands or feet or around lips.  The clinic staff is available to answer your questions during regular business hours.  Please dont hesitate to call and ask to speak to one of  the nurses if you have concerns.  For further questions, please visit [email or web address]

## 2018-01-16 NOTE — Progress Notes (Signed)
Physical Therapy Treatment Patient Details Name: Alexander Pollard MRN: [id number] DOB: 06/19/1951 Today's Date: 01/16/2018    History of Present Illness Pt is a 67 y/o male who presents with R sided abdominal pain. He was found to have a retroperitoneal abscess and is now s/p R colectomy and drainage of abscess. PMH significant for enlarged aorta, dilated aortic root. Pt now s/p thoracentesis on 01/16/18 and 1.0L yellow fluid was drained.     PT Comments    Pt progressing slowly towards physical therapy goals. Was able to ambulate without the RW this session however continue to feel he would benefit from an AD. Will try the [facility] next session in preparation for d/c home. Will continue to follow.    Follow Up Recommendations  Home health PT;Supervision - Intermittent     Equipment Recommendations  3in1 (PT);Cane    Recommendations for Other Services OT consult     Precautions / Restrictions Precautions Precautions: Fall Restrictions Weight Bearing Restrictions: No    Mobility  Bed Mobility Overal bed mobility: Needs Assistance Bed Mobility: Supine to Sit;Sit to Supine     Supine to sit: Min guard Sit to supine: Min assist   General bed mobility comments: Pt was instructed in log roll for comfort. Pt continued to attempt supine<>sit. Assist for LE elevation up into bed at end of session.   Transfers Overall transfer level: Needs assistance Equipment used: None Transfers: Sit to/from Stand Sit to Stand: Supervision         General transfer comment: Supervision for safety as pt powered up to full stand.   Ambulation/Gait Ambulation/Gait assistance: Supervision Ambulation Distance (Feet): 250 Feet Assistive device: None Gait Pattern/deviations: Step-through pattern;Decreased stride length;Trunk flexed Gait velocity: Decreased Gait velocity interpretation: <1.31 ft/sec, indicative of household ambulator General Gait Details: Slow and guarded. Pt used IV pole for support. He  was cued for improved posture throughout.    Stairs             Wheelchair Mobility    Modified Rankin (Stroke Patients Only)       Balance Overall balance assessment: No apparent balance deficits (not formally assessed)                                          Cognition Arousal/Alertness: Awake/alert Behavior During Therapy: WFL for tasks assessed/performed Overall Cognitive Status: Within Functional Limits for tasks assessed                                        Exercises      General Comments        Pertinent Vitals/Pain Pain Assessment: Faces Faces Pain Scale: Hurts little more Pain Location: Abdomen Pain Descriptors / Indicators: Operative site guarding Pain Intervention(s): Monitored during session    Home Living                      Prior Function            PT Goals (current goals can now be found in the care plan section) Acute Rehab PT Goals Patient Stated Goal: Heal his incisional wound PT Goal Formulation: With patient Time For Goal Achievement: 01/18/18 Potential to Achieve Goals: Good Progress towards PT goals: Progressing toward goals    Frequency  Min 3X/week      PT Plan Frequency needs to be updated    Co-evaluation              AM-PAC PT "6 Clicks" Daily Activity  Outcome Measure  Difficulty turning over in bed (including adjusting bedclothes, sheets and blankets)?: A Little Difficulty moving from lying on back to sitting on the side of the bed? : A Little Difficulty sitting down on and standing up from a chair with arms (e.g., wheelchair, bedside commode, etc,.)?: A Little Help needed moving to and from a bed to chair (including a wheelchair)?: A Little Help needed walking in hospital room?: A Little Help needed climbing 3-5 steps with a railing? : A Little 6 Click Score: 18    End of Session Equipment Utilized During Treatment: Gait belt Activity Tolerance:  Patient tolerated treatment well Patient left: in bed;with call bell/phone within reach Nurse Communication: Mobility status PT Visit Diagnosis: Pain;Muscle weakness (generalized) (M62.81) Pain - part of body: (Abdomen)     Time: 1275-1700 PT Time Calculation (min) (ACUTE ONLY): 21 min  Charges:  $Gait Training: 8-22 mins                    G Codes:       Rolinda Roan, PT, DPT Acute Rehabilitation Services Pager: [place] 01/16/2018, 3:49 PM

## 2018-01-16 NOTE — Procedures (Signed)
PROCEDURE SUMMARY:  Successful US guided diagnostic and therapeutic right thoracentesis. Yielded 1.0 liters of yellow fluid. Pt tolerated procedure well. No immediate complications.  Specimen was sent for labs. CXR ordered.  Docia Barrier PA-C 01/16/2018 1:01 PM

## 2018-01-16 NOTE — Progress Notes (Signed)
[facility] Progress Note  7 Days Post-Op  Subjective: CC- cough Patient states that his abdomen is sore but he denies any true pain. States that each day it has gotten a little better. Tolerating regular diet. Denies n/v. He had multiple BMs yesterday. He does report a productive cough. Denies SOB. CT scan yesterday showed small amount of free fluid in the deep pelvis and right upper pelvis without enhancement to suggest abscess; moderate to large right pleural effusion with compressive atelectasis with development of small left pleural effusion as well. JP drain continues to drain copious amounts of purulent fluid.  WBC trending down, afebrile.  Objective: Vital signs in last 24 hours: Temp:  [97.7 F (36.5 C)-98.8 F (37.1 C)] 97.7 F (36.5 C) (05/06 0454) Pulse Rate:  [82-90] 90 (05/06 0454) Resp:  [17-18] 18 (05/06 0454) BP: (139-154)/(89-94) 139/91 (05/06 0454) SpO2:  [94 %-98 %] 94 % (05/06 0454) Last BM Date: 01/15/18  Intake/Output from previous day: 05/05 0701 - 05/06 0700 In: 639 [P.O.:484; IV Piggyback:155] Out: 430 [Urine:300; Drains:130] Intake/Output this shift: No intake/output data recorded.  PE: Gen:  Alert, NAD, pleasant HEENT: EOM's intact, pupils equal and round Card:  RRR, no M/G/R heard Pulm:  Diminished breath sounds on the right, no W/R/R, effort normal Abd: Soft, NT/ND, +BS, open midline incision cdi at proximal aspect and has some slough at distal 2/3 without purulent drainage or surrounding erythema/some visible sutures, drain with purulent fluid in bulb Ext:  Calves soft and nontender Psych: A&Ox3  Skin: no rashes noted, warm and dry  Lab Results:  Recent Labs    01/15/18 0944 01/16/18 0007  WBC 20.7* 15.3*  HGB 12.2* 11.1*  HCT 36.2* 32.5*  PLT 268 242   BMET Recent Labs    01/15/18 0944  NA 135  K 3.8  CL 101  CO2 25  GLUCOSE 134*  BUN 9  CREATININE 0.71  CALCIUM 8.0*   PT/INR No results for input(s):  LABPROT, INR in the last 72 hours. CMP     Component Value Date/Time   NA 135 01/15/2018 0944   NA 141 09/10/2015 1117   K 3.8 01/15/2018 0944   CL 101 01/15/2018 0944   CO2 25 01/15/2018 0944   GLUCOSE 134 (H) 01/15/2018 0944   BUN 9 01/15/2018 0944   BUN 14 09/10/2015 1117   CREATININE 0.71 01/15/2018 0944   CREATININE 0.90 03/19/2015 0925   CALCIUM 8.0 (L) 01/15/2018 0944   PROT 7.6 01/09/2018 2008   PROT 6.5 09/10/2015 1117   ALBUMIN 3.0 (L) 01/09/2018 2008   ALBUMIN 4.5 09/10/2015 1117   AST 41 01/09/2018 2008   ALT 41 01/09/2018 2008   ALKPHOS 95 01/09/2018 2008   BILITOT 2.7 (H) 01/09/2018 2008   BILITOT 1.3 (H) 09/10/2015 1117   GFRNONAA >60 01/15/2018 0944   GFRAA >60 01/15/2018 0944   Lipase     Component Value Date/Time   LIPASE 35 01/09/2018 2008       Studies/Results: Ct Abdomen Pelvis W Contrast  Result Date: 01/16/2018 CLINICAL DATA:  Right colonic perforation. Status post right colectomy and drainage of retroperitoneal abscess. EXAM: CT ABDOMEN AND PELVIS WITH CONTRAST TECHNIQUE: Multidetector CT imaging of the abdomen and pelvis was performed using the standard protocol following bolus administration of intravenous contrast. CONTRAST:  181mL OMNIPAQUE IOHEXOL 300 MG/ML  SOLN COMPARISON:  None. FINDINGS: Lower chest: Interval increase in bilateral pleural effusions, moderate to large on the right with compressive atelectasis and small on  the left. Heart size is normal without pericardial effusion. Hepatobiliary: Tiny 3 and 4 mm hypodensities in the left hepatic lobe, statistically consistent with cysts or hemangiomata but are too small to further characterize. There may be a small hemangioma in the right hepatic lobe with more peripheral irregular margins measuring 8 mm. The gallbladder is nondistended and free of stones. Pancreas: Mild fatty atrophy of the pancreas. Spleen: Stable splenomegaly the spleen measuring 15.6 cm AP. Adrenals/Urinary Tract: Normal  bilateral adrenal glands, kidneys and urinary bladder without obstructive uropathy. Stomach/Bowel: Status post right colectomy. Percutaneous right lower quadrant drain is identified with slightly more confluent right pelvic fluid collection measuring 3.9 x 4.1 x 5.1 cm in the upper right pelvis just caudad to the tip of the drainage catheter. Redemonstration of right-sided retroperitoneal air with a few areas of intraperitoneal gas likely related to intervention and percutaneous drainage catheter noted as well. No bowel obstruction is seen. No contrast extravasation is identified. Contrast is seen small-bowel loops presently. Intact anastomotic sutures noted. No bowel obstruction is seen. Vascular/Lymphatic: Aortic atherosclerosis without aneurysm. No adenopathy. Reproductive: Normal size prostate and seminal vesicles. Other: Small amount of free fluid in pelvis. Musculoskeletal: No acute or significant osseous findings. IMPRESSION: 1. Development of moderate to large right pleural effusion with compressive atelectasis with development of small left pleural effusion as well. 2. Status post right colectomy with intact surgical anastomosis. No bowel obstruction. 3. Small amount of free fluid in the deep pelvis and right upper pelvis without enhancement to suggest abscess. The right pelvic fluid collection is just caudad to the tip of the pre-existing drainage catheter. 4. Stable splenomegaly. Electronically Signed   By: Ashley Royalty M.D.   On: 01/16/2018 01:39    Anti-infectives: Anti-infectives (From admission, onward)   Start     Dose/Rate Route Frequency Ordered Stop   01/10/18 1130  piperacillin-tazobactam (ZOSYN) IVPB 3.375 g     3.375 g 12.5 mL/hr over 240 Minutes Intravenous Every 8 hours 01/10/18 1123     01/10/18 0400  piperacillin-tazobactam (ZOSYN) IVPB 3.375 g  Status:  Discontinued     3.375 g 12.5 mL/hr over 240 Minutes Intravenous Every 8 hours 01/09/18 2108 01/10/18 0401   01/09/18 2245   piperacillin-tazobactam (ZOSYN) IVPB 3.375 g  Status:  Discontinued     3.375 g 100 mL/hr over 30 Minutes Intravenous  Once 01/09/18 2243 01/09/18 2255   01/09/18 2100  piperacillin-tazobactam (ZOSYN) IVPB 3.375 g     3.375 g 100 mL/hr over 30 Minutes Intravenous  Once 01/09/18 2056 01/09/18 2219       Assessment/Plan Dilated aortic root Hemochromatosis HTN  Right pleural effusion - will ask IR for thoracentesis  R colonic perforation  - s/p R colectomy and drainage of retroperitoneal abscess, Dr. Donne Hazel, 04/30 - POD 6 - path: appendicitis - CT 5/5 Small amount of free fluid in the deep pelvis and right upper pelvis without enhancement to suggest abscess; moderate to large right pleural effusion with compressive atelectasis with development of small left pleural effusion as well. - WBC trending down 15.7, afebrile  FEN:reg diet VTE: SCDs, lovenox ID: IV Zosyn 4/29>> Foley: dc05/01 Follow up: Dr. Georgette Dover  Plan:IR consult pending for thoracentesis. Continue IV zosyn and JP drain. Labs in AM   LOS: 6 days    Wellington Hampshire , [facility] 01/16/2018, 8:20 AM Pager: [phone] Consults: [phone] Mon-Fri 7:00 am-4:30 pm Sat-Sun 7:00 am-11:30 am

## 2018-01-16 NOTE — Care Management Note (Signed)
Case Management Note  Patient Details  Name: VINNY TARANTO MRN: [id number] Date of Birth: [birth year]  Subjective/Objective:                    Action/Plan:  Patient will not need home VAC , plan wet to dry dressing changes. See previous note. Expected Discharge Date:                  Expected Discharge Plan:  [place]  In-House Referral:     Discharge planning Services  CM Consult  Post Acute Care Choice:  Durable Medical Equipment, Home Health Choice offered to:  Patient  DME Arranged:  3-N-1, [place] rolling DME Agency:  [place]:  RN [facility] Agency:  [place]  Status of Service:  Completed, signed off  If discussed at [place] of Stay Meetings, dates discussed:    Additional Comments:  Marilu Favre, RN 01/16/2018, 11:28 AM

## 2018-01-17 ENCOUNTER — Encounter (HOSPITAL_COMMUNITY): Admission: EM | Disposition: A | Discharge status: Home Health | Payer: Self-pay | Source: Home / Self Care

## 2018-01-17 ENCOUNTER — Inpatient Hospital Stay (HOSPITAL_COMMUNITY): Payer: PPO | Admitting: Certified Registered"

## 2018-01-17 ENCOUNTER — Inpatient Hospital Stay (HOSPITAL_COMMUNITY): Payer: PPO

## 2018-01-17 DIAGNOSIS — I77819 Aortic ectasia, unspecified site: Secondary | ICD-10-CM | POA: Diagnosis not present

## 2018-01-17 DIAGNOSIS — T8132XA Disruption of internal operation (surgical) wound, not elsewhere classified, initial encounter: Secondary | ICD-10-CM | POA: Diagnosis not present

## 2018-01-17 DIAGNOSIS — J90 Pleural effusion, not elsewhere classified: Secondary | ICD-10-CM | POA: Diagnosis not present

## 2018-01-17 HISTORY — PX: LAPAROTOMY: SHX154

## 2018-01-17 LAB — BASIC METABOLIC PANEL
Anion gap: 7 (ref 5–15)
BUN: 10 mg/dL (ref 6–20)
CHLORIDE: 101 mmol/L (ref 101–111)
CO2: 27 mmol/L (ref 22–32)
CREATININE: 0.76 mg/dL (ref 0.61–1.24)
Calcium: 7.6 mg/dL — ABNORMAL LOW (ref 8.9–10.3)
GFR calc Af Amer: >60 mL/min (ref >60)
GFR calc non Af Amer: >60 mL/min (ref >60)
GLUCOSE: 104 mg/dL — AB (ref 65–99)
POTASSIUM: 3.4 mmol/L — AB (ref 3.5–5.1)
SODIUM: 135 mmol/L (ref 135–145)

## 2018-01-17 LAB — CBC
HCT: 32.5 % — ABNORMAL LOW (ref 39.0–52.0)
HEMOGLOBIN: 10.9 g/dL — AB (ref 13.0–17.0)
MCH: 30.5 pg (ref 26.0–34.0)
MCHC: 33.5 g/dL (ref 30.0–36.0)
MCV: 91 fL (ref 78.0–100.0)
Platelets: 228 10*3/uL (ref 150–400)
RBC: 3.57 MIL/uL — AB (ref 4.22–5.81)
RDW: 13.3 % (ref 11.5–15.5)
WBC: 13.3 10*3/uL — ABNORMAL HIGH (ref 4.0–10.5)

## 2018-01-17 LAB — GLUCOSE, CAPILLARY
GLUCOSE-CAPILLARY: 108 mg/dL — AB (ref 65–99)
Glucose-Capillary: 196 mg/dL — ABNORMAL HIGH (ref 65–99)
Glucose-Capillary: 156 mg/dL — ABNORMAL HIGH (ref 65–99)

## 2018-01-17 SURGERY — LAPAROTOMY, EXPLORATORY
Anesthesia: General | Site: Abdomen

## 2018-01-17 MED ORDER — ONDANSETRON HCL 4 MG/2ML IJ SOLN
INTRAMUSCULAR | Status: DC | PRN
Start: 2018-01-17 — End: 2018-01-17
  Administered 2018-01-17: 4 mg via INTRAVENOUS

## 2018-01-17 MED ORDER — PROPOFOL 10 MG/ML IV BOLUS
INTRAVENOUS | Status: DC | PRN
  Administered 2018-01-17: 120 mg via INTRAVENOUS

## 2018-01-17 MED ORDER — ALBUTEROL SULFATE (2.5 MG/3ML) 0.083% IN NEBU
INHALATION_SOLUTION | RESPIRATORY_TRACT | Status: AC
  Filled 2018-01-17: qty 3

## 2018-01-17 MED ORDER — LACTATED RINGERS IV SOLN
INTRAVENOUS | Status: DC
Start: 2018-01-17 — End: 2018-01-23
  Administered 2018-01-17: 18:00:00 via INTRAVENOUS

## 2018-01-17 MED ORDER — POTASSIUM CHLORIDE CRYS ER 20 MEQ PO TBCR
20.0000 meq | EXTENDED_RELEASE_TABLET | Freq: Two times a day (BID) | ORAL | Status: AC
  Administered 2018-01-17 – 2018-01-18 (×4): 20 meq via ORAL
  Filled 2018-01-17 (×4): qty 1

## 2018-01-17 MED ORDER — OXYCODONE HCL 5 MG PO TABS
5.0000 mg | ORAL_TABLET | ORAL | Status: DC | PRN
  Administered 2018-01-17 – 2018-01-20 (×6): 10 mg via ORAL
  Administered 2018-01-20 – 2018-01-23 (×2): 5 mg via ORAL
  Administered 2018-01-23: 10 mg via ORAL
  Filled 2018-01-17 (×2): qty 2
  Filled 2018-01-17: qty 1
  Filled 2018-01-17 (×4): qty 2
  Filled 2018-01-17: qty 1
  Filled 2018-01-17: qty 2

## 2018-01-17 MED ORDER — LIDOCAINE HCL (CARDIAC) PF 100 MG/5ML IV SOSY
PREFILLED_SYRINGE | INTRAVENOUS | Status: DC | PRN
  Administered 2018-01-17: 60 mg via INTRAVENOUS

## 2018-01-17 MED ORDER — ALBUTEROL SULFATE (2.5 MG/3ML) 0.083% IN NEBU
2.5000 mg | INHALATION_SOLUTION | Freq: Once | RESPIRATORY_TRACT | Status: AC
  Administered 2018-01-17: 2.5 mg via RESPIRATORY_TRACT

## 2018-01-17 MED ORDER — LACTATED RINGERS IV SOLN
INTRAVENOUS | Status: DC | PRN
  Administered 2018-01-17 (×3): via INTRAVENOUS

## 2018-01-17 MED ORDER — FUROSEMIDE 10 MG/ML IJ SOLN
INTRAMUSCULAR | Status: AC
  Filled 2018-01-17: qty 4

## 2018-01-17 MED ORDER — SUGAMMADEX SODIUM 200 MG/2ML IV SOLN
INTRAVENOUS | Status: DC | PRN
  Administered 2018-01-17: 200 mg via INTRAVENOUS

## 2018-01-17 MED ORDER — PROPOFOL 10 MG/ML IV BOLUS
INTRAVENOUS | Status: AC
  Filled 2018-01-17: qty 20

## 2018-01-17 MED ORDER — SUCCINYLCHOLINE CHLORIDE 20 MG/ML IJ SOLN
INTRAMUSCULAR | Status: DC | PRN
  Administered 2018-01-17: 80 mg via INTRAVENOUS

## 2018-01-17 MED ORDER — 0.9 % SODIUM CHLORIDE (POUR BTL) OPTIME
TOPICAL | Status: DC | PRN
  Administered 2018-01-17 (×5): 1000 mL

## 2018-01-17 MED ORDER — HYDROMORPHONE HCL 2 MG/ML IJ SOLN: 0.2500 mg | INTRAMUSCULAR | Status: DC | PRN

## 2018-01-17 MED ORDER — OXYCODONE HCL 5 MG/5ML PO SOLN: 5.0000 mg | Freq: Once | ORAL | Status: DC | PRN

## 2018-01-17 MED ORDER — FUROSEMIDE 10 MG/ML IJ SOLN
20.0000 mg | Freq: Once | INTRAMUSCULAR | Status: AC
  Administered 2018-01-17: 20 mg via INTRAVENOUS

## 2018-01-17 MED ORDER — ROCURONIUM BROMIDE 100 MG/10ML IV SOLN
INTRAVENOUS | Status: DC | PRN
  Administered 2018-01-17: 30 mg via INTRAVENOUS
  Administered 2018-01-17: 10 mg via INTRAVENOUS

## 2018-01-17 MED ORDER — ALBUMIN HUMAN 5 % IV SOLN
INTRAVENOUS | Status: DC | PRN
  Administered 2018-01-17: 19:00:00 via INTRAVENOUS

## 2018-01-17 MED ORDER — HYDROMORPHONE HCL 2 MG/ML IJ SOLN
0.5000 mg | INTRAMUSCULAR | Status: DC | PRN
  Administered 2018-01-17: 0.5 mg via INTRAVENOUS
  Filled 2018-01-17: qty 1

## 2018-01-17 MED ORDER — HYDROMORPHONE HCL 1 MG/ML IJ SOLN
1.0000 mg | INTRAMUSCULAR | Status: DC | PRN
  Administered 2018-01-18 – 2018-01-19 (×2): 1 mg via INTRAVENOUS
  Filled 2018-01-17 (×2): qty 1

## 2018-01-17 MED ORDER — FENTANYL CITRATE (PF) 100 MCG/2ML IJ SOLN
INTRAMUSCULAR | Status: DC | PRN
  Administered 2018-01-17: 150 ug via INTRAVENOUS
  Administered 2018-01-17: 100 ug via INTRAVENOUS

## 2018-01-17 MED ORDER — FENTANYL CITRATE (PF) 250 MCG/5ML IJ SOLN
INTRAMUSCULAR | Status: AC
  Filled 2018-01-17: qty 5

## 2018-01-17 MED ORDER — OXYCODONE HCL 5 MG PO TABS: 5.0000 mg | ORAL_TABLET | Freq: Once | ORAL | Status: DC | PRN

## 2018-01-17 SURGICAL SUPPLY — 44 items
BLADE CLIPPER SURG (BLADE) IMPLANT
CANISTER SUCT 3000ML PPV (MISCELLANEOUS) ×2 IMPLANT
CANISTER WOUNDNEG PRESSURE 500 (CANNISTER) ×2 IMPLANT
CATH ROBINSON RED A/P 16FR (CATHETERS) ×2 IMPLANT
CHLORAPREP W/TINT 26ML (MISCELLANEOUS) ×2 IMPLANT
COVER SURGICAL LIGHT HANDLE (MISCELLANEOUS) ×2 IMPLANT
DRAPE LAPAROSCOPIC ABDOMINAL (DRAPES) ×2 IMPLANT
DRAPE WARM FLUID 44X44 (DRAPE) ×2 IMPLANT
DRSG OPSITE POSTOP 4X10 (GAUZE/BANDAGES/DRESSINGS) IMPLANT
DRSG OPSITE POSTOP 4X8 (GAUZE/BANDAGES/DRESSINGS) IMPLANT
DRSG VAC ATS LRG SENSATRAC (GAUZE/BANDAGES/DRESSINGS) ×2 IMPLANT
ELECT BLADE 6.5 EXT (BLADE) IMPLANT
ELECT CAUTERY BLADE 6.4 (BLADE) ×2 IMPLANT
ELECT REM PT RETURN 9FT ADLT (ELECTROSURGICAL) ×2
ELECTRODE REM PT RTRN 9FT ADLT (ELECTROSURGICAL) ×1 IMPLANT
GAUZE SPONGE 4X4 12PLY STRL (GAUZE/BANDAGES/DRESSINGS) ×2 IMPLANT
GLOVE BIO SURGEON STRL SZ7 (GLOVE) ×2 IMPLANT
GLOVE BIOGEL PI IND STRL 7.5 (GLOVE) ×1 IMPLANT
GLOVE BIOGEL PI INDICATOR 7.5 (GLOVE) ×1
GOWN STRL REUS W/ TWL LRG LVL3 (GOWN DISPOSABLE) ×2 IMPLANT
GOWN STRL REUS W/TWL LRG LVL3 (GOWN DISPOSABLE) ×2
KIT BASIN OR (CUSTOM PROCEDURE TRAY) ×2 IMPLANT
KIT TURNOVER KIT B (KITS) ×2 IMPLANT
LIGASURE IMPACT 36 18CM CVD LR (INSTRUMENTS) IMPLANT
NS IRRIG 1000ML POUR BTL (IV SOLUTION) ×4 IMPLANT
PACK GENERAL/GYN (CUSTOM PROCEDURE TRAY) ×2 IMPLANT
PAD ARMBOARD 7.5X6 YLW CONV (MISCELLANEOUS) ×2 IMPLANT
SPECIMEN JAR LARGE (MISCELLANEOUS) IMPLANT
SPONGE LAP 18X18 X RAY DECT (DISPOSABLE) IMPLANT
STAPLER VISISTAT 35W (STAPLE) ×2 IMPLANT
SUCTION POOLE TIP (SUCTIONS) ×2 IMPLANT
SUT ETHILON 2 LR (SUTURE) ×8 IMPLANT
SUT NOVA 1 T20/GS 25DT (SUTURE) ×4 IMPLANT
SUT PDS AB 1 TP1 96 (SUTURE) IMPLANT
SUT SILK 2 0 SH CR/8 (SUTURE) ×2 IMPLANT
SUT SILK 2 0 TIES 10X30 (SUTURE) ×2 IMPLANT
SUT SILK 3 0 SH CR/8 (SUTURE) ×2 IMPLANT
SUT SILK 3 0 TIES 10X30 (SUTURE) ×2 IMPLANT
SUT VIC AB 3-0 SH 18 (SUTURE) IMPLANT
TAPE CLOTH SURG 4X10 WHT LF (GAUZE/BANDAGES/DRESSINGS) ×2 IMPLANT
TOWEL OR 17X26 10 PK STRL BLUE (TOWEL DISPOSABLE) ×2 IMPLANT
TRAY FOLEY MTR SLVR 16FR STAT (SET/KITS/TRAYS/PACK) IMPLANT
TRAY FOLEY W/METER SILVER 14FR (SET/KITS/TRAYS/PACK) ×2 IMPLANT
YANKAUER SUCT BULB TIP NO VENT (SUCTIONS) IMPLANT

## 2018-01-17 NOTE — Progress Notes (Signed)
Transported pt from PACU to 4E 11 without complications.

## 2018-01-17 NOTE — Op Note (Signed)
Preop diagnosis: Abdominal fascial dehiscence with evisceration Postop diagnosis: Same Procedure performed: Exploratory laparotomy, abdominal fascial closure with retention sutures Surgeon:Manfred Laspina K Jamira Barfuss Anesthesia: General endotracheal Indications: This is a 67 year old male who is one-week status post emergent exploratory laparotomy for large retroperitoneal abscess secondary to perforated appendicitis.  He had a right hemicolectomy.  Patient had been slowly improving.  Earlier today he developed a lot of coughing secondary to bilateral pleural effusions.  This afternoon he felt a popping sensation and had a sudden gush of fluid in his abdomen.  I was called emergently to see the patient.  He had obvious bowel that had eviscerated.  We proceeded directly to the operating room for exploration.  Description of procedure: The patient is brought to the operating room and placed in the supine position on operating room table.  After an adequate level of general anesthesia was obtained, the dressing was removed and his entire abdomen was prepped with Betadine.  We draped in sterile fashion.  A timeout was taken to ensure the proper patient and proper procedure.  We explored the abdomen.  The patient has a few flimsy adhesions that were taken down bluntly.  We identified the ileocolic anastomosis which appeared to be intact with no sign of leak.  We mobilized the small bowel out of the right side of the abdomen.  We identified the drain that had been placed at the time of surgery.  The patient still has a fairly large fluid collection of cloudy appearing fluid in the right lower quadrant in the retroperitoneum.  We suctioned all of this out.  We examine the small bowel from the anastomosis retrograde to the ligament of Treitz.  There is no sign of injury.  The remaining colon appears normal.  The visible part of the liver appears normal.  We then thoroughly irrigated the abdomen with several liters of warm  saline.  I repositioned the drain in the right side so was lying within the entire abscess cavity.  No further purulence was noted.  We then closed the abdomen with multiple interrupted figure-of-eight #1 Novafil sutures.  We also placed for full-thickness #2 Ethilon retention sutures.  We tied these over red rubber bridges.  Once we had completed the fascial closure I cut a few small pieces of VAC sponge and placed these between the retention sutures.  This was sealed with the drape.  This was placed to suction with a good seal.  The patient was then extubated and brought to the recovery room in stable condition.  All sponge, instrument, needle counts are correct.  Imogene Burn. Georgette Dover, MD, [facility]  General/ Trauma Surgery  01/17/2018 10:36 PM

## 2018-01-17 NOTE — Progress Notes (Signed)
Patient ID: Alexander Pollard, male   DOB: 1951/06/10, 67 y.o.   MRN: [id number]    On call - called to see patient because of possible dehiscence.  He was coughing and felt a pop, then a big "gush" of fluid.  I came immediately and examined the patient.  He has eviscerated, but no sign of succus or stool.  Will proceed directly to OR for exploration and closure of abdomen with retention sutures.  Imogene Burn. Georgette Dover, MD, [facility]  General/ Trauma Surgery  01/17/2018 6:09 PM

## 2018-01-17 NOTE — Anesthesia Preprocedure Evaluation (Signed)
Anesthesia Evaluation  Patient identified by MRN, date of birth, ID band Patient awake    Reviewed: Allergy & Precautions, NPO status , Patient's Chart, lab work & pertinent test results  History of Anesthesia Complications Negative for: history of anesthetic complications  Airway Mallampati: II  TM Distance: >3 FB Neck ROM: Full    Dental  (+) Dental Advisory Given   Pulmonary former smoker,    breath sounds clear to auscultation       Cardiovascular + Peripheral Vascular Disease   Rhythm:Regular     Neuro/Psych negative neurological ROS  negative psych ROS   GI/Hepatic Neg liver ROS, evisceration s/p xlap   Endo/Other  negative endocrine ROS  Renal/GU Renal InsufficiencyRenal disease     Musculoskeletal   Abdominal   Peds  Hematology  (+) anemia ,   Anesthesia Other Findings   Reproductive/Obstetrics                             Anesthesia Physical Anesthesia Plan  ASA: II and emergent  Anesthesia Plan: General   Post-op Pain Management:    Induction: Intravenous, Rapid sequence and Cricoid pressure planned  PONV Risk Score and Plan: 2 and Ondansetron and Dexamethasone  Airway Management Planned: Oral ETT  Additional Equipment: None  Intra-op Plan:   Post-operative Plan: Extubation in OR and Possible Post-op intubation/ventilation  Informed Consent: I have reviewed the patients History and Physical, chart, labs and discussed the procedure including the risks, benefits and alternatives for the proposed anesthesia with the patient or authorized representative who has indicated his/her understanding and acceptance.   Dental advisory given  Plan Discussed with: CRNA and Surgeon  Anesthesia Plan Comments:         Anesthesia Quick Evaluation

## 2018-01-17 NOTE — Progress Notes (Signed)
Notified Anesthesia.  Albuterol Tx complete pt still extremely wheezy.  MD at bedside.  Wants to Xray and Lasix 20mg  ordered.  Will give STAT

## 2018-01-17 NOTE — Progress Notes (Signed)
Patient requested pain medication for abdominal pain. Patient stated that he had been coughing and asked if  I could assess his abdominal incision. Upon assessment his dressing appeared to have drainage and at the incision site you could see abdominal tissue bulging out. MD notified with request for further assessment. Patient family at bedside notified. Will continue to monitor.

## 2018-01-17 NOTE — Transfer of Care (Signed)
Immediate Anesthesia Transfer of Care Note  Patient: Alexander Pollard  Procedure(s) Performed: EXPLORATORY LAPAROTOMY (N/A Abdomen)  Patient Location: PACU  Anesthesia Type:General  Level of Consciousness: awake and alert   Airway & Oxygen Therapy: Patient Spontanous Breathing and Patient connected to nasal cannula oxygen  Post-op Assessment: Report given to RN and Post -op Vital signs reviewed and stable  Post vital signs: Reviewed and stable  Last Vitals:  Vitals Value Taken Time  BP 155/74 01/17/2018  8:10 PM  Temp 36.7 C 01/17/2018  8:10 PM  Pulse 119 01/17/2018  8:14 PM  Resp 27 01/17/2018  8:14 PM  SpO2 90 % 01/17/2018  8:14 PM  Vitals shown include unvalidated device data.  Last Pain:  Vitals:   01/17/18 1742  TempSrc:   PainSc: 7       Patients Stated Pain Goal: 3 (05/69/79 4801)  Complications: No apparent anesthesia complications

## 2018-01-17 NOTE — Anesthesia Procedure Notes (Signed)
Procedure Name: Intubation Date/Time: 01/17/2018 6:47 PM Performed by: Eligha Bridegroom, CRNA Pre-anesthesia Checklist: Patient identified, Emergency Drugs available, Suction available, Patient being monitored and Timeout performed Patient Re-evaluated:Patient Re-evaluated prior to induction Oxygen Delivery Method: Circle system utilized Preoxygenation: Pre-oxygenation with 100% oxygen Induction Type: IV induction, Cricoid Pressure applied and Rapid sequence Ventilation: Mask ventilation without difficulty Laryngoscope Size: Mac and 3 Grade View: Grade I Tube type: Oral Tube size: 7.5 mm Number of attempts: 1 Airway Equipment and Method: Stylet Placement Confirmation: ETT inserted through vocal cords under direct vision,  positive ETCO2 and breath sounds checked- equal and bilateral Secured at: 21 cm Tube secured with: Tape Dental Injury: Teeth and Oropharynx as per pre-operative assessment

## 2018-01-17 NOTE — Progress Notes (Signed)
[facility] Progress Note  8 Days Post-Op  Subjective: CC-  S/p thoracentesis yesterday. States that his cough is improved. Pulling [phone] on IS. Denies SOB. Abdomen feels about the same. Pain well controlled. Tolerating diet but states that he gets full quickly. Denies n/v. He had a BM yesterday.  JP drain with 320cc purulent fluid output over the last 24 hours.  Objective: Vital signs in last 24 hours: Temp:  [98.4 F (36.9 C)-98.5 F (36.9 C)] 98.5 F (36.9 C) (05/07 0625) Pulse Rate:  [85-98] 85 (05/07 0625) Resp:  [18] 18 (05/06 2158) BP: (121-154)/(88-99) 154/90 (05/07 0625) SpO2:  [95 %-97 %] 96 % (05/07 0625) Last BM Date: 01/15/18  Intake/Output from previous day: 05/06 0701 - 05/07 0700 In: 544 [P.O.:444; IV Piggyback:100] Out: 320 [Drains:320] Intake/Output this shift: No intake/output data recorded.  PE: Gen:  Alert, NAD, pleasant HEENT: EOM's intact, pupils equal and round Card:  RRR, no M/G/R heard Pulm:  Diminished breath sounds bilateral bases, no W/R/R, effort normal, pulling [phone] on IS Abd: Soft, NT/ND, +BS, open midline incision cdi at proximal aspect and some slough at distal 2/3 without purulent drainage or surrounding erythema/some visible sutures, drain with purulent fluid in bulb Ext:  Calves soft and nontender Psych: A&Ox3  Skin: no rashes noted, warm and dry  Lab Results:  Recent Labs    01/16/18 0007 01/17/18 0404  WBC 15.3* 13.3*  HGB 11.1* 10.9*  HCT 32.5* 32.5*  PLT 242 228   BMET Recent Labs    01/15/18 0944 01/17/18 0404  NA 135 135  K 3.8 3.4*  CL 101 101  CO2 25 27  GLUCOSE 134* 104*  BUN 9 10  CREATININE 0.71 0.76  CALCIUM 8.0* 7.6*   PT/INR No results for input(s): LABPROT, INR in the last 72 hours. CMP     Component Value Date/Time   NA 135 01/17/2018 0404   NA 141 09/10/2015 1117   K 3.4 (L) 01/17/2018 0404   CL 101 01/17/2018 0404   CO2 27 01/17/2018 0404   GLUCOSE 104 (H) 01/17/2018  0404   BUN 10 01/17/2018 0404   BUN 14 09/10/2015 1117   CREATININE 0.76 01/17/2018 0404   CREATININE 0.90 03/19/2015 0925   CALCIUM 7.6 (L) 01/17/2018 0404   PROT 7.6 01/09/2018 2008   PROT 6.5 09/10/2015 1117   ALBUMIN 3.0 (L) 01/09/2018 2008   ALBUMIN 4.5 09/10/2015 1117   AST 41 01/09/2018 2008   ALT 41 01/09/2018 2008   ALKPHOS 95 01/09/2018 2008   BILITOT 2.7 (H) 01/09/2018 2008   BILITOT 1.3 (H) 09/10/2015 1117   GFRNONAA >60 01/17/2018 0404   GFRAA >60 01/17/2018 0404   Lipase     Component Value Date/Time   LIPASE 35 01/09/2018 2008       Studies/Results: Dg Chest 1 View  Result Date: 01/16/2018 CLINICAL DATA:  Status post right-sided thoracentesis EXAM: CHEST  1 VIEW COMPARISON:  Chest x-ray of January 10, 2018 FINDINGS: There is no postprocedure pneumothorax. A moderate-sized right-sided pleural effusion remains. There is no mediastinal shift. The left lung is clear. The heart and pulmonary vascularity are normal. There is calcification in the wall of the aortic arch. IMPRESSION: No postprocedure complication following right-sided thoracentesis. There is a moderate amount of pleural fluid that remains. Electronically Signed   By: David  Martinique M.D.   On: 01/16/2018 13:50   Ct Abdomen Pelvis W Contrast  Result Date: 01/16/2018 CLINICAL DATA:  Right colonic perforation. Status post  right colectomy and drainage of retroperitoneal abscess. EXAM: CT ABDOMEN AND PELVIS WITH CONTRAST TECHNIQUE: Multidetector CT imaging of the abdomen and pelvis was performed using the standard protocol following bolus administration of intravenous contrast. CONTRAST:  19mL OMNIPAQUE IOHEXOL 300 MG/ML  SOLN COMPARISON:  None. FINDINGS: Lower chest: Interval increase in bilateral pleural effusions, moderate to large on the right with compressive atelectasis and small on the left. Heart size is normal without pericardial effusion. Hepatobiliary: Tiny 3 and 4 mm hypodensities in the left hepatic lobe,  statistically consistent with cysts or hemangiomata but are too small to further characterize. There may be a small hemangioma in the right hepatic lobe with more peripheral irregular margins measuring 8 mm. The gallbladder is nondistended and free of stones. Pancreas: Mild fatty atrophy of the pancreas. Spleen: Stable splenomegaly the spleen measuring 15.6 cm AP. Adrenals/Urinary Tract: Normal bilateral adrenal glands, kidneys and urinary bladder without obstructive uropathy. Stomach/Bowel: Status post right colectomy. Percutaneous right lower quadrant drain is identified with slightly more confluent right pelvic fluid collection measuring 3.9 x 4.1 x 5.1 cm in the upper right pelvis just caudad to the tip of the drainage catheter. Redemonstration of right-sided retroperitoneal air with a few areas of intraperitoneal gas likely related to intervention and percutaneous drainage catheter noted as well. No bowel obstruction is seen. No contrast extravasation is identified. Contrast is seen small-bowel loops presently. Intact anastomotic sutures noted. No bowel obstruction is seen. Vascular/Lymphatic: Aortic atherosclerosis without aneurysm. No adenopathy. Reproductive: Normal size prostate and seminal vesicles. Other: Small amount of free fluid in pelvis. Musculoskeletal: No acute or significant osseous findings. IMPRESSION: 1. Development of moderate to large right pleural effusion with compressive atelectasis with development of small left pleural effusion as well. 2. Status post right colectomy with intact surgical anastomosis. No bowel obstruction. 3. Small amount of free fluid in the deep pelvis and right upper pelvis without enhancement to suggest abscess. The right pelvic fluid collection is just caudad to the tip of the pre-existing drainage catheter. 4. Stable splenomegaly. Electronically Signed   By: Ashley Royalty M.D.   On: 01/16/2018 01:39   Ir Thoracentesis Asp Pleural Space W/img Guide  Result Date:  01/16/2018 INDICATION: Patient with shortness of breath, right pleural effusion. Request is made for diagnostic and therapeutic right thoracentesis. EXAM: ULTRASOUND GUIDED DIAGNOSTIC AND THERAPEUTIC RIGHT THORACENTESIS MEDICATIONS: 10 mL 2% lidocaine COMPLICATIONS: None immediate. PROCEDURE: An ultrasound guided thoracentesis was thoroughly discussed with the patient and questions answered. The benefits, risks, alternatives and complications were also discussed. The patient understands and wishes to proceed with the procedure. Written consent was obtained. Ultrasound was performed to localize and mark an adequate pocket of fluid in the right chest. The area was then prepped and draped in the normal sterile fashion. 2% lidocaine was used for local anesthesia. Under ultrasound guidance a Safe-T-Centesis catheter was introduced. Thoracentesis was performed. The catheter was removed and a dressing applied. FINDINGS: A total of approximately 1.0 liters of yellow fluid was removed. Samples were sent to the laboratory as requested by the clinical team. IMPRESSION: Successful ultrasound guided diagnostic and therapeutic right thoracentesis yielding 1.0 liters of pleural fluid. Read by: Brynda Greathouse PA-C Electronically Signed   By: Sandi Mariscal M.D.   On: 01/16/2018 14:26    Anti-infectives: Anti-infectives (From admission, onward)   Start     Dose/Rate Route Frequency Ordered Stop   01/10/18 1130  piperacillin-tazobactam (ZOSYN) IVPB 3.375 g     3.375 g 12.5 mL/hr over 240  Minutes Intravenous Every 8 hours 01/10/18 1123     01/10/18 0400  piperacillin-tazobactam (ZOSYN) IVPB 3.375 g  Status:  Discontinued     3.375 g 12.5 mL/hr over 240 Minutes Intravenous Every 8 hours 01/09/18 2108 01/10/18 0401   01/09/18 2245  piperacillin-tazobactam (ZOSYN) IVPB 3.375 g  Status:  Discontinued     3.375 g 100 mL/hr over 30 Minutes Intravenous  Once 01/09/18 2243 01/09/18 2255   01/09/18 2100  piperacillin-tazobactam  (ZOSYN) IVPB 3.375 g     3.375 g 100 mL/hr over 30 Minutes Intravenous  Once 01/09/18 2056 01/09/18 2219       Assessment/Plan Dilated aortic root Hemochromatosis HTN  Right pleural effusion - s/p thoracentesis 5/6 with 1L yellow fluid output; culture pending  R colonic perforation  - s/p R colectomy and drainage of retroperitoneal abscess, Dr. Donne Hazel, 04/30 - POD 7 - path: appendicitis - CT 5/5 Small amount of free fluid in the deep pelvis and right upper pelvis without enhancement to suggest abscess; moderate to large right pleural effusion with compressive atelectasis with development of small left pleural effusion as well. - WBC trending down 13.3 from 15.7, afebrile - JP drain with persistent high output purulent fluid  FEN:reg diet VTE: SCDs, lovenox ID: IV Zosyn 4/29>>day#9 Foley: dc05/01 Follow up: Dr. Georgette Dover  Plan:Continue ambulating and IS. Replace potassium. Continue IV zosyn and JP drain. Labs in AM   LOS: 7 days    Wellington Hampshire , [facility] 01/17/2018, 8:21 AM Pager: [phone] Consults: [phone] Mon-Fri 7:00 am-4:30 pm Sat-Sun 7:00 am-11:30 am

## 2018-01-18 ENCOUNTER — Inpatient Hospital Stay (HOSPITAL_COMMUNITY): Payer: PPO

## 2018-01-18 ENCOUNTER — Encounter (HOSPITAL_COMMUNITY): Payer: Self-pay | Admitting: Surgery

## 2018-01-18 HISTORY — PX: IR THORACENTESIS ASP PLEURAL SPACE W/IMG GUIDE: IMG5380

## 2018-01-18 LAB — BASIC METABOLIC PANEL
Anion gap: 8 (ref 5–15)
BUN: 9 mg/dL (ref 6–20)
CHLORIDE: 101 mmol/L (ref 101–111)
CO2: 28 mmol/L (ref 22–32)
Calcium: 7.7 mg/dL — ABNORMAL LOW (ref 8.9–10.3)
Creatinine, Ser: 0.92 mg/dL (ref 0.61–1.24)
GFR calc non Af Amer: >60 mL/min (ref >60)
GLUCOSE: 112 mg/dL — AB (ref 65–99)
POTASSIUM: 4 mmol/L (ref 3.5–5.1)
Sodium: 137 mmol/L (ref 135–145)

## 2018-01-18 LAB — CBC
HEMATOCRIT: 36.4 % — AB (ref 39.0–52.0)
HEMOGLOBIN: 12.2 g/dL — AB (ref 13.0–17.0)
MCH: 31 pg (ref 26.0–34.0)
MCHC: 33.5 g/dL (ref 30.0–36.0)
MCV: 92.6 fL (ref 78.0–100.0)
Platelets: 271 10*3/uL (ref 150–400)
RBC: 3.93 MIL/uL — AB (ref 4.22–5.81)
RDW: 13.6 % (ref 11.5–15.5)
WBC: 27.5 10*3/uL — AB (ref 4.0–10.5)

## 2018-01-18 LAB — URINALYSIS, ROUTINE W REFLEX MICROSCOPIC
Bilirubin Urine: NEGATIVE
Glucose, UA: NEGATIVE mg/dL
Ketones, ur: NEGATIVE mg/dL
LEUKOCYTES UA: NEGATIVE
NITRITE: NEGATIVE
PROTEIN: NEGATIVE mg/dL
SPECIFIC GRAVITY, URINE: 1.006 (ref 1.005–1.030)
pH: 5 (ref 5.0–8.0)

## 2018-01-18 LAB — GLUCOSE, CAPILLARY
GLUCOSE-CAPILLARY: 182 mg/dL — AB (ref 65–99)
GLUCOSE-CAPILLARY: 157 mg/dL — AB (ref 65–99)
Glucose-Capillary: 133 mg/dL — ABNORMAL HIGH (ref 65–99)

## 2018-01-18 LAB — MAGNESIUM: Magnesium: 2.1 mg/dL (ref 1.7–2.4)

## 2018-01-18 MED ORDER — LIDOCAINE HCL (PF) 2 % IJ SOLN
INTRAMUSCULAR | Status: AC
  Filled 2018-01-18: qty 20

## 2018-01-18 MED ORDER — FUROSEMIDE 10 MG/ML IJ SOLN
20.0000 mg | Freq: Once | INTRAMUSCULAR | Status: AC
  Administered 2018-01-18: 20 mg via INTRAVENOUS
  Filled 2018-01-18: qty 2

## 2018-01-18 MED ORDER — LIDOCAINE HCL (PF) 2 % IJ SOLN
INTRAMUSCULAR | Status: AC
  Filled 2018-01-18: qty 10

## 2018-01-18 NOTE — Progress Notes (Signed)
Patient ID: NACHUM DEROSSETT, male   DOB: 08-Oct-1950, 67 y.o.   MRN: [id number]    1 Day Post-Op  Subjective: Pt still with some SOB this morning and feels like he is breathing faster than normal.  Ate clear liquids and no complaints, but does feel bloated.  No flatus yet.    Objective: Vital signs in last 24 hours: Temp:  [97.7 F (36.5 C)-99.4 F (37.4 C)] 97.7 F (36.5 C) (05/08 0410) Pulse Rate:  [87-142] 87 (05/08 0410) Resp:  [20-33] 26 (05/08 0410) BP: (110-163)/(74-119) 119/86 (05/08 0410) SpO2:  [90 %-100 %] 98 % (05/08 0410) FiO2 (%):  [40 %] 40 % (05/07 2206) Weight:  [81.8 kg (180 lb 5.4 oz)] 81.8 kg (180 lb 5.4 oz) (05/07 2206) Last BM Date: 01/17/18  Intake/Output from previous day: 05/07 0701 - 05/08 0700 In: 1950 [I.V.:1000; IV Piggyback:250] Out: 1020 [Urine:600; Drains:420] Intake/Output this shift: No intake/output data recorded.  PE: Heart: regular Lungs: CTAB, decrease breath sounds at bases, breathing 35rpm, on 2L O2, sats are in high 90s. Abd: soft, some anasarca noted, JP drain with bloody purulent output.  I drained about 75cc while in the room with him.  370cc of output documented yesterday.  50cc of serosang output from VAC overnight GU: foley with cloudy yellow urine present.  Lab Results:  Recent Labs    01/17/18 0404 01/18/18 0347  WBC 13.3* 27.5*  HGB 10.9* 12.2*  HCT 32.5* 36.4*  PLT 228 271   BMET Recent Labs    01/17/18 0404 01/18/18 0347  NA 135 137  K 3.4* 4.0  CL 101 101  CO2 27 28  GLUCOSE 104* 112*  BUN 10 9  CREATININE 0.76 0.92  CALCIUM 7.6* 7.7*   PT/INR No results for input(s): LABPROT, INR in the last 72 hours. CMP     Component Value Date/Time   NA 137 01/18/2018 0347   NA 141 09/10/2015 1117   K 4.0 01/18/2018 0347   CL 101 01/18/2018 0347   CO2 28 01/18/2018 0347   GLUCOSE 112 (H) 01/18/2018 0347   BUN 9 01/18/2018 0347   BUN 14 09/10/2015 1117   CREATININE 0.92 01/18/2018 0347   CREATININE 0.90  03/19/2015 0925   CALCIUM 7.7 (L) 01/18/2018 0347   PROT 7.6 01/09/2018 2008   PROT 6.5 09/10/2015 1117   ALBUMIN 3.0 (L) 01/09/2018 2008   ALBUMIN 4.5 09/10/2015 1117   AST 41 01/09/2018 2008   ALT 41 01/09/2018 2008   ALKPHOS 95 01/09/2018 2008   BILITOT 2.7 (H) 01/09/2018 2008   BILITOT 1.3 (H) 09/10/2015 1117   GFRNONAA >60 01/18/2018 0347   GFRAA >60 01/18/2018 0347   Lipase     Component Value Date/Time   LIPASE 35 01/09/2018 2008       Studies/Results: Dg Chest 1 View  Result Date: 01/16/2018 CLINICAL DATA:  Status post right-sided thoracentesis EXAM: CHEST  1 VIEW COMPARISON:  Chest x-ray of January 10, 2018 FINDINGS: There is no postprocedure pneumothorax. A moderate-sized right-sided pleural effusion remains. There is no mediastinal shift. The left lung is clear. The heart and pulmonary vascularity are normal. There is calcification in the wall of the aortic arch. IMPRESSION: No postprocedure complication following right-sided thoracentesis. There is a moderate amount of pleural fluid that remains. Electronically Signed   By: David  Martinique M.D.   On: 01/16/2018 13:50   Dg Chest Port 1 View  Result Date: 01/17/2018 CLINICAL DATA:  Shortness of breath. EXAM: PORTABLE CHEST  1 VIEW COMPARISON:  One-view chest x-ray 01/16/2018. FINDINGS: The heart size is normal. Left lung is clear. There is increasing opacification of the right lung. No mediastinal shift is present. The visualized soft tissues and bony thorax are unremarkable. IMPRESSION: 1. Recurrent right-sided pleural effusion. 2. Progressive airspace opacity likely reflecting atelectasis. Infection is not excluded. Electronically Signed   By: San Morelle M.D.   On: 01/17/2018 21:15   Ir Thoracentesis Asp Pleural Space W/img Guide  Result Date: 01/16/2018 INDICATION: Patient with shortness of breath, right pleural effusion. Request is made for diagnostic and therapeutic right thoracentesis. EXAM: ULTRASOUND GUIDED  DIAGNOSTIC AND THERAPEUTIC RIGHT THORACENTESIS MEDICATIONS: 10 mL 2% lidocaine COMPLICATIONS: None immediate. PROCEDURE: An ultrasound guided thoracentesis was thoroughly discussed with the patient and questions answered. The benefits, risks, alternatives and complications were also discussed. The patient understands and wishes to proceed with the procedure. Written consent was obtained. Ultrasound was performed to localize and mark an adequate pocket of fluid in the right chest. The area was then prepped and draped in the normal sterile fashion. 2% lidocaine was used for local anesthesia. Under ultrasound guidance a Safe-T-Centesis catheter was introduced. Thoracentesis was performed. The catheter was removed and a dressing applied. FINDINGS: A total of approximately 1.0 liters of yellow fluid was removed. Samples were sent to the laboratory as requested by the clinical team. IMPRESSION: Successful ultrasound guided diagnostic and therapeutic right thoracentesis yielding 1.0 liters of pleural fluid. Read by: Brynda Greathouse PA-C Electronically Signed   By: Sandi Mariscal M.D.   On: 01/16/2018 14:26    Anti-infectives: Anti-infectives (From admission, onward)   Start     Dose/Rate Route Frequency Ordered Stop   01/10/18 1130  piperacillin-tazobactam (ZOSYN) IVPB 3.375 g     3.375 g 12.5 mL/hr over 240 Minutes Intravenous Every 8 hours 01/10/18 1123     01/10/18 0400  piperacillin-tazobactam (ZOSYN) IVPB 3.375 g  Status:  Discontinued     3.375 g 12.5 mL/hr over 240 Minutes Intravenous Every 8 hours 01/09/18 2108 01/10/18 0401   01/09/18 2245  piperacillin-tazobactam (ZOSYN) IVPB 3.375 g  Status:  Discontinued     3.375 g 100 mL/hr over 30 Minutes Intravenous  Once 01/09/18 2243 01/09/18 2255   01/09/18 2100  piperacillin-tazobactam (ZOSYN) IVPB 3.375 g     3.375 g 100 mL/hr over 30 Minutes Intravenous  Once 01/09/18 2056 01/09/18 2219       Assessment/Plan Dilated aortic  root Hemochromatosis HTN  Right pleural effusion/post op respiratory failure - s/p thoracentesis 5/6 with 1L yellow fluid output; culture negative so far -? Flash pulmonary edema in PACU last night.  Given 20 mg of lasix. -still with tachypnea this am and some SOB on 2L O2.  CXR last night suggested possible redevelopment of effusion, but possible pulm edema.  CXR today.  Given another 20mg  of lasix secondary to body wall anasarca and persistent respiratory issues.  Urinary retention -had some issues voiding so foley left in place.  Urine is very cloudy this morning.  Will check UA.  R colonic perforation  - s/p R colectomy and drainage of retroperitoneal abscess, Dr. Donne Hazel, 04/30 - POD 8 - path:appendicitis - CT 5/5Small amount of free fluid in the deep pelvis and right upper pelvis without enhancement to suggest abscess;moderate to large right pleural effusion with compressive atelectasis with development of small left pleural effusion as well. POD 1, s/p re-exploration for dehiscence with washout -RLQ drain was repositioned better in the abscess cavity.  Still  with bloody purulent output this morning.  Cont to follow -multiple retention sutures were placed and an abdominal wound VAC was placed with black sponge in between each retention bar.  Will ask WOC to see the patient to assist with VAC change. -WBC up to 23K today.  Not unexpected.  Check labs in am.    VLD:KCCQF liquids VTE: SCDs, lovenox ID: IV Zosyn 4/29>>day#10 Foley: in place 5/7 Follow up: Dr. Georgette Dover  Plan:Continue ambulating and IS. Cont in SDU status.  CXR, lasix for further diuresis.  Check UA for cloudy urine.  Await better bowel function. Cont zosyn and drain placement.  WOC consult for [facility] assistance.    LOS: 8 days    Henreitta Cea , [facility] 01/18/2018, 8:50 AM Pager: [phone]

## 2018-01-18 NOTE — Progress Notes (Signed)
PT Cancellation Note  Patient Details Name: Alexander Pollard MRN: [id number] DOB: 05/28/1951   Cancelled Treatment:    Reason Eval/Treat Not Completed: Patient at procedure or test/unavailable PT will continue to follow acutely.    Salina April, PTA Pager: [phone]   01/18/2018, 1:29 PM

## 2018-01-18 NOTE — Procedures (Signed)
   US guided right thoracentesis  450 cc yellow fluid  Tolerated well No labs  cxr pending

## 2018-01-18 NOTE — Progress Notes (Signed)
Patient transferred to bed in room. CHG bath given. Assist with turns. Wound vac noted along with montgomery straps to abdomen. JP drain to RLQ noted draining serosanguinous drainage. Facial grimacing noticed on movement. Bandaid removed from right upper back from thoracentesis that was reported that was done on yesterday. No skin breakdown noted to buttocks or sacrum. Foam reapplied. No open areas noted other than abdominal incision. Foley cath intact. Clear yellow urine noted. 5cc normal saline infused into balloon d/t leakage around catheter.  Resp even and unlabored. Family here to visit with patient. Oriented to room. Call light within reach. HOB up at 62. Patient wants to drink. On clear liquids. Bipap taken off and 02 at 2L applied via nasal cannula. Tolerates well at this time. Will continue to observe.

## 2018-01-18 NOTE — Progress Notes (Signed)
BIPAP at bedside. Currently on [place]. No distress noted.

## 2018-01-18 NOTE — Consult Note (Signed)
WOC requested to change abd Vac dressing tomorrow; pt had surgery yesterday.  I will plan to change Thurs with the surgical PA present to assess the wound during the first post-op dressing change. Julien Girt MSN, RN, [place], [place], [place]

## 2018-01-18 NOTE — Progress Notes (Signed)
Patient returned to unit at approximately 1305 following a Thoracentesis. Per the patient report, the Thoracentesis tapped 500 mL of fluid. VS obtained and WNL. Patient has family at bedside. HOB elevated to approximately 30 degrees. Patient educated on orders for ambulation today. Patient asked this nurse if ambulation could wait until after his visitors left. Patient encouraged to do as much physical activity as possible at this time to regain strength and increase lung functioning.

## 2018-01-18 NOTE — Progress Notes (Signed)
PT Cancellation Note  Patient Details Name: Alexander Pollard MRN: [id number] DOB: 1951-05-16   Cancelled Treatment:    Reason Eval/Treat Not Completed: Patient declined, no reason specified Patient just back in room from thoracentesis and reported feeling "fine". Pt declined participating in therapy at this time due to having visitors. PT will continue to follow acutely.    Salina April, PTA Pager: [phone]   01/18/2018, 1:39 PM

## 2018-01-18 NOTE — Progress Notes (Signed)
Order received for Urinalysis with reflex microscopic. F/C drainage bag drained and clamped for approximately 30 minutes. Urine drainage noted to be amber in color with moderate sedimentation noted to drainage bag. F/C tubing unclamped and approximately 50 ccs of straw colored urine with scant amount of sedimentation collected via specimen container. Specimen sent to lab per MD orders.

## 2018-01-19 ENCOUNTER — Inpatient Hospital Stay (HOSPITAL_COMMUNITY): Payer: PPO

## 2018-01-19 LAB — BODY FLUID CULTURE: Culture: NO GROWTH

## 2018-01-19 LAB — GLUCOSE, CAPILLARY
Glucose-Capillary: 127 mg/dL — ABNORMAL HIGH (ref 65–99)
Glucose-Capillary: 136 mg/dL — ABNORMAL HIGH (ref 65–99)

## 2018-01-19 LAB — CBC
HCT: 38.9 % — ABNORMAL LOW (ref 39.0–52.0)
Hemoglobin: 13 g/dL (ref 13.0–17.0)
MCH: 31.4 pg (ref 26.0–34.0)
MCHC: 33.4 g/dL (ref 30.0–36.0)
MCV: 94 fL (ref 78.0–100.0)
PLATELETS: 267 10*3/uL (ref 150–400)
RBC: 4.14 MIL/uL — AB (ref 4.22–5.81)
RDW: 13.9 % (ref 11.5–15.5)
WBC: 17.3 10*3/uL — ABNORMAL HIGH (ref 4.0–10.5)

## 2018-01-19 LAB — BASIC METABOLIC PANEL
Anion gap: 11 (ref 5–15)
BUN: 9 mg/dL (ref 6–20)
CO2: 25 mmol/L (ref 22–32)
CREATININE: 0.82 mg/dL (ref 0.61–1.24)
Calcium: 8 mg/dL — ABNORMAL LOW (ref 8.9–10.3)
Chloride: 101 mmol/L (ref 101–111)
GFR calc Af Amer: >60 mL/min (ref >60)
GLUCOSE: 126 mg/dL — AB (ref 65–99)
POTASSIUM: 4.2 mmol/L (ref 3.5–5.1)
SODIUM: 137 mmol/L (ref 135–145)

## 2018-01-19 LAB — URINE CULTURE: CULTURE: NO GROWTH

## 2018-01-19 MED ORDER — FUROSEMIDE 10 MG/ML IJ SOLN
20.0000 mg | Freq: Once | INTRAMUSCULAR | Status: AC
  Administered 2018-01-19: 20 mg via INTRAVENOUS
  Filled 2018-01-19: qty 2

## 2018-01-19 NOTE — Progress Notes (Signed)
Patient ID: Alexander Pollard, male   DOB: 04/16/1951, 67 y.o.   MRN: [id number]    2 Days Post-Op  Subjective: Patient getting VAC changed right now and having a lot of pain with that.  Otherwise no bowel function yet.  He is still on clear liquids and not having nausea, but does still feel slightly bloated.  Ambulated once yesterday and has been in the chair.  Objective: Vital signs in last 24 hours: Temp:  [98.3 F (36.8 C)-98.8 F (37.1 C)] 98.6 F (37 C) (05/09 0814) Pulse Rate:  [94-110] 110 (05/09 0814) Resp:  [24-30] 24 (05/09 0814) BP: (101-133)/(90-99) 125/99 (05/09 0814) SpO2:  [92 %-97 %] 97 % (05/09 0814) Last BM Date: 01/17/18  Intake/Output from previous day: 05/08 0701 - 05/09 0700 In: 55  Out: 1150 [Urine:1150] Intake/Output this shift: Total I/O In: 360 [P.O.:360] Out: 400 [Urine:400]  PE: Gen: distress secondary to VAC change Heart: regular Lungs: CTAB, good respiratory effort Abd: midline wound is clean and retention sutures and rods in place.  Hypoactive to absent BS, appropriately tender, JP drain with cloudy serosang output, less purulent in nature.   Ext: +2 BLE edema  Lab Results:  Recent Labs    01/18/18 0347 01/19/18 0414  WBC 27.5* 17.3*  HGB 12.2* 13.0  HCT 36.4* 38.9*  PLT 271 267   BMET Recent Labs    01/18/18 0347 01/19/18 0414  NA 137 137  K 4.0 4.2  CL 101 101  CO2 28 25  GLUCOSE 112* 126*  BUN 9 9  CREATININE 0.92 0.82  CALCIUM 7.7* 8.0*   PT/INR No results for input(s): LABPROT, INR in the last 72 hours. CMP     Component Value Date/Time   NA 137 01/19/2018 0414   NA 141 09/10/2015 1117   K 4.2 01/19/2018 0414   CL 101 01/19/2018 0414   CO2 25 01/19/2018 0414   GLUCOSE 126 (H) 01/19/2018 0414   BUN 9 01/19/2018 0414   BUN 14 09/10/2015 1117   CREATININE 0.82 01/19/2018 0414   CREATININE 0.90 03/19/2015 0925   CALCIUM 8.0 (L) 01/19/2018 0414   PROT 7.6 01/09/2018 2008   PROT 6.5 09/10/2015 1117   ALBUMIN 3.0  (L) 01/09/2018 2008   ALBUMIN 4.5 09/10/2015 1117   AST 41 01/09/2018 2008   ALT 41 01/09/2018 2008   ALKPHOS 95 01/09/2018 2008   BILITOT 2.7 (H) 01/09/2018 2008   BILITOT 1.3 (H) 09/10/2015 1117   GFRNONAA >60 01/19/2018 0414   GFRAA >60 01/19/2018 0414   Lipase     Component Value Date/Time   LIPASE 35 01/09/2018 2008       Studies/Results: Dg Chest 1 View  Result Date: 01/18/2018 CLINICAL DATA:  Status post right-sided thoracentesis. No chest pain or shortness of breath. EXAM: CHEST  1 VIEW COMPARISON:  Chest x-ray of Jan 18, 2018 at 9:21 a.m. FINDINGS: There is improved aeration of the right lung. No pneumothorax is observed. A moderate amount of pleural fluid remains inferiorly and laterally on the right. There is no mediastinal shift. The left lung is clear. The cardiac silhouette remains enlarged. The pulmonary vascularity is mildly prominent. IMPRESSION: Improved aeration of the right lung following right-sided thoracentesis. No postprocedure pneumothorax. Electronically Signed   By: David  Martinique M.D.   On: 01/18/2018 14:26   Dg Chest Port 1 View  Result Date: 01/18/2018 CLINICAL DATA:  67 year old male with shortness breath and pulmonary edema. Postop appendectomy. Subsequent encounter. EXAM: PORTABLE CHEST 1  VIEW COMPARISON:  01/17/2018 chest x-ray. FINDINGS: Moderately large right-sided pleural effusion similar to prior exam. Limited evaluation of right lung given the presence of pleural effusion. Left lung clear. Cardiomegaly. Calcified aorta. IMPRESSION: Similar appearance of moderately large right-sided pleural effusion. Limited evaluation of the right lung secondary to the pleural effusion. Cardiomegaly. Aortic Atherosclerosis (ICD10-I70.0). Electronically Signed   By: Genia Del M.D.   On: 01/18/2018 10:56   Dg Chest Port 1 View  Result Date: 01/17/2018 CLINICAL DATA:  Shortness of breath. EXAM: PORTABLE CHEST 1 VIEW COMPARISON:  One-view chest x-ray 01/16/2018.  FINDINGS: The heart size is normal. Left lung is clear. There is increasing opacification of the right lung. No mediastinal shift is present. The visualized soft tissues and bony thorax are unremarkable. IMPRESSION: 1. Recurrent right-sided pleural effusion. 2. Progressive airspace opacity likely reflecting atelectasis. Infection is not excluded. Electronically Signed   By: San Morelle M.D.   On: 01/17/2018 21:15   Ir Thoracentesis Asp Pleural Space W/img Guide  Result Date: 01/18/2018 INDICATION: Symptomatic right sided pleural effusion EXAM: IR THORACENTESIS ASP PLEURAL SPACE W/IMG GUIDE COMPARISON:  Previous thoracentesis. MEDICATIONS: 15 cc 2% lidocaine. COMPLICATIONS: None immediate. TECHNIQUE: Informed written consent was obtained from the patient after a discussion of the risks, benefits and alternatives to treatment. A timeout was performed prior to the initiation of the procedure. Initial ultrasound scanning demonstrates a right pleural effusion. The lower chest was prepped and draped in the usual sterile fashion. 1% lidocaine was used for local anesthesia. Under direct ultrasound guidance, a 19 gauge, 7-cm, Yueh catheter was introduced. An ultrasound image was saved for documentation purposes. Very little fluid was removed using Yueh. Asked Dr Barbie Banner to evaluate site and access. An 8 Fr Safe-T-Centesis catheter was introduced per Dr Barbie Banner. The thoracentesis was performed. The catheter was removed and a dressing was applied. The patient tolerated the procedure well without immediate post procedural complication. The patient was escorted to have an upright chest radiograph. FINDINGS: A total of approximately 500 cc of yellow fluid was removed. IMPRESSION: Successful ultrasound-guided right sided thoracentesis yielding 500 cc of pleural fluid. Read by Lavonia Drafts [facility] Electronically Signed   By: Marybelle Killings M.D.   On: 01/18/2018 13:49    Anti-infectives: Anti-infectives (From admission,  onward)   Start     Dose/Rate Route Frequency Ordered Stop   01/10/18 1130  piperacillin-tazobactam (ZOSYN) IVPB 3.375 g     3.375 g 12.5 mL/hr over 240 Minutes Intravenous Every 8 hours 01/10/18 1123     01/10/18 0400  piperacillin-tazobactam (ZOSYN) IVPB 3.375 g  Status:  Discontinued     3.375 g 12.5 mL/hr over 240 Minutes Intravenous Every 8 hours 01/09/18 2108 01/10/18 0401   01/09/18 2245  piperacillin-tazobactam (ZOSYN) IVPB 3.375 g  Status:  Discontinued     3.375 g 100 mL/hr over 30 Minutes Intravenous  Once 01/09/18 2243 01/09/18 2255   01/09/18 2100  piperacillin-tazobactam (ZOSYN) IVPB 3.375 g     3.375 g 100 mL/hr over 30 Minutes Intravenous  Once 01/09/18 2056 01/09/18 2219       Assessment/Plan Dilated aortic root Hemochromatosis HTN  Right pleural effusion/post op respiratory failure -s/pthoracentesis 5/6 with 1L yellow fluid output; culture negative  -? Flash pulmonary edema in PACU last night.  Given 20 mg of lasix. -respiratory status improved today and film looks better.  Still with significant BLE edema.  Will give one more dose of lasix today to help remove some of this.  Urinary  retention -foley DC yesterday.  Voiding on his own.  -UA relatively unremarkable.  Urine cx pending  R colonic perforation  - s/p R colectomy and drainage of retroperitoneal abscess, Dr. Donne Hazel, 04/30 - POD9 - path:appendicitis POD 2, s/p re-exploration for dehiscence with washout -RLQ drain was repositioned better in the abscess cavity. Less purulent this am -wound VAC changed by WOC this am.  Wound is intact and clean. -WBC down to 17K today.   -post op ileus, tolerating clears, but no bowel function yet -cont mobilization and IS use.  Pulling 1000  SEG:BTDVV liquids VTE: SCDs, lovenox ID: IV Zosyn 4/29>>day#11 Foley: DC 5/8 Follow up: Dr. Donne Hazel  Plan:Continue ambulating and IS. Cont in SDU status, likely tx to the floor tomorrow.  Await better  bowel function. Cont zosyn and drain placement.  WOC consult for [place] assistance, will plan to change VAC on Monday, but if discharges home by some chance before then, will likely not need VAC at discharge due to size of the wound.    LOS: 9 days    Henreitta Cea , [facility] 01/19/2018, 9:23 AM Pager: [phone]

## 2018-01-19 NOTE — Care Management Important Message (Signed)
Important Message  Patient Details  Name: Alexander Pollard MRN: [id number] Date of Birth: 06-Nov-1950   Medicare Important Message Given:  Yes    Alexander Pollard [name] 01/19/2018, 1:35 PM

## 2018-01-19 NOTE — Progress Notes (Signed)
Physical Therapy Treatment Patient Details Name: Alexander Pollard MRN: [id number] DOB: [birth year] Today's Date: 01/19/2018    History of Present Illness Pt is a 67 y/o male who presents with R sided abdominal pain. He was found to have a retroperitoneal abscess and is now s/p R colectomy and drainage of abscess. PMH significant for enlarged aorta, dilated aortic root.     PT Comments    Pt making steady progress.   Follow Up Recommendations  Home health PT;Supervision - Intermittent     Equipment Recommendations  Other (comment)(cane vs walker)    Recommendations for Other Services       Precautions / Restrictions Precautions Precaution Comments: Wound VAC, JP drain Restrictions Weight Bearing Restrictions: No    Mobility  Bed Mobility   Bed Mobility: Supine to Sit     Supine to sit: Min assist     General bed mobility comments: Assist by pt pulling up on therapist hand to elevate trunk into sitting  Transfers Overall transfer level: Needs assistance Equipment used: None;Straight cane Transfers: Sit to/from Stand Sit to Stand: Supervision         General transfer comment: Supervision for safety  Ambulation/Gait Ambulation/Gait assistance: Min guard Ambulation Distance (Feet): 450 Feet Assistive device: Straight cane Gait Pattern/deviations: Step-through pattern;Decreased stride length Gait velocity: Decreased Gait velocity interpretation: <1.31 ft/sec, indicative of household ambulator General Gait Details: Pt with slightly unsteady gait with cane although no overt loss of balance   Stairs             Wheelchair Mobility    Modified Rankin (Stroke Patients Only)       Balance Overall balance assessment: Mild deficits observed, not formally tested                                          Cognition Arousal/Alertness: Awake/alert Behavior During Therapy: WFL for tasks assessed/performed Overall Cognitive Status: Within  Functional Limits for tasks assessed                                        Exercises      General Comments        Pertinent Vitals/Pain Pain Assessment: Faces Faces Pain Scale: Hurts even more Pain Location: Abdomen especially with transitional movments Pain Descriptors / Indicators: Grimacing;Operative site guarding Pain Intervention(s): Limited activity within patient's tolerance;Monitored during session    Home Living                      Prior Function            PT Goals (current goals can now be found in the care plan section) Progress towards PT goals: Progressing toward goals    Frequency    Min 3X/week      PT Plan Current plan remains appropriate    Co-evaluation              AM-PAC PT "6 Clicks" Daily Activity  Outcome Measure  Difficulty turning over in bed (including adjusting bedclothes, sheets and blankets)?: A Little Difficulty moving from lying on back to sitting on the side of the bed? : Unable Difficulty sitting down on and standing up from a chair with arms (e.g., wheelchair, bedside commode, etc,.)?: A Little Help needed moving to and  from a bed to chair (including a wheelchair)?: A Little Help needed walking in hospital room?: A Little Help needed climbing 3-5 steps with a railing? : A Little 6 Click Score: 16    End of Session Equipment Utilized During Treatment: (Gait belt deferred due to abdominal incision) Activity Tolerance: Patient tolerated treatment well Patient left: in chair;with call bell/phone within reach Nurse Communication: Mobility status PT Visit Diagnosis: Pain;Muscle weakness (generalized) (M62.81);Unsteadiness on feet (R26.81) Pain - part of body: (Abdomen)     Time: 5947-0761 PT Time Calculation (min) (ACUTE ONLY): 28 min  Charges:  $Gait Training: 23-37 mins                    G Codes:       [facility] PT [place] 01/19/2018, 4:49 PM

## 2018-01-19 NOTE — Consult Note (Addendum)
[place] Nurse wound consult note Reason for Consult: Consult requested for abd Vac dressing change.  Claiborne Billings, surgical PA at the bedside to assess wound appearance during the first post-op dressing change. Wound type: Full thickness wound to midline abd with 4 red rubber retention rods in sutured in place between wounds Measurement: 16X1X.8cm Wound bed: beefy red Drainage (amount, consistency, odor) small amt pink drainage, no odor  Periwound: intact skin surrounding Dressing procedure/placement/frequency: Applied one piece of black sponge into 4 open areas, then another strip of foam to connect all sites to 11mm cont suction.  Pt was medicated prior to the procedure and had mod amt pain.  Plan to change the dressing on Mon, as requested by the surgical team. Julien Girt MSN, RN, [place], [place], [place]

## 2018-01-20 ENCOUNTER — Encounter (HOSPITAL_COMMUNITY): Payer: Self-pay | Admitting: Surgery

## 2018-01-20 ENCOUNTER — Inpatient Hospital Stay (HOSPITAL_COMMUNITY): Payer: PPO

## 2018-01-20 LAB — GLUCOSE, CAPILLARY
GLUCOSE-CAPILLARY: 144 mg/dL — AB (ref 65–99)
Glucose-Capillary: 108 mg/dL — ABNORMAL HIGH (ref 65–99)

## 2018-01-20 MED ORDER — METHOCARBAMOL 500 MG PO TABS
500.0000 mg | ORAL_TABLET | Freq: Three times a day (TID) | ORAL | Status: DC
  Administered 2018-01-20 – 2018-01-23 (×11): 500 mg via ORAL
  Filled 2018-01-20 (×11): qty 1

## 2018-01-20 NOTE — Progress Notes (Signed)
Patient ID: Alexander Pollard, male   DOB: 1951/04/30, 67 y.o.   MRN: [id number]    3 Days Post-Op  Subjective: Pt feels better today.  Had a BM and passing flatus.  No nausea on full liquids.  Mobilized well yesterday.  Up in chair today.  No SOB.  Objective: Vital signs in last 24 hours: Temp:  [97.5 F (36.4 C)-98 F (36.7 C)] 98 F (36.7 C) (05/10 0400) Pulse Rate:  [89-111] 92 (05/10 0400) Resp:  [13-26] 24 (05/10 0400) BP: (113-149)/(85-95) 134/94 (05/10 0400) SpO2:  [94 %-96 %] 96 % (05/10 0400) Last BM Date: 01/17/18  Intake/Output from previous day: 05/09 0701 - 05/10 0700 In: 2260 [P.O.:1430; IV Piggyback:830] Out: 8416 [Urine:1450; Drains:195] Intake/Output this shift: Total I/O In: 120 [P.O.:120] Out: -   PE: Heart: regular Lungs: CTAB Abd: soft, appropriately tender, +BS, less distended, midline wound with VAC in place.  Minimal serosang output.  RLQ drain is more thing with slightly cloudy serous output.  Lab Results:  Recent Labs    01/18/18 0347 01/19/18 0414  WBC 27.5* 17.3*  HGB 12.2* 13.0  HCT 36.4* 38.9*  PLT 271 267   BMET Recent Labs    01/18/18 0347 01/19/18 0414  NA 137 137  K 4.0 4.2  CL 101 101  CO2 28 25  GLUCOSE 112* 126*  BUN 9 9  CREATININE 0.92 0.82  CALCIUM 7.7* 8.0*   PT/INR No results for input(s): LABPROT, INR in the last 72 hours. CMP     Component Value Date/Time   NA 137 01/19/2018 0414   NA 141 09/10/2015 1117   K 4.2 01/19/2018 0414   CL 101 01/19/2018 0414   CO2 25 01/19/2018 0414   GLUCOSE 126 (H) 01/19/2018 0414   BUN 9 01/19/2018 0414   BUN 14 09/10/2015 1117   CREATININE 0.82 01/19/2018 0414   CREATININE 0.90 03/19/2015 0925   CALCIUM 8.0 (L) 01/19/2018 0414   PROT 7.6 01/09/2018 2008   PROT 6.5 09/10/2015 1117   ALBUMIN 3.0 (L) 01/09/2018 2008   ALBUMIN 4.5 09/10/2015 1117   AST 41 01/09/2018 2008   ALT 41 01/09/2018 2008   ALKPHOS 95 01/09/2018 2008   BILITOT 2.7 (H) 01/09/2018 2008   BILITOT  1.3 (H) 09/10/2015 1117   GFRNONAA >60 01/19/2018 0414   GFRAA >60 01/19/2018 0414   Lipase     Component Value Date/Time   LIPASE 35 01/09/2018 2008       Studies/Results: Dg Chest 1 View  Result Date: 01/18/2018 CLINICAL DATA:  Status post right-sided thoracentesis. No chest pain or shortness of breath. EXAM: CHEST  1 VIEW COMPARISON:  Chest x-ray of Jan 18, 2018 at 9:21 a.m. FINDINGS: There is improved aeration of the right lung. No pneumothorax is observed. A moderate amount of pleural fluid remains inferiorly and laterally on the right. There is no mediastinal shift. The left lung is clear. The cardiac silhouette remains enlarged. The pulmonary vascularity is mildly prominent. IMPRESSION: Improved aeration of the right lung following right-sided thoracentesis. No postprocedure pneumothorax. Electronically Signed   By: David  Martinique M.D.   On: 01/18/2018 14:26   Dg Chest Port 1 View  Result Date: 01/19/2018 CLINICAL DATA:  Shortness of breath EXAM: PORTABLE CHEST 1 VIEW COMPARISON:  Portable exam 0716 hours compared to 01/18/2018 FINDINGS: Enlargement of cardiac silhouette with minimal pulmonary vascular congestion. Mediastinal contours normal. Persistent moderate RIGHT pleural effusion and basilar atelectasis. Remaining lungs clear. No segmental consolidation or pneumothorax. Bones  unremarkable. IMPRESSION: Persistent RIGHT pleural effusion and basilar atelectasis. Enlargement of cardiac silhouette with minimal pulmonary vascular congestion. Electronically Signed   By: Lavonia Dana M.D.   On: 01/19/2018 10:58   Dg Chest Port 1 View  Result Date: 01/18/2018 CLINICAL DATA:  67 year old male with shortness breath and pulmonary edema. Postop appendectomy. Subsequent encounter. EXAM: PORTABLE CHEST 1 VIEW COMPARISON:  01/17/2018 chest x-ray. FINDINGS: Moderately large right-sided pleural effusion similar to prior exam. Limited evaluation of right lung given the presence of pleural effusion. Left  lung clear. Cardiomegaly. Calcified aorta. IMPRESSION: Similar appearance of moderately large right-sided pleural effusion. Limited evaluation of the right lung secondary to the pleural effusion. Cardiomegaly. Aortic Atherosclerosis (ICD10-I70.0). Electronically Signed   By: Genia Del M.D.   On: 01/18/2018 10:56   Ir Thoracentesis Asp Pleural Space W/img Guide  Result Date: 01/18/2018 INDICATION: Symptomatic right sided pleural effusion EXAM: IR THORACENTESIS ASP PLEURAL SPACE W/IMG GUIDE COMPARISON:  Previous thoracentesis. MEDICATIONS: 15 cc 2% lidocaine. COMPLICATIONS: None immediate. TECHNIQUE: Informed written consent was obtained from the patient after a discussion of the risks, benefits and alternatives to treatment. A timeout was performed prior to the initiation of the procedure. Initial ultrasound scanning demonstrates a right pleural effusion. The lower chest was prepped and draped in the usual sterile fashion. 1% lidocaine was used for local anesthesia. Under direct ultrasound guidance, a 19 gauge, 7-cm, Yueh catheter was introduced. An ultrasound image was saved for documentation purposes. Very little fluid was removed using Yueh. Asked Dr Barbie Banner to evaluate site and access. An 8 Fr Safe-T-Centesis catheter was introduced per Dr Barbie Banner. The thoracentesis was performed. The catheter was removed and a dressing was applied. The patient tolerated the procedure well without immediate post procedural complication. The patient was escorted to have an upright chest radiograph. FINDINGS: A total of approximately 500 cc of yellow fluid was removed. IMPRESSION: Successful ultrasound-guided right sided thoracentesis yielding 500 cc of pleural fluid. Read by Lavonia Drafts [facility] Electronically Signed   By: Marybelle Killings M.D.   On: 01/18/2018 13:49    Anti-infectives: Anti-infectives (From admission, onward)   Start     Dose/Rate Route Frequency Ordered Stop   01/10/18 1130  piperacillin-tazobactam (ZOSYN)  IVPB 3.375 g     3.375 g 12.5 mL/hr over 240 Minutes Intravenous Every 8 hours 01/10/18 1123     01/10/18 0400  piperacillin-tazobactam (ZOSYN) IVPB 3.375 g  Status:  Discontinued     3.375 g 12.5 mL/hr over 240 Minutes Intravenous Every 8 hours 01/09/18 2108 01/10/18 0401   01/09/18 2245  piperacillin-tazobactam (ZOSYN) IVPB 3.375 g  Status:  Discontinued     3.375 g 100 mL/hr over 30 Minutes Intravenous  Once 01/09/18 2243 01/09/18 2255   01/09/18 2100  piperacillin-tazobactam (ZOSYN) IVPB 3.375 g     3.375 g 100 mL/hr over 30 Minutes Intravenous  Once 01/09/18 2056 01/09/18 2219       Assessment/Plan Dilated aortic root Hemochromatosis HTN  Right pleural effusion/post op respiratory failure -s/pthoracentesis 5/6 with 1L yellow fluid output; culturenegative, repeat thora on 5/8 with 450cc removed -? Flash pulmonary edema in PACU. 3 total doses of lasix has been given since second operation. -respiratory status improved  Urinary retention -foley DC yesterday.  Voiding on his own.  -UA relatively unremarkable.  Urine cx negative  R colonic perforation  - s/p R colectomy and drainage of retroperitoneal abscess, Dr. Donne Hazel, 04/30 - POD10 - path:appendicitis POD 3, s/p re-exploration for dehiscence with washout -RLQ  drain was repositioned better in the abscess cavity. Less purulent. -wound VAC changed by WOC.  Plan next change on Monday.  Should not require VAC at home due to such shallow wound. -WBC down to 17K yesterday.  Check CBC in am -post op ileus resolving.  Soft diet today. -cont mobilization and IS use.  Pulling 1000  SAY:TKZS diet VTE: SCDs, lovenox ID: IV Zosyn 4/29>>day#12, likely transition to oral abx therapy on 5/11 Foley:DC 5/8 Follow up: Dr. Donne Hazel  Plan:Continue ambulating and IS.TX to 6N today.  Cont VAC through weekend, change and or remove on Monday pending how the wound looks.  Will need HHPT/OT at discharge.  Equipment  already ordered.     LOS: 10 days    Henreitta Cea , [facility] 01/20/2018, 9:10 AM Pager: [phone]

## 2018-01-20 NOTE — Anesthesia Postprocedure Evaluation (Signed)
Anesthesia Post Note  Patient: Alexander Pollard  Procedure(s) Performed: EXPLORATORY LAPAROTOMY (N/A Abdomen)     Patient location during evaluation: PACU Anesthesia Type: General Level of consciousness: awake and alert Pain management: pain level controlled Vital Signs Assessment: post-procedure vital signs reviewed and stable Respiratory status: spontaneous breathing (Right pleural effusion preop and wheezing with sob post op, lasix for pulm edema with improvement and CPAP to improve oxygenation/ventilation) Cardiovascular status: blood pressure returned to baseline and stable Postop Assessment: no apparent nausea or vomiting Anesthetic complications: no    Last Vitals:  Vitals:   01/20/18 0000 01/20/18 0400  BP: 134/88 (!) 134/94  Pulse: 91 92  Resp: (!) 22 (!) 24  Temp:  36.7 C  SpO2: 95% 96%    Last Pain:  Vitals:   01/20/18 1001  TempSrc:   PainSc: 2                  Johnjoseph Rolfe

## 2018-01-20 NOTE — Progress Notes (Signed)
Pt A/O x 3, report called in to [place], [place], Pt transferred to [address] with his belongings. No sign of distress noted, wound Vac and JP drain in place and intact.

## 2018-01-21 LAB — CBC
HEMATOCRIT: 33.2 % — AB (ref 39.0–52.0)
HEMOGLOBIN: 10.9 g/dL — AB (ref 13.0–17.0)
MCH: 30.4 pg (ref 26.0–34.0)
MCHC: 32.8 g/dL (ref 30.0–36.0)
MCV: 92.7 fL (ref 78.0–100.0)
Platelets: 318 10*3/uL (ref 150–400)
RBC: 3.58 MIL/uL — ABNORMAL LOW (ref 4.22–5.81)
RDW: 13.8 % (ref 11.5–15.5)
WBC: 11.5 10*3/uL — AB (ref 4.0–10.5)

## 2018-01-21 LAB — GLUCOSE, CAPILLARY
GLUCOSE-CAPILLARY: 123 mg/dL — AB (ref 65–99)
GLUCOSE-CAPILLARY: 126 mg/dL — AB (ref 65–99)
Glucose-Capillary: 88 mg/dL (ref 65–99)
Glucose-Capillary: 143 mg/dL — ABNORMAL HIGH (ref 65–99)

## 2018-01-21 LAB — BASIC METABOLIC PANEL
ANION GAP: 7 (ref 5–15)
BUN: 13 mg/dL (ref 6–20)
CALCIUM: 8 mg/dL — AB (ref 8.9–10.3)
CHLORIDE: 104 mmol/L (ref 101–111)
CO2: 27 mmol/L (ref 22–32)
Creatinine, Ser: 0.87 mg/dL (ref 0.61–1.24)
GFR calc non Af Amer: >60 mL/min (ref >60)
Glucose, Bld: 89 mg/dL (ref 65–99)
Potassium: 3.9 mmol/L (ref 3.5–5.1)
Sodium: 138 mmol/L (ref 135–145)

## 2018-01-21 MED ORDER — AMOXICILLIN-POT CLAVULANATE 875-125 MG PO TABS
1.0000 | ORAL_TABLET | Freq: Two times a day (BID) | ORAL | Status: DC
  Administered 2018-01-21 – 2018-01-23 (×5): 1 via ORAL
  Filled 2018-01-21 (×5): qty 1

## 2018-01-21 NOTE — Progress Notes (Signed)
[facility] Progress Note  4 Days Post-Op  Subjective: CC:  No complaints. Pain controlled. Tolerating PO. Urinating and having BMs.   Objective: Vital signs in last 24 hours: Temp:  [98.3 F (36.8 C)-98.5 F (36.9 C)] 98.3 F (36.8 C) (05/11 0535) Pulse Rate:  [69-109] 81 (05/11 0535) Resp:  [19-23] 20 (05/11 0535) BP: (120-148)/(86-99) 120/86 (05/11 0535) SpO2:  [91 %-97 %] 96 % (05/11 0535) Last BM Date: 01/20/18  Intake/Output from previous day: 05/10 0701 - 05/11 0700 In: 220 [P.O.:120; IV Piggyback:100] Out: 305 [Urine:175; Drains:130] Intake/Output this shift: No intake/output data recorded.  PE: Gen:  Alert, NAD, pleasant card: regular Lungs: CTAB Abd: soft, appropriately tender, +BS, mild distention, midline wound with VAC in place.  Minimal serosang output.  RLQ drainw/ cloudy SS output (drains 130cc/24h??)     Lab Results:  Recent Labs    01/19/18 0414 01/21/18 0458  WBC 17.3* 11.5*  HGB 13.0 10.9*  HCT 38.9* 33.2*  PLT 267 318   BMET Recent Labs    01/19/18 0414 01/21/18 0458  NA 137 138  K 4.2 3.9  CL 101 104  CO2 25 27  GLUCOSE 126* 89  BUN 9 13  CREATININE 0.82 0.87  CALCIUM 8.0* 8.0*   PT/INR No results for input(s): LABPROT, INR in the last 72 hours. CMP     Component Value Date/Time   NA 138 01/21/2018 0458   NA 141 09/10/2015 1117   K 3.9 01/21/2018 0458   CL 104 01/21/2018 0458   CO2 27 01/21/2018 0458   GLUCOSE 89 01/21/2018 0458   BUN 13 01/21/2018 0458   BUN 14 09/10/2015 1117   CREATININE 0.87 01/21/2018 0458   CREATININE 0.90 03/19/2015 0925   CALCIUM 8.0 (L) 01/21/2018 0458   PROT 7.6 01/09/2018 2008   PROT 6.5 09/10/2015 1117   ALBUMIN 3.0 (L) 01/09/2018 2008   ALBUMIN 4.5 09/10/2015 1117   AST 41 01/09/2018 2008   ALT 41 01/09/2018 2008   ALKPHOS 95 01/09/2018 2008   BILITOT 2.7 (H) 01/09/2018 2008   BILITOT 1.3 (H) 09/10/2015 1117   GFRNONAA >60 01/21/2018 0458   GFRAA >60 01/21/2018 0458    Lipase     Component Value Date/Time   LIPASE 35 01/09/2018 2008       Studies/Results: Dg Chest Port 1 View  Result Date: 01/20/2018 CLINICAL DATA:  Shortness of breath. EXAM: PORTABLE CHEST 1 VIEW COMPARISON:  Radiograph of Jan 19, 2018. FINDINGS: Stable cardiomegaly. No pneumothorax is noted. Stable large right pleural effusion is noted with associated atelectasis. Left lung is clear. Bony thorax is unremarkable. IMPRESSION: Stable moderate right pleural effusion is noted with adjacent atelectasis. Electronically Signed   By: Marijo Conception, M.D.   On: 01/20/2018 12:03    Anti-infectives: Anti-infectives (From admission, onward)   Start     Dose/Rate Route Frequency Ordered Stop   01/10/18 1130  piperacillin-tazobactam (ZOSYN) IVPB 3.375 g     3.375 g 12.5 mL/hr over 240 Minutes Intravenous Every 8 hours 01/10/18 1123     01/10/18 0400  piperacillin-tazobactam (ZOSYN) IVPB 3.375 g  Status:  Discontinued     3.375 g 12.5 mL/hr over 240 Minutes Intravenous Every 8 hours 01/09/18 2108 01/10/18 0401   01/09/18 2245  piperacillin-tazobactam (ZOSYN) IVPB 3.375 g  Status:  Discontinued     3.375 g 100 mL/hr over 30 Minutes Intravenous  Once 01/09/18 2243 01/09/18 2255   01/09/18 2100  piperacillin-tazobactam (ZOSYN) IVPB 3.375 g  3.375 g 100 mL/hr over 30 Minutes Intravenous  Once 01/09/18 2056 01/09/18 2219       Assessment/Plan Dilated aortic root Hemochromatosis HTN  Right pleural effusion/post op respiratory failure -s/pthoracentesis 5/6 with 1L yellow fluid output; culturenegative, repeat thora on 5/8 with 450cc removed -? Flash pulmonary edema in PACU. 3 total doses of lasix has been given since second operation. -respiratory status improved  Urinary retention -foley DC 5/9. Voiding on his own.  -UA relatively unremarkable. Urine cx negative  R colonic perforation  - s/p R colectomy and drainage of retroperitoneal abscess, Dr. Donne Hazel, 04/30 -  POD11 - path:appendicitis POD4, s/p re-exploration for dehiscence with washout -RLQ drain was repositioned better in the abscess cavity.Less purulent. -wound VAC changed by WOC.  Plan next change on Monday.  Should not require VAC at home due to such shallow wound. -WBCdown to 11.5 from 17k.  Check CBC in am -post op ileus resolving.  Soft diet . -cont mobilization and IS use. Pulling 1000  GBE:EFEO diet VTE: SCDs, lovenox ID: IV Zosyn 4/29-5/11, Augmentin 5/11 >>  Foley:DC 5/8 Follow up: Dr.Wakefield  Plan: start PO abx. Continue VAC and check wound Monday. Anticipate discharge early next week with HH PT/OT/RN.     LOS: 11 days    Jill Alexanders , [facility] 01/21/2018, 10:31 AM Pager: [phone] Consults: [phone] Mon-Fri 7:00 am-4:30 pm Sat-Sun 7:00 am-11:30 am

## 2018-01-22 LAB — CBC
HCT: 32.8 % — ABNORMAL LOW (ref 39.0–52.0)
Hemoglobin: 10.9 g/dL — ABNORMAL LOW (ref 13.0–17.0)
MCH: 30.7 pg (ref 26.0–34.0)
MCHC: 33.2 g/dL (ref 30.0–36.0)
MCV: 92.4 fL (ref 78.0–100.0)
PLATELETS: 320 10*3/uL (ref 150–400)
RBC: 3.55 MIL/uL — AB (ref 4.22–5.81)
RDW: 13.7 % (ref 11.5–15.5)
WBC: 10.2 10*3/uL (ref 4.0–10.5)

## 2018-01-22 LAB — GLUCOSE, CAPILLARY
GLUCOSE-CAPILLARY: 137 mg/dL — AB (ref 65–99)
Glucose-Capillary: 103 mg/dL — ABNORMAL HIGH (ref 65–99)
Glucose-Capillary: 123 mg/dL — ABNORMAL HIGH (ref 65–99)
Glucose-Capillary: 122 mg/dL — ABNORMAL HIGH (ref 65–99)

## 2018-01-22 NOTE — Progress Notes (Signed)
[facility] Progress Note  5 Days Post-Op  Subjective: CC:  Tearful today because is is grateful that he is getting better. Pain controlled. Mobilizing. Tolerating Po and having bowel function. No new complaints. His ex-wife and his son live near by and have been very supportive.  Objective: Vital signs in last 24 hours: Temp:  [98.1 F (36.7 C)-98.6 F (37 C)] 98.1 F (36.7 C) (05/12 0530) Pulse Rate:  [90-95] 90 (05/12 0530) Resp:  [16-17] 17 (05/12 0530) BP: (131-141)/(90-97) 141/95 (05/12 0530) SpO2:  [96 %-97 %] 97 % (05/12 0530) Last BM Date: 01/21/18  Intake/Output from previous day: 05/11 0701 - 05/12 0700 In: 1181.3 [P.O.:912; I.V.:269.3] Out: 24 [Drains:73] Intake/Output this shift: No intake/output data recorded.  PE: Gen:  Alert, NAD, pleasant card: regular Lungs: CTAB Abd: soft, appropriately tender, +BS, mild distention, midline wound with VAC in place. Minimal serosang output. RLQ drainw/ cloudy serous output (<10cc/24h)  Lab Results:  Recent Labs    01/21/18 0458 01/22/18 0315  WBC 11.5* 10.2  HGB 10.9* 10.9*  HCT 33.2* 32.8*  PLT 318 320   BMET Recent Labs    01/21/18 0458  NA 138  K 3.9  CL 104  CO2 27  GLUCOSE 89  BUN 13  CREATININE 0.87  CALCIUM 8.0*   PT/INR No results for input(s): LABPROT, INR in the last 72 hours. CMP     Component Value Date/Time   NA 138 01/21/2018 0458   NA 141 09/10/2015 1117   K 3.9 01/21/2018 0458   CL 104 01/21/2018 0458   CO2 27 01/21/2018 0458   GLUCOSE 89 01/21/2018 0458   BUN 13 01/21/2018 0458   BUN 14 09/10/2015 1117   CREATININE 0.87 01/21/2018 0458   CREATININE 0.90 03/19/2015 0925   CALCIUM 8.0 (L) 01/21/2018 0458   PROT 7.6 01/09/2018 2008   PROT 6.5 09/10/2015 1117   ALBUMIN 3.0 (L) 01/09/2018 2008   ALBUMIN 4.5 09/10/2015 1117   AST 41 01/09/2018 2008   ALT 41 01/09/2018 2008   ALKPHOS 95 01/09/2018 2008   BILITOT 2.7 (H) 01/09/2018 2008   BILITOT 1.3 (H)  09/10/2015 1117   GFRNONAA >60 01/21/2018 0458   GFRAA >60 01/21/2018 0458   Lipase     Component Value Date/Time   LIPASE 35 01/09/2018 2008       Studies/Results: No results found.  Anti-infectives: Anti-infectives (From admission, onward)   Start     Dose/Rate Route Frequency Ordered Stop   01/21/18 1100  amoxicillin-clavulanate (AUGMENTIN) 875-125 MG per tablet 1 tablet     1 tablet Oral Every 12 hours 01/21/18 1034     01/10/18 1130  piperacillin-tazobactam (ZOSYN) IVPB 3.375 g  Status:  Discontinued     3.375 g 12.5 mL/hr over 240 Minutes Intravenous Every 8 hours 01/10/18 1123 01/21/18 1034   01/10/18 0400  piperacillin-tazobactam (ZOSYN) IVPB 3.375 g  Status:  Discontinued     3.375 g 12.5 mL/hr over 240 Minutes Intravenous Every 8 hours 01/09/18 2108 01/10/18 0401   01/09/18 2245  piperacillin-tazobactam (ZOSYN) IVPB 3.375 g  Status:  Discontinued     3.375 g 100 mL/hr over 30 Minutes Intravenous  Once 01/09/18 2243 01/09/18 2255   01/09/18 2100  piperacillin-tazobactam (ZOSYN) IVPB 3.375 g     3.375 g 100 mL/hr over 30 Minutes Intravenous  Once 01/09/18 2056 01/09/18 2219     Assessment/Plan Dilated aortic root Hemochromatosis HTN  Right pleural effusion/post op respiratory failure -s/pthoracentesis 5/6 with 1L  yellow fluid output; culturenegative, repeat thora on 5/8 with 450cc removed -? Flash pulmonary edema in PACU. 3 total doses of lasix has been given since second operation. -respiratory status improved  Urinary retention -foley DC 5/9. Voiding on his own.  -UA relatively unremarkable. Urine cxnegative  R colonic perforation  - s/p R colectomy and drainage of retroperitoneal abscess, Dr. Donne Hazel, 04/30 - POD11 - path:appendicitis POD5, s/p re-exploration for dehiscence with washout  -RLQ drain was repositioned better in the abscess cavity.Less purulent. - cont wound VAC -- transition to wet-to-dry on Monday 5/13 - leukocytosis  resolved  -post op ileusresolving. Cont soft diet . -cont mobilization and IS use. Pulling 1000  STM:HDQQ diet VTE: SCDs, lovenox ID: IV Zosyn 4/29-5/11, Augmentin 5/11 >>  Foley:DC 5/8 Follow up: Dr.Wakefield  Plan: continue PT/OT and PO abx. Wound check tomorrow and possible discharge home w/ wet to dry dressing changes.    LOS: 12 days    Jill Alexanders , [facility] 01/22/2018, 8:29 AM Pager: [phone] Consults: [phone] Mon-Fri 7:00 am-4:30 pm Sat-Sun 7:00 am-11:30 am

## 2018-01-23 LAB — GLUCOSE, CAPILLARY
GLUCOSE-CAPILLARY: 111 mg/dL — AB (ref 65–99)
GLUCOSE-CAPILLARY: 113 mg/dL — AB (ref 65–99)
Glucose-Capillary: 137 mg/dL — ABNORMAL HIGH (ref 65–99)
Glucose-Capillary: 149 mg/dL — ABNORMAL HIGH (ref 65–99)
Glucose-Capillary: 134 mg/dL — ABNORMAL HIGH (ref 65–99)
Glucose-Capillary: 114 mg/dL — ABNORMAL HIGH (ref 65–99)
Glucose-Capillary: 105 mg/dL — ABNORMAL HIGH (ref 65–99)
Glucose-Capillary: 97 mg/dL (ref 65–99)

## 2018-01-23 MED ORDER — AMOXICILLIN-POT CLAVULANATE 875-125 MG PO TABS: 1.0000 | ORAL_TABLET | Freq: Two times a day (BID) | ORAL | 0 refills | Status: DC

## 2018-01-23 MED ORDER — METHOCARBAMOL 500 MG PO TABS: 500.0000 mg | ORAL_TABLET | Freq: Three times a day (TID) | ORAL | 0 refills | Status: DC | PRN

## 2018-01-23 MED ORDER — ACETAMINOPHEN 325 MG PO TABS: 650.0000 mg | ORAL_TABLET | Freq: Four times a day (QID) | ORAL | Status: DC | PRN

## 2018-01-23 MED ORDER — OXYCODONE HCL 5 MG PO TABS: 5.0000 mg | ORAL_TABLET | Freq: Four times a day (QID) | ORAL | 0 refills | Status: DC | PRN

## 2018-01-23 NOTE — Discharge Summary (Signed)
Alexander Pollard   Patient ID: Alexander Pollard MRN: [id number] DOB/AGE: December 03, 1950 67 y.o.  Admit date: 01/09/2018 Discharge date: 01/23/2018  Admitting Diagnosis: Perforated viscus  Discharge Diagnosis Patient Active Problem List   Diagnosis Date Noted  . Acute perforated appendicitis s/p right colectomy 01/10/2018 01/10/2018  . Enlarged aorta ([place])   . Hypertensive response to exercise   . Dyslipidemia 05/28/2014  . Dilated aortic root ([place]) 05/28/2014  . Family history of early CAD 05/10/2014  . Hemochromatosis 03/12/2014    Consultants None  Imaging: No results found.  Procedures Dr. Donne Hazel (01/10/18) -  1.  Right colectomy 2.  Drainage of retroperitoneal abscess 3.  Placement of abdominal wound VAC  Dr. Georgette Dover (01/17/18) -  Procedure performed: Exploratory laparotomy, abdominal fascial closure with retention sutures   Hospital Course:  Alexander Pollard is a 66yo male who presented to [facility] 4/30 with several days of worsening abdominal pain.  Workup showed elevated wbc, cr and lactate with peritonitis on exam. His CT scan shows a perforated viscous.  Patient was admitted and underwent procedure listed above.  Intraoperatively he was found to have Right colonic perforation, possible perforated appendicitis; confirmed to be perforated appendicitis on pathology. Tolerated procedure well and was transferred to the floor.  Patient did have an ileus postoperatively as expected. He had a persistent leukocytosis and purulent drainage from JP drain therefore CT scan was repeated 5/5 and showed no definite intraabdominal abscess. He had developed a right pleural effusion requiring thoracentesis x2. On 5/7 patient developed an abdominal fascial dehiscence with evisceration therefore he returned to the operating room for exploratory laparotomy and abdominal fascial closure with retention sutures. Patient was monitored in the unit for a few days following this  surgery then transferred back to the floor once medically stable. Diet was advanced as tolerated.  Patient worked with therapies during this admission. On 5/13 the patient was voiding well, tolerating diet, ambulating well, pain well controlled, vital signs stable, incisions c/d/i and felt stable for discharge home. He will go home with JP drain and once daily wet to dry dressing changes to abdominal wound. He will be on augmentin BID and follow up next week with Dr. Donne Hazel.  Patient will follow up as below and knows to call with questions or concerns.    I have personally reviewed the patients medication history on the [place] controlled substance database.     Physical Exam: Gen: Alert, NAD, pleasant HEENT: EOM's intact, pupils equal and round Card: RRR, no M/G/R heard Pulm:CTAB, effort normal Abd: Soft, ND, appropriately tender, +BS,open midlineincision cdi with retention sutures intact, drain with trace serosanguinous/slightly cloudy fluid IRS:WNIOEV soft and nontender      Allergies as of 01/23/2018   No Known Allergies     Medication List    TAKE these medications   acetaminophen 325 MG tablet Commonly known as:  TYLENOL Take 2 tablets (650 mg total) by mouth every 6 (six) hours as needed.   amoxicillin-clavulanate 875-125 MG tablet Commonly known as:  AUGMENTIN Take 1 tablet by mouth every 12 (twelve) hours. What changed:    when to take this  additional instructions   aspirin EC 81 MG tablet Take 81 mg by mouth daily.   methocarbamol 500 MG tablet Commonly known as:  ROBAXIN Take 1 tablet (500 mg total) by mouth every 8 (eight) hours as needed for muscle spasms.   metoprolol succinate 25 MG 24 hr tablet Commonly known as:  TOPROL XL Take  1 tablet (25 mg total) by mouth daily.   oxyCODONE 5 MG immediate release tablet Commonly known as:  Oxy IR/ROXICODONE Take 1 tablet (5 mg total) by mouth every 6 (six) hours as needed for severe pain.              Durable Medical Equipment  (From admission, onward)        Start     Ordered   01/13/18 1055  For home use only DME Walker rolling  Once    Question:  Patient needs a walker to treat with the following condition  Answer:  S/P colectomy   01/13/18 1054   01/13/18 1055  For home use only DME 3 n 1  Once     01/13/18 1054       Follow-up Information    Rolm Bookbinder, MD. Call in 1 week(s).   Specialty:  General Surgery Why:  We are working on your appointment, please call to confirm. Please arrive 30 minutes prior to your appointment to check in and fill out paperwork. Bring photo ID and insurance information. Contact information: [address] [place] [address] [phone]           Signed: Wellington Hampshire, [facility] 01/23/2018, 9:20 AM Pager: [phone] Consults: [phone] Mon-Fri 7:00 am-4:30 pm Sat-Sun 7:00 am-11:30 am

## 2018-01-23 NOTE — Consult Note (Signed)
[place] Nurse wound consult follow-up note Reason for Consult: Vac dressing changed with surgical team at the bedside to assess wound appearance Wound type: Full thickness wound to midline abd with 4 retention sutures in place.  Full thickness wounds located in between have greatly decreased in size and depth, beefy red and moist. Drainage (amount, consistency, odor) small amt pink drainage. No odor Dressing procedure/placement/frequency: Pt was medicated for pain prior to the procedure and tolerated with mod amt discomfort. Vac discontinued per surgical team order and moist gauze packing applied.  Surgical team following for assessment and plan of care; please refer to their team for further questions. Please re-consult if further assistance is needed.  Thank-you,  Julien Girt MSN, [place], [place], [place], [place]

## 2018-01-23 NOTE — Progress Notes (Signed)
Physical Therapy Treatment Patient Details Name: Alexander Pollard MRN: [id number] DOB: 08/13/1951 Today's Date: 01/23/2018    History of Present Illness Pt is a 67 y/o male who presents with R sided abdominal pain. He was found to have a retroperitoneal abscess and is now s/p R colectomy and drainage of abscess. PMH significant for enlarged aorta, dilated aortic root.     PT Comments    Pt progressing towards physical therapy goals. Was able to perform transfers and ambulation with gross modified independence to supervision for safety with he RW. Noted grossly flexed posture with rounded shoulders and forward head posture. HEP issued to improve posture and decrease risk of tight/restricted tissue around incisional site. Will continue to follow and progress as able per POC.   Follow Up Recommendations  Home health PT;Supervision - Intermittent     Equipment Recommendations  Other (comment)(cane vs walker)    Recommendations for Other Services       Precautions / Restrictions Precautions Precautions: Fall Restrictions Weight Bearing Restrictions: No    Mobility  Bed Mobility Overal bed mobility: Modified Independent Bed Mobility: Rolling;Sidelying to Sit;Sit to Sidelying Rolling: Modified independent (Device/Increase time) Sidelying to sit: Modified independent (Device/Increase time)     Sit to sidelying: Modified independent (Device/Increase time) General bed mobility comments: Pt was instructed in log roll technique with HOB flat and rails lowered to simulate home environment. Pt was able to transition to/from EOB without assistance and demonstrated good form with VC's for log roll.  Transfers Overall transfer level: Modified independent Equipment used: Rolling walker (2 wheeled) Transfers: Sit to/from Stand Sit to Stand: Modified independent (Device/Increase time)         General transfer comment: Mod I. Pt demonstrated proper hand placement on seated surface for safety.    Ambulation/Gait Ambulation/Gait assistance: Supervision Ambulation Distance (Feet): 400 Feet Assistive device: Rolling walker (2 wheeled) Gait Pattern/deviations: Step-through pattern;Decreased stride length;Trunk flexed Gait velocity: Decreased Gait velocity interpretation: 1.31 - 2.62 ft/sec, indicative of limited community ambulator General Gait Details: Noted significant forward head posture with rounded shoulders and flexed trunk. Pt was able to somewhat correct with cues however unable to maintain.    Stairs Stairs: Yes Stairs assistance: Min guard Stair Management: One rail Right;Step to pattern;Forwards;With walker Number of Stairs: 12 General stair comments: As pt lives alone, he was educated on safe walker management while negotiating stairs. Pt was instructed in R railing use and holding a folded walker in his L hand to advance but also use for some support if needed. Pt was able to complete a flight of stairs without difficulty.    Wheelchair Mobility    Modified Rankin (Stroke Patients Only)       Balance Overall balance assessment: Mild deficits observed, not formally tested                                          Cognition Arousal/Alertness: Awake/alert Behavior During Therapy: WFL for tasks assessed/performed Overall Cognitive Status: Within Functional Limits for tasks assessed                                        Exercises Other Exercises Other Exercises: Pt was instructed in gentle, low range trunk extension to prevent tightening of tissue around incision site and improve standing posture.  Other Exercises: Scapular retraction x10 with towel roll  Other Exercises: Cervical retraction x10 against towel roll in sitting.     General Comments        Pertinent Vitals/Pain Pain Assessment: Faces Faces Pain Scale: Hurts a little bit Pain Location: Abdomen especially with transitional movments Pain Descriptors /  Indicators: Grimacing;Operative site guarding Pain Intervention(s): Monitored during session    Home Living                      Prior Function            PT Goals (current goals can now be found in the care plan section) Acute Rehab PT Goals Patient Stated Goal: Heal his incisional wound PT Goal Formulation: With patient Time For Goal Achievement: 01/18/18 Potential to Achieve Goals: Good Progress towards PT goals: Progressing toward goals    Frequency    Min 3X/week      PT Plan Current plan remains appropriate    Co-evaluation              AM-PAC PT "6 Clicks" Daily Activity  Outcome Measure  Difficulty turning over in bed (including adjusting bedclothes, sheets and blankets)?: None Difficulty moving from lying on back to sitting on the side of the bed? : None Difficulty sitting down on and standing up from a chair with arms (e.g., wheelchair, bedside commode, etc,.)?: None Help needed moving to and from a bed to chair (including a wheelchair)?: None Help needed walking in hospital room?: A Little Help needed climbing 3-5 steps with a railing? : A Little 6 Click Score: 22    End of Session Equipment Utilized During Treatment: Gait belt Activity Tolerance: Patient tolerated treatment well Patient left: in chair;with call bell/phone within reach Nurse Communication: Mobility status PT Visit Diagnosis: Pain;Muscle weakness (generalized) (M62.81);Unsteadiness on feet (R26.81) Pain - part of body: (Abdomen)     Time: 2119-4174 PT Time Calculation (min) (ACUTE ONLY): 28 min  Charges:  $Gait Training: 23-37 mins                    G Codes:       Rolinda Roan, PT, DPT Acute Rehabilitation Services Pager: [phone]    Thelma Comp 01/23/2018, 2:12 PM

## 2018-01-27 DIAGNOSIS — Z87891 Personal history of nicotine dependence: Secondary | ICD-10-CM | POA: Diagnosis not present

## 2018-01-27 DIAGNOSIS — Z4803 Encounter for change or removal of drains: Secondary | ICD-10-CM | POA: Diagnosis not present

## 2018-01-27 DIAGNOSIS — K6819 Other retroperitoneal abscess: Secondary | ICD-10-CM | POA: Diagnosis not present

## 2018-01-27 DIAGNOSIS — I119 Hypertensive heart disease without heart failure: Secondary | ICD-10-CM | POA: Diagnosis not present

## 2018-01-27 DIAGNOSIS — E785 Hyperlipidemia, unspecified: Secondary | ICD-10-CM | POA: Diagnosis not present

## 2018-01-27 DIAGNOSIS — Z7982 Long term (current) use of aspirin: Secondary | ICD-10-CM | POA: Diagnosis not present

## 2018-01-27 DIAGNOSIS — Z9049 Acquired absence of other specified parts of digestive tract: Secondary | ICD-10-CM | POA: Diagnosis not present

## 2018-01-27 DIAGNOSIS — Z48815 Encounter for surgical aftercare following surgery on the digestive system: Secondary | ICD-10-CM | POA: Diagnosis not present

## 2018-01-27 DIAGNOSIS — Z4801 Encounter for change or removal of surgical wound dressing: Secondary | ICD-10-CM | POA: Diagnosis not present

## 2018-02-01 ENCOUNTER — Other Ambulatory Visit: Payer: Self-pay | Admitting: *Deleted

## 2018-02-01 NOTE — Patient Outreach (Signed)
[facility]) Care Management  02/01/2018  KAMONTE MCMICHEN 1951-06-25 [id number]  Referral via RED Alert-EMMI-Discharge; Day# 4, 01/29/2018 Reason; Sad/hopeless./empty/anxious? Yes  Telephone call to patient; who was advised of reason for call. HIPPA verification received.   Patient voices that he does not remember answering that question but if he did answer yes he may have been a bit anxious but he is fine now. States when he went into the hospital he did not anticipate to surgery and having to stay in over 2 weeks.   States his emotional state is good now and he fills very comfortable talking to his doctors about emotional concerns if he has any.   Voices that he has already seen his surgeon 05/21 for hospital follow up. States she has not seen primary care provider or set up appointment for follow up. Patient was advised to touch base with primary care provider to see he needs to schedule appointment to be seen.   Voices he is taking medications are prescribed.  Emmi-alert has been addressed.  Plan: Close out.  Sherrin Daisy, RN BSN [place] Management Coordinator [facility] Care Management  [phone]

## 2018-02-20 ENCOUNTER — Other Ambulatory Visit (HOSPITAL_COMMUNITY): Payer: PPO

## 2018-04-10 ENCOUNTER — Other Ambulatory Visit: Payer: Self-pay

## 2018-04-10 ENCOUNTER — Ambulatory Visit (HOSPITAL_COMMUNITY): Payer: PPO | Attending: Internal Medicine

## 2018-04-10 DIAGNOSIS — E785 Hyperlipidemia, unspecified: Secondary | ICD-10-CM | POA: Insufficient documentation

## 2018-04-10 DIAGNOSIS — I253 Aneurysm of heart: Secondary | ICD-10-CM | POA: Insufficient documentation

## 2018-04-10 DIAGNOSIS — I77810 Thoracic aortic ectasia: Secondary | ICD-10-CM | POA: Diagnosis not present

## 2018-04-11 ENCOUNTER — Telehealth: Payer: Self-pay | Admitting: Cardiology

## 2018-04-11 NOTE — Telephone Encounter (Signed)
New Message: ° ° ° ° ° ° °Pt is returning a call for test results ° °

## 2018-04-11 NOTE — Telephone Encounter (Signed)
Returned pts call and discussed his echo results. Pt verbalized understanding.

## 2018-04-11 NOTE — Telephone Encounter (Signed)
-----   Message from Imogene Burn, PA-C sent at 04/10/2018  3:07 PM EDT ----- Normal heart function, heart has a little trouble relaxing, mildly dilated ascending aorta.  Continue to monitor yearly.

## 2018-06-16 ENCOUNTER — Other Ambulatory Visit: Payer: Self-pay | Admitting: Oncology

## 2018-07-03 ENCOUNTER — Other Ambulatory Visit: Payer: Self-pay | Admitting: Oncology

## 2018-07-03 ENCOUNTER — Encounter: Payer: Self-pay | Admitting: Oncology

## 2018-07-03 ENCOUNTER — Ambulatory Visit (INDEPENDENT_AMBULATORY_CARE_PROVIDER_SITE_OTHER): Payer: PPO | Admitting: Oncology

## 2018-07-03 ENCOUNTER — Other Ambulatory Visit: Payer: Self-pay

## 2018-07-03 DIAGNOSIS — K7689 Other specified diseases of liver: Secondary | ICD-10-CM | POA: Diagnosis not present

## 2018-07-03 DIAGNOSIS — Z8719 Personal history of other diseases of the digestive system: Secondary | ICD-10-CM | POA: Diagnosis not present

## 2018-07-03 DIAGNOSIS — Z9889 Other specified postprocedural states: Secondary | ICD-10-CM | POA: Diagnosis not present

## 2018-07-03 NOTE — Patient Instructions (Signed)
To lab today Schedule repeat lab for 08/28/18 MD visit 12/17  I will see you one more time then transition your Hematology care to Dr Irene Limbo at the [facility]

## 2018-07-03 NOTE — Progress Notes (Signed)
Hematology and Oncology Follow Up Visit  Alexander Pollard [id number] [birth year] 67 y.o. 07/03/2018 5:00 PM   Principle Diagnosis: Encounter Diagnosis  Name Primary?  . 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 Further evaluation of mild liver function abnormalities revealed significant elevation of serum iron 264, percent saturation 97, and ferritin 1498. CT scan of the abdomen done 01/16/2014 showed overall normal appearance of the liver. Prominent spleen 16 cm. Subsequent ultrasound with elastography done on September 15 showed a normal-size spleen 13 cm. Several small cysts in the liver but no parenchymal mass. Fibrosis score F2 with some areas F3. He was started on a phlebotomy program on 06/11/2014. He had 11 , 500 mL phlebotomies at two-week intervals through 10/16/14 but ferritin did not come down and he was started on a weekly phlebotomy program beginning on February 10. Ferritin began to fall and he was changed to a every 2 week schedule beginning in May, 2016. He  had a steady decrease in his ferritin level data most recent value recorded of 117 on 10/22/15.  Interim History: Due to a secretarial error, the patient was not given a follow-up appointment and I have not seen him since January 2017. Only major medical problem that has occurred was a ruptured appendix and perforated colon requiring hospitalization January 09, 2018.  A second surgery had to be done on May 11 while he was still in the hospital and had dehiscence of his wounds after he coughed.  Hemoglobin on admission was 16.  At discharge May 11, 10.9. He feels he is just about completely healed.  Still some intra-abdominal discomfort at times.  Otherwise he feels well.  He is gaining back some of the weight he lost during the prolonged hospitalization for emergent abdominal surgery and subsequent wound dehiscence and later  infection.  Medications: reviewed  Allergies: No Known Allergies  Review of Systems: See interim history Remaining ROS negative:   Physical Exam: Blood pressure 132/89, pulse 82, temperature 98.2 F (36.8 C), temperature source Oral, height 5\' 7"  (1.702 m), weight 184 lb 14.4 oz (83.9 kg), SpO2 100 %. Wt Readings from Last 3 Encounters:  07/03/18 184 lb 14.4 oz (83.9 kg)  01/17/18 180 lb 5.4 oz (81.8 kg)  07/25/17 192 lb (87.1 kg)     General appearance: Well-nourished Caucasian man HENNT: Pharynx no erythema, exudate, mass, or ulcer. No thyromegaly or thyroid nodules Lymph nodes: No cervical, supraclavicular, or axillary lymphadenopathy Breasts:  Lungs: Clear to auscultation, resonant to percussion throughout Heart: Regular rhythm, no murmur, no gallop, no rub, no click, no edema Abdomen: Soft, nontender, normal bowel sounds, no mass, no organomegaly.  Well-healed midline abdominal incision. Extremities: No edema, no calf tenderness Musculoskeletal: no joint deformities GU:  Vascular: Carotid pulses 2+, no bruits, Neurologic: Alert, oriented, PERRLA,, cranial nerves grossly normal, motor strength 5 over 5, reflexes 1+ symmetric, upper body coordination normal, gait normal, Skin: No rash or ecchymosis  Lab Results: CBC W/Diff    Component Value Date/Time   WBC 10.2 01/22/2018 0315   RBC 3.55 (L) 01/22/2018 0315   HGB 10.9 (L) 01/22/2018 0315   HGB 15.5 09/10/2015 1117   HCT 32.8 (L) 01/22/2018 0315   HCT 43.7 09/10/2015 1117   PLT 320 01/22/2018 0315   PLT 185 09/10/2015 1117   MCV 92.4 01/22/2018 0315   MCV 95 09/10/2015 1117   MCH 30.7 01/22/2018 0315   MCHC 33.2 01/22/2018 0315  RDW 13.7 01/22/2018 0315   RDW 13.9 09/10/2015 1117   LYMPHSABS 0.8 01/10/2018 0321   LYMPHSABS 1.6 09/10/2015 1117   MONOABS 0.9 01/10/2018 0321   EOSABS 0.0 01/10/2018 0321   EOSABS 0.2 09/10/2015 1117   BASOSABS 0.0 01/10/2018 0321   BASOSABS 0.0 09/10/2015 1117     Chemistry       Component Value Date/Time   NA 138 01/21/2018 0458   NA 141 09/10/2015 1117   K 3.9 01/21/2018 0458   CL 104 01/21/2018 0458   CO2 27 01/21/2018 0458   BUN 13 01/21/2018 0458   BUN 14 09/10/2015 1117   CREATININE 0.87 01/21/2018 0458   CREATININE 0.90 03/19/2015 0925      Component Value Date/Time   CALCIUM 8.0 (L) 01/21/2018 0458   ALKPHOS 95 01/09/2018 2008   AST 41 01/09/2018 2008   ALT 41 01/09/2018 2008   BILITOT 2.7 (H) 01/09/2018 2008   BILITOT 1.3 (H) 09/10/2015 1117     Today's lab pending  Radiological Studies: No results found.  Impression:    Homozygous status for the C282Y hemochromatosis gene I will determine the appropriate phlebotomy schedule once today's lab values are available. I notified him that I will be retiring in the Spring next year.  I will transition his hematology care to Dr.Kale at the [facility].  CC: Patient Care Team: Lujean Amel, MD as PCP - General (Family Medicine) [name], MD as Consulting Physician (Oncology) Wilford Corner, MD as Consulting Physician (Gastroenterology) Sueanne Margarita, MD as Consulting Physician (Cardiology) [name], MD as Consulting Physician (Oncology)   Murriel Hopper, MD, [place]  Hematology-Oncology/Internal Medicine     10/21/20195:00 PM

## 2018-07-04 ENCOUNTER — Other Ambulatory Visit: Payer: Self-pay | Admitting: Oncology

## 2018-07-04 LAB — CBC WITH DIFFERENTIAL/PLATELET
BASOS ABS: 0 10*3/uL (ref 0.0–0.2)
Basos: 1 %
EOS (ABSOLUTE): 0.2 10*3/uL (ref 0.0–0.4)
Eos: 3 %
Hematocrit: 46 % (ref 37.5–51.0)
Hemoglobin: 15.9 g/dL (ref 13.0–17.7)
IMMATURE GRANS (ABS): 0 10*3/uL (ref 0.0–0.1)
IMMATURE GRANULOCYTES: 0 %
LYMPHS: 20 %
Lymphocytes Absolute: 1.5 10*3/uL (ref 0.7–3.1)
MCH: 32.2 pg (ref 26.6–33.0)
MCHC: 34.6 g/dL (ref 31.5–35.7)
MCV: 93 fL (ref 79–97)
Monocytes Absolute: 0.6 10*3/uL (ref 0.1–0.9)
Monocytes: 8 %
NEUTROS ABS: 5 10*3/uL (ref 1.4–7.0)
NEUTROS PCT: 68 %
PLATELETS: 198 10*3/uL (ref 150–450)
RBC: 4.94 x10E6/uL (ref 4.14–5.80)
RDW: 13.3 % (ref 12.3–15.4)
WBC: 7.3 10*3/uL (ref 3.4–10.8)

## 2018-07-04 LAB — COMPREHENSIVE METABOLIC PANEL
A/G RATIO: 2 (ref 1.2–2.2)
ALK PHOS: 84 IU/L (ref 39–117)
ALT: 28 IU/L (ref 0–44)
AST: 25 IU/L (ref 0–40)
Albumin: 4.5 g/dL (ref 3.6–4.8)
BILIRUBIN TOTAL: 1.6 mg/dL — AB (ref 0.0–1.2)
BUN/Creatinine Ratio: 8 — ABNORMAL LOW (ref 10–24)
BUN: 9 mg/dL (ref 8–27)
CHLORIDE: 105 mmol/L (ref 96–106)
CO2: 22 mmol/L (ref 20–29)
Calcium: 9.1 mg/dL (ref 8.6–10.2)
Creatinine, Ser: 1.11 mg/dL (ref 0.76–1.27)
GFR calc Af Amer: 79 mL/min/{1.73_m2} (ref >59)
GFR calc non Af Amer: 68 mL/min/{1.73_m2} (ref >59)
GLUCOSE: 96 mg/dL (ref 65–99)
Globulin, Total: 2.3 g/dL (ref 1.5–4.5)
POTASSIUM: 4.8 mmol/L (ref 3.5–5.2)
Sodium: 143 mmol/L (ref 134–144)
Total Protein: 6.8 g/dL (ref 6.0–8.5)

## 2018-07-04 LAB — FERRITIN: FERRITIN: 405 ng/mL — AB (ref 30–400)

## 2018-07-07 DIAGNOSIS — Z23 Encounter for immunization: Secondary | ICD-10-CM | POA: Diagnosis not present

## 2018-07-11 ENCOUNTER — Ambulatory Visit (HOSPITAL_COMMUNITY)
Admission: RE | Admit: 2018-07-11 | Discharge: 2018-07-11 | Disposition: A | Discharge status: Home / Self Care | Payer: PPO | Source: Ambulatory Visit | Attending: Oncology | Admitting: Oncology

## 2018-07-24 NOTE — Progress Notes (Signed)
Cardiology Office Note    Date:  07/25/2018   ID:  Alexander Pollard, DOB December 18, 1950, MRN [id number]  PCP:  Alexander Amel, MD  Cardiologist: Alexander Him, MD EPS: None  No chief complaint on file.   History of Present Illness:  Alexander Pollard is a 67 y.o. male with history of hemachromatosis, hypertensive response to exercise, low HDL, mildly dilated aortic root, diastolic dysfunction and family history of CAD. 2D echo 08/19/16: Normal LVEF 55-60%, with grade 1 DD mildly dilated aortic root (39) and ascending aorta (37). He underwent ETT 06/2015 due to atypical chest pain - had hypertensive response to exercise up to 195/107, prompting addition of Toprol. Nuc 07/2015 was normal, EF 60%. Ambulatory BP monitor 07/2015 was normal.   I last saw the patient 07/2017 at which time he was doing well.  2D echo 04/10/2018 normal LV function with mild grade 1 DD and mildly dilated ascending aorta.  Dr. Radford Pax recommended yearly follow-up.  Patient comes in for f/u. Had a ruptured appendix in May and had to have a right colectomy. Still some soreness. Walking a lot. Just getting back into running and tried a 5k last week.  Denies any chest pain, palpitations, dyspnea, dyspnea on exertion, dizziness or presyncope.  Blood pressure is well controlled.   Past Medical History:  Diagnosis Date  . Dilated aortic root ([place])   . Dyslipidemia    a. h/o low HDL.  . Enlarged aorta ([place])   . Family history of early CAD   . 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 Marked hypertensive response to exercise by ETT 06/2016 prompting addition of Toprol. b. F/u amb BP monitor normal.    Past Surgical History:  Procedure Laterality Date  . COLONOSCOPY     next due 08/2017  . IR THORACENTESIS ASP PLEURAL SPACE W/IMG GUIDE  01/16/2018  . IR THORACENTESIS ASP PLEURAL SPACE W/IMG GUIDE  01/18/2018  . LAPAROTOMY N/A 01/09/2018     Procedure: EXPLORATORY LAPAROTOMY, RIGHT COLECTOMY, PLACEMENT OF WOUND VAC TO ABDOMEN, DRAINAGE OF RETROPERITONEAL ABSCESS;  Surgeon: Rolm Bookbinder, MD;  Location: [facility];  Service: General;  Laterality: N/A;  . LAPAROTOMY N/A 01/17/2018   Procedure: EXPLORATORY LAPAROTOMY;  Surgeon: Donnie Mesa, MD;  Location: [facility];  Service: General;  Laterality: N/A;    Current Medications: Current Meds  Medication Sig  . acetaminophen (TYLENOL) 325 MG tablet Take 2 tablets (650 mg total) by mouth every 6 (six) hours as needed.  Marland Kitchen aspirin EC 81 MG tablet Take 81 mg by mouth daily.  . metoprolol succinate (TOPROL XL) 25 MG 24 hr tablet Take 1 tablet (25 mg total) by mouth daily.     Allergies:   Patient has no known allergies.   Social History   Socioeconomic History  . Marital status: Divorced    Spouse name: Not on file  . Number of children: Not on file  . Years of education: Not on file  . Highest education level: Not on file  Occupational History  . Not on file  Social Needs  . Financial resource strain: Not on file  . Food insecurity:    Worry: Not on file    Inability: Not on file  . Transportation needs:    Medical: Not on file    Non-medical: Not on file  Tobacco Use  . Smoking status: Former Research scientist (life sciences)  . Smokeless  tobacco: Never Used  Substance and Sexual Activity  . Alcohol use: Yes    Alcohol/week: 0.0 standard drinks    Comment: Beer weekly.  . Drug use: No  . Sexual activity: Not on file  Lifestyle  . Physical activity:    Days per week: Not on file    Minutes per session: Not on file  . Stress: Not on file  Relationships  . Social connections:    Talks on phone: Not on file    Gets together: Not on file    Attends religious service: Not on file    Active member of club or organization: Not on file    Attends meetings of clubs or organizations: Not on file    Relationship status: Not on file  Other Topics Concern  . Not on file  Social History Narrative   . Not on file     Family History:  The patient's family history includes Heart attack in his mother; Heart disease in his brother and mother; Leukemia in his brother; Lymphoma in his brother.   ROS:   Please see the history of present illness.    Review of Systems  Constitution: Negative.  HENT: Negative.   Cardiovascular: Negative.   Respiratory: Negative.   Endocrine: Negative.   Hematologic/Lymphatic: Negative.   Musculoskeletal: Negative.   Gastrointestinal: Negative.   Genitourinary: Negative.   Neurological: Negative.    All other systems reviewed and are negative.   PHYSICAL EXAM:   VS:  BP 124/80   Pulse 75   Ht 5\' 8"  (1.727 m)   Wt 186 lb (84.4 kg)   SpO2 98%   BMI 28.28 kg/m   Physical Exam  GEN: Well nourished, well developed, in no acute distress  Neck: no JVD, carotid bruits, or masses Cardiac:RRR; no murmurs, rubs, or gallops  Respiratory:  clear to auscultation bilaterally, normal work of breathing GI: soft, nontender, nondistended, + BS Ext: without cyanosis, clubbing, or edema, Good distal pulses bilaterally Neuro:  Alert and Oriented x 3, Psych: euthymic mood, full affect  Wt Readings from Last 3 Encounters:  07/25/18 186 lb (84.4 kg)  07/11/18 180 lb (81.6 kg)  07/03/18 184 lb 14.4 oz (83.9 kg)      Studies/Labs Reviewed:   EKG:  EKG is notordered today.   Recent Labs: 01/18/2018: Magnesium 2.1 07/03/2018: ALT 28; BUN 9; Creatinine, Ser 1.11; Hemoglobin 15.9; Platelets 198; Potassium 4.8; Sodium 143   Lipid Panel No results found for: CHOL, TRIG, HDL, CHOLHDL, VLDL, LDLCALC, LDLDIRECT  Additional studies/ records that were reviewed today include:  2D echo 03/2018 Study Conclusions   - Left ventricle: The cavity size was normal. Wall thickness was   normal. Systolic function was normal. The estimated ejection   fraction was in the range of 55% to 60%. Wall motion was normal;   there were no regional wall motion abnormalities. Doppler    parameters are consistent with abnormal left ventricular   relaxation (grade 1 diastolic dysfunction). - Ascending aorta: The ascending aorta was mildly dilated. - Atrial septum: There was an atrial septal aneurysm.   Impressions:   - Normal LV systolic function; mild diastolic dysfunction; mildly   dilated ascending aorta.   Nuclear stress test 2016Study Highlights    The left ventricular ejection fraction is normal (55-65%).  Nuclear stress EF: 60%.  There was no ST segment deviation noted during stress.  The study is normal.  This is a low risk study.   This is a  normal pharmacologic stress study with no infarct and no ischemia.        ASSESSMENT:    1. 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 Dyslipidemia   3. Family history of early CAD   22. Dilated aortic root ([place])   5. 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  Hyperlipidemia lipid profile a year ago was excellent.  He is due for repeat.  Family history of early CAD  Dilated aortic root only mild on echo 03/2018 repeat in a year  Hereditary hemochromatosis followed by Dr. Beryle Beams and managed with periodic phlebotomies.  Scheduled to have 2 in the next month or so.    Medication Adjustments/Labs and Tests Ordered: Current medicines are reviewed at length with the patient today.  Concerns regarding medicines are outlined above.  Medication changes, Labs and Tests ordered today are listed in the Patient Instructions below. Patient Instructions  Medication Instructions:  Your physician recommends that you continue on your current medications as directed. Please refer to the Current Medication list given to you today.  If you need a refill on your cardiac medications before your next appointment, please call your pharmacy.   Lab  work: None Ordered  If you have labs (blood work) drawn today and your tests are completely normal, you will receive your results only by: Marland Kitchen MyChart Message (if you have MyChart) OR . A paper copy in the mail If you have any lab test that is abnormal or we need to change your treatment, we will call you to review the results.  Testing/Procedures: None ordered  Follow-Up: At [facility], you and your health needs are our priority.  As part of our continuing mission to provide you with exceptional heart care, we have created designated Provider Care Teams.  These Care Teams include your primary Cardiologist (physician) and Advanced Practice Providers (APPs -  Physician Assistants and Nurse Practitioners) who all work together to provide you with the care you need, when you need it. . You will need a follow up appointment in 1 year.  Please call our office 2 months in advance to schedule this appointment.  You may see Alexander Him, MD or one of the following Advanced Practice Providers on your designated Care Team:   . Lyda Jester, PA-C . Dayna Dunn, PA-C . Ermalinda Barrios, PA-C  Any Other Special Instructions Will Be Listed Below (If Applicable).       Sumner Boast, PA-C  07/25/2018 9:16 AM    [facility] [address], [place], [place]  [address] Phone: [phone]; Fax: [phone]

## 2018-07-25 ENCOUNTER — Ambulatory Visit (INDEPENDENT_AMBULATORY_CARE_PROVIDER_SITE_OTHER): Payer: PPO | Admitting: Physician Assistant

## 2018-07-25 ENCOUNTER — Encounter: Payer: Self-pay | Admitting: Physician Assistant

## 2018-07-25 VITALS — BP 124/80 | HR 75 | Ht 68.0 in | Wt 186.0 lb

## 2018-07-25 DIAGNOSIS — E785 Hyperlipidemia, unspecified: Secondary | ICD-10-CM | POA: Diagnosis not present

## 2018-07-25 DIAGNOSIS — I77810 Thoracic aortic ectasia: Secondary | ICD-10-CM

## 2018-07-25 DIAGNOSIS — Z8249 Family history of ischemic heart disease and other diseases of the circulatory system: Secondary | ICD-10-CM | POA: Diagnosis not present

## 2018-07-25 DIAGNOSIS — I10 Essential (primary) hypertension: Secondary | ICD-10-CM

## 2018-07-25 NOTE — Patient Instructions (Signed)

## 2018-08-08 ENCOUNTER — Ambulatory Visit (HOSPITAL_COMMUNITY)
Admission: RE | Admit: 2018-08-08 | Discharge: 2018-08-08 | Disposition: A | Discharge status: Home / Self Care | Payer: PPO | Source: Ambulatory Visit | Attending: Oncology | Admitting: Oncology

## 2018-08-08 LAB — POCT HEMOGLOBIN-HEMACUE: Hemoglobin: 16.3 g/dL (ref 13.0–17.0)

## 2018-08-08 NOTE — Progress Notes (Signed)
After drinking water (denied need for food) and waiting 15 minutes, pt DC home. Denied dizziness, nausea or pain. VSS.

## 2018-08-08 NOTE — Progress Notes (Addendum)
Pt in for removal of 500 ml blood via therapeutic phlebotomy per MD order. Hemocue 16.3 today. Removed from left antecubital area without difficulty. Pt tolerated well. VSS.

## 2018-08-28 ENCOUNTER — Other Ambulatory Visit: Payer: PPO

## 2018-08-29 ENCOUNTER — Encounter: Payer: Self-pay | Admitting: Oncology

## 2018-08-29 ENCOUNTER — Ambulatory Visit: Payer: PPO | Admitting: Oncology

## 2018-08-29 ENCOUNTER — Ambulatory Visit (INDEPENDENT_AMBULATORY_CARE_PROVIDER_SITE_OTHER): Payer: PPO | Admitting: Oncology

## 2018-08-29 ENCOUNTER — Other Ambulatory Visit: Payer: Self-pay | Admitting: Oncology

## 2018-08-29 ENCOUNTER — Other Ambulatory Visit: Payer: Self-pay

## 2018-08-29 DIAGNOSIS — Z8719 Personal history of other diseases of the digestive system: Secondary | ICD-10-CM | POA: Diagnosis not present

## 2018-08-29 DIAGNOSIS — Z79899 Other long term (current) drug therapy: Secondary | ICD-10-CM | POA: Diagnosis not present

## 2018-08-29 DIAGNOSIS — Z0001 Encounter for general adult medical examination with abnormal findings: Secondary | ICD-10-CM | POA: Diagnosis not present

## 2018-08-29 DIAGNOSIS — K7689 Other specified diseases of liver: Secondary | ICD-10-CM | POA: Diagnosis not present

## 2018-08-29 DIAGNOSIS — I10 Essential (primary) hypertension: Secondary | ICD-10-CM | POA: Diagnosis not present

## 2018-08-29 DIAGNOSIS — I714 Abdominal aortic aneurysm, without rupture: Secondary | ICD-10-CM | POA: Diagnosis not present

## 2018-08-29 DIAGNOSIS — R7301 Impaired fasting glucose: Secondary | ICD-10-CM | POA: Diagnosis not present

## 2018-08-29 DIAGNOSIS — Z125 Encounter for screening for malignant neoplasm of prostate: Secondary | ICD-10-CM | POA: Diagnosis not present

## 2018-08-29 LAB — CBC WITH DIFFERENTIAL/PLATELET
BASOS ABS: 0.1 10*3/uL (ref 0.0–0.2)
Basos: 1 %
EOS (ABSOLUTE): 0.3 10*3/uL (ref 0.0–0.4)
Eos: 4 %
Hematocrit: 43.4 % (ref 37.5–51.0)
Hemoglobin: 15.5 g/dL (ref 13.0–17.7)
IMMATURE GRANS (ABS): 0 10*3/uL (ref 0.0–0.1)
IMMATURE GRANULOCYTES: 0 %
LYMPHS: 21 %
Lymphocytes Absolute: 1.5 10*3/uL (ref 0.7–3.1)
MCH: 33.7 pg — ABNORMAL HIGH (ref 26.6–33.0)
MCHC: 35.7 g/dL (ref 31.5–35.7)
MCV: 94 fL (ref 79–97)
Monocytes Absolute: 0.6 10*3/uL (ref 0.1–0.9)
Monocytes: 8 %
NEUTROS PCT: 66 %
Neutrophils Absolute: 4.7 10*3/uL (ref 1.4–7.0)
PLATELETS: 175 10*3/uL (ref 150–450)
RBC: 4.6 x10E6/uL (ref 4.14–5.80)
RDW: 13 % (ref 12.3–15.4)
WBC: 7.1 10*3/uL (ref 3.4–10.8)

## 2018-08-29 LAB — FERRITIN: Ferritin: 278 ng/mL (ref 30–400)

## 2018-08-29 NOTE — Progress Notes (Signed)
Hematology and Oncology Follow Up Visit  Alexander Pollard [id number] Jan 01, 1951 67 y.o. 08/29/2018 3:23 PM   Principle Diagnosis: Encounter Diagnosis  Name Primary?  . 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 Further evaluation of mild liver function abnormalities revealed significant elevation of serum iron 264, percent saturation 97, and ferritin 1498. CT scan of the abdomen done 01/16/2014 showed overall normal appearance of the liver. Prominent spleen 16 cm. Subsequent ultrasound with elastography done on September 15 showed a normal-size spleen 13 cm. Several small cysts in the liver but no parenchymal mass. Fibrosis score F2 with some areas F3. He was started on a phlebotomy program on 06/11/2014. He had 11 , 500 mL phlebotomies at two-week intervals through 10/16/14 but ferritin did not come down and he was started on a weekly phlebotomy program beginning on February 10. Ferritin began to fall and he was changed to a every 2 week schedule beginning in May, 2016. He  had a steady decrease in his ferritin level data most recent value recorded of 117 on 10/22/15. Due to a secretarial error, the patient was not given a follow-up appointment and I have not seen him since January 2017.  He reestablished with [place] recently on July 03, 2018 when I discovered this oversight.  Only major interim medical problem that had occurred was a ruptured appendix and perforated colon requiring hospitalization January 09, 2018.  A second surgery had to be done on May 11 while he was still in the hospital and had dehiscence of his wounds after he coughed.  Hemoglobin on admission was 16.  At discharge May 11. He was put back on a phlebotomy program on October 29.   Interim History: Short interval follow-up visit to make sure we have him back on track with his phlebotomy program.  As noted above, he fell off the radar screen  for 2 years.  When he reestablished with [place] recently his ferritin had climbed from 117 in February 2017 to 405 in October 2019.  He is now back on a monthly phlebotomy program with target ferritin of less than or equal to 100 and then we can space the phlebotomies out again.  He had a phlebotomy on October 21.  Ferritin obtained prior to a phlebotomy just on on December 16 down to 278. He remains asymptomatic.  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  No interim medical problems.  Medications: reviewed  Allergies: No Known Allergies  Review of Systems:  Remaining ROS negative:   Physical Exam: Blood pressure 125/76, pulse 66, temperature 97.7 F (36.5 C), temperature source Oral, height 5\' 7"  (1.702 m), weight 186 lb 11.2 oz (84.7 kg), SpO2 100 %. Wt Readings from Last 3 Encounters:  08/29/18 186 lb 11.2 oz (84.7 kg)  08/08/18 183 lb (83 kg)  07/25/18 186 lb (84.4 kg)     General appearance: Well-nourished Caucasian man HENNT: Pharynx no erythema, exudate, mass, or ulcer. No thyromegaly or thyroid nodules Lymph nodes: No cervical, supraclavicular, or axillary lymphadenopathy Breasts:  Lungs: Clear to auscultation, resonant to percussion throughout Heart: Regular rhythm, no murmur, no gallop, no rub, no click, no edema Abdomen: Soft, nontender, normal bowel sounds, no mass, no organomegaly Extremities: No edema, no calf tenderness Musculoskeletal: no joint deformities GU:  Vascular: Carotid pulses 2+, no bruits,  Neurologic: Alert, oriented, PERRLA,  cranial nerves grossly normal, motor strength 5 over 5, reflexes 1+ symmetric,  upper body coordination normal, gait normal, Skin: No rash or ecchymosis  Lab Results: CBC W/Diff    Component Value Date/Time   WBC 7.1 08/28/2018 0948   WBC 10.2 01/22/2018 0315   RBC 4.60 08/28/2018 0948   RBC 3.55 (L) 01/22/2018 0315   HGB 15.5 08/28/2018 0948   HCT 43.4 08/28/2018 0948   PLT 175 08/28/2018 0948   MCV 94  08/28/2018 0948   MCH 33.7 (H) 08/28/2018 0948   MCH 30.7 01/22/2018 0315   MCHC 35.7 08/28/2018 0948   MCHC 33.2 01/22/2018 0315   RDW 13.0 08/28/2018 0948   LYMPHSABS 1.5 08/28/2018 0948   MONOABS 0.9 01/10/2018 0321   EOSABS 0.3 08/28/2018 0948   BASOSABS 0.1 08/28/2018 0948     Chemistry      Component Value Date/Time   NA 143 07/03/2018 1623   K 4.8 07/03/2018 1623   CL 105 07/03/2018 1623   CO2 22 07/03/2018 1623   BUN 9 07/03/2018 1623   CREATININE 1.11 07/03/2018 1623   CREATININE 0.90 03/19/2015 0925      Component Value Date/Time   CALCIUM 9.1 07/03/2018 1623   ALKPHOS 84 07/03/2018 1623   AST 25 07/03/2018 1623   ALT 28 07/03/2018 1623   BILITOT 1.6 (H) 07/03/2018 1623       Radiological Studies: No results found.  Impression:  1.  Homozygous for the C282Y hemochromatosis gene  Stable back on a monthly phlebotomy program.  I will schedule him through March 2020 then transition his care to Dr. Irene Limbo at the [facility].  2.  History of ruptured appendix and perforated colon requiring emergency surgery April 2019. Now fully recovered.  CC: Patient Care Team: Lujean Amel, MD as PCP - General (Family Medicine) Sueanne Margarita, MD as PCP - Cardiology (Cardiology) [place] Physician (Oncology) Wilford Corner, MD as Consulting Physician (Gastroenterology) Sueanne Margarita, MD as Consulting Physician (Cardiology) [name], MD as Consulting Physician (Oncology)   Murriel Hopper, MD, [place]  Hematology-Oncology/Internal Medicine     12/17/20193:23 PM

## 2018-08-29 NOTE — Patient Instructions (Addendum)
Continue phlebotomy monthly for now, next 09/05/18 then 1.21.20, 10/30/18, 11/27/18  We will transition your care to Dr Irene Limbo,  one of the Hematologists at [facility]. If you haven't heard anything by the end of January, please call [phone]  It has been a pleasure working with you!

## 2018-08-30 ENCOUNTER — Other Ambulatory Visit: Payer: Self-pay | Admitting: Physician Assistant

## 2018-08-30 DIAGNOSIS — I10 Essential (primary) hypertension: Secondary | ICD-10-CM

## 2018-09-05 ENCOUNTER — Ambulatory Visit (HOSPITAL_COMMUNITY)
Admission: RE | Admit: 2018-09-05 | Discharge: 2018-09-05 | Disposition: A | Discharge status: Home / Self Care | Payer: PPO | Source: Ambulatory Visit | Attending: Oncology | Admitting: Oncology

## 2018-09-05 LAB — COMPREHENSIVE METABOLIC PANEL
ALBUMIN: 3.9 g/dL (ref 3.5–5.0)
ALT: 25 U/L (ref 0–44)
ANION GAP: 7 (ref 5–15)
AST: 24 U/L (ref 15–41)
Alkaline Phosphatase: 67 U/L (ref 38–126)
BILIRUBIN TOTAL: 1.4 mg/dL — AB (ref 0.3–1.2)
BUN: 14 mg/dL (ref 8–23)
CO2: 22 mmol/L (ref 22–32)
Calcium: 9.4 mg/dL (ref 8.9–10.3)
Chloride: 108 mmol/L (ref 98–111)
Creatinine, Ser: 0.9 mg/dL (ref 0.61–1.24)
GFR calc non Af Amer: >60 mL/min (ref >60)
GLUCOSE: 144 mg/dL — AB (ref 70–99)
POTASSIUM: 4 mmol/L (ref 3.5–5.1)
Sodium: 137 mmol/L (ref 135–145)
TOTAL PROTEIN: 6.7 g/dL (ref 6.5–8.1)

## 2018-09-05 LAB — FERRITIN: FERRITIN: 156 ng/mL (ref 24–336)

## 2018-09-05 NOTE — Progress Notes (Signed)
Pt arrived at 0900 for therapeutic phlebotomy per Dr. Beryle Beams as long as hemocue above 12. Hemocue 15.7 today. Labs drawn prior to as ordered. 500 ml removed from right antecubital. Pt tolerated well. VSS.

## 2018-09-08 ENCOUNTER — Telehealth: Payer: Self-pay | Admitting: *Deleted

## 2018-09-08 LAB — POCT HEMOGLOBIN-HEMACUE: HEMOGLOBIN: 15.7 g/dL (ref 13.0–17.0)

## 2018-09-08 NOTE — Telephone Encounter (Signed)
Called pt - no answer; left message to give me a call back. 

## 2018-09-08 NOTE — Telephone Encounter (Signed)
-----   Message from [name], MD sent at 09/07/2018  9:48 AM EST ----- Call pt: ferritin coming down nicely: 156; liver chemistries good; blood sugar running a little high at 144. We will send copy of labs to his primary. Continue monthly phlebotomy until ferritin <100 then we can space them out again

## 2018-09-11 NOTE — Telephone Encounter (Signed)
Pt called / informed "ferritin coming down nicely: 156; liver chemistries good; blood sugar running a little high at 144. We will send copy of labs to his primary. Continue monthly phlebotomy until ferritin <100 then we can space them out again " per Dr Beryle Beams. Stated he's unsure why his BS was high which "it never is".

## 2018-10-03 ENCOUNTER — Encounter (HOSPITAL_COMMUNITY)
Admission: RE | Admit: 2018-10-03 | Discharge: 2018-10-03 | Disposition: A | Discharge status: Home / Self Care | Payer: PPO | Source: Ambulatory Visit | Attending: Oncology | Admitting: Oncology

## 2018-10-03 ENCOUNTER — Other Ambulatory Visit: Payer: Self-pay | Admitting: Oncology

## 2018-10-03 LAB — POCT HEMOGLOBIN-HEMACUE: Hemoglobin: 15.3 g/dL (ref 13.0–17.0)

## 2018-10-26 ENCOUNTER — Telehealth: Payer: Self-pay | Admitting: *Deleted

## 2018-10-26 NOTE — Telephone Encounter (Signed)
Opened in error. S. Hashem Goynes, Therapist, sports, BSN

## 2018-10-31 ENCOUNTER — Ambulatory Visit (HOSPITAL_COMMUNITY)
Admission: RE | Admit: 2018-10-31 | Discharge: 2018-10-31 | Disposition: A | Discharge status: Home / Self Care | Payer: PPO | Source: Ambulatory Visit | Attending: Oncology | Admitting: Oncology

## 2018-10-31 NOTE — Progress Notes (Signed)
Hemocue 15.7. Per Dr. Beryle Beams remove 500 ml blood via therapeutic phlebotomy which was done. Removed from left antecubital area. Pt tolerated well. VSS.

## 2018-11-01 LAB — POCT HEMOGLOBIN-HEMACUE: Hemoglobin: 15.7 g/dL (ref 13.0–17.0)

## 2018-11-27 ENCOUNTER — Other Ambulatory Visit (HOSPITAL_COMMUNITY): Payer: Self-pay | Admitting: *Deleted

## 2018-11-28 ENCOUNTER — Encounter (HOSPITAL_COMMUNITY): Payer: PPO

## 2018-11-28 ENCOUNTER — Encounter: Payer: Self-pay | Admitting: *Deleted

## 2018-11-29 ENCOUNTER — Encounter: Payer: Self-pay | Admitting: *Deleted

## 2018-12-08 ENCOUNTER — Telehealth: Payer: Self-pay | Admitting: Hematology

## 2018-12-08 NOTE — Telephone Encounter (Signed)
Pt cld to schedule an appt to see a hematologist. He is a former pt of Dr. Irene Limbo and has been scheduled for a new patient appt on 5/18 at 10am. Pt agreed to the appt date and time.

## 2019-01-01 ENCOUNTER — Encounter (HOSPITAL_COMMUNITY): Payer: PPO

## 2019-01-26 NOTE — Progress Notes (Signed)
HEMATOLOGY/ONCOLOGY CONSULTATION NOTE  Date of Service: 01/29/2019  Patient Care Team: Lujean Amel, MD as PCP - General (Family Medicine) Sueanne Margarita, MD as PCP - Cardiology (Cardiology) [name], MD as Consulting Physician (Oncology) Wilford Corner, MD as Consulting Physician (Gastroenterology) Sueanne Margarita, MD as Consulting Physician (Cardiology) [name], MD as Consulting Physician (Oncology)  CHIEF COMPLAINTS/PURPOSE OF CONSULTATION:  Hereditary Hemochromatosis  HISTORY OF PRESENTING ILLNESS:   Alexander Pollard is a wonderful 68 y.o. male who has been referred to [place] by Dr. Murriel Hopper for evaluation and management of Hereditary Hemochromatosis. The pt reports that he is doing well overall.   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 Initial workup was significatn for mild liver function abnormalities, serum iron at 264 with a 97% saturation and a Ferritin of 1498. A 01/16/14 CT A/P was obtained which revealed overall normal appearance of the liver with splenomegaly of 16cm, which was then seen to decrease to 13cm on 05/28/14 US Abdomen, and fibrosis noted. He began regular phlebotomies in September 2015, but then due to a "secretarial error," was lost to follow up between January 2017 and October 2019, and subsequently resumed regular phlebotomies in October 2019.  The pt reports that he had elevated liver functions for a few years prior to his initial diagnosis in April 2015. He denies family history of early heart or liver disease. The pt's brother also has hereditary hemochromatosis.  The pt notes that his last phlebotomy was in January of February. He denies any problems tolerating his therapeutic phlebotomies. He notes that he did not experience a worsened symptomatic difference in the 2.5 year interim in which he was lost to follow up.  Most recent lab results: 10/31/18 HGB  at 15.7 09/05/18 Ferritin at 156  On review of systems, pt reports good energy levels, staying active, eating well,  and denies joint pains or swellings, abdominal pains, leg swelling, and any other symptoms.   On PMHx the pt reports appendectomy in May 2019 denies liver or heart disease. On Family Hx the pt reports brother with hereditary hemochromatosis   MEDICAL HISTORY:  Past Medical History:  Diagnosis Date  . Dilated aortic root ([place])   . Dyslipidemia    a. h/o low HDL.  . Enlarged aorta ([place])   . Family history of early CAD   . 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 Marked hypertensive response to exercise by ETT 06/2016 prompting addition of Toprol. b. F/u amb BP monitor normal.    SURGICAL HISTORY: Past Surgical History:  Procedure Laterality Date  . COLONOSCOPY     next due 08/2017  . IR THORACENTESIS ASP PLEURAL SPACE W/IMG GUIDE  01/16/2018  . IR THORACENTESIS ASP PLEURAL SPACE W/IMG GUIDE  01/18/2018  . LAPAROTOMY N/A 01/09/2018   Procedure: EXPLORATORY LAPAROTOMY, RIGHT COLECTOMY, PLACEMENT OF WOUND VAC TO ABDOMEN, DRAINAGE OF RETROPERITONEAL ABSCESS;  Surgeon: Rolm Bookbinder, MD;  Location: [facility];  Service: General;  Laterality: N/A;  . LAPAROTOMY N/A 01/17/2018   Procedure: EXPLORATORY LAPAROTOMY;  Surgeon: Donnie Mesa, MD;  Location: [facility];  Service: General;  Laterality: N/A;    SOCIAL HISTORY: Social History   Socioeconomic History  . Marital status: Divorced    Spouse name: Not on file  . Number of children: Not on file  . Years of  education: Not on file  . Highest education level: Not on file  Occupational History  . Not on file  Social Needs  . Financial resource strain: Not on file  . Food insecurity:    Worry: Not on file    Inability: Not on file  . Transportation needs:    Medical: Not on file    Non-medical: Not on file  Tobacco Use  .  Smoking status: Former Research scientist (life sciences)  . Smokeless tobacco: Never Used  Substance and Sexual Activity  . Alcohol use: Yes    Alcohol/week: 0.0 standard drinks    Comment: Beer weekly.  . Drug use: No  . Sexual activity: Not on file  Lifestyle  . Physical activity:    Days per week: Not on file    Minutes per session: Not on file  . Stress: Not on file  Relationships  . Social connections:    Talks on phone: Not on file    Gets together: Not on file    Attends religious service: Not on file    Active member of club or organization: Not on file    Attends meetings of clubs or organizations: Not on file    Relationship status: Not on file  . Intimate partner violence:    Fear of current or ex partner: Not on file    Emotionally abused: Not on file    Physically abused: Not on file    Forced sexual activity: Not on file  Other Topics Concern  . Not on file  Social History Narrative  . Not on file    FAMILY HISTORY: Family History  Problem Relation Age of Onset  . Heart attack Mother   . Heart disease Mother   . Leukemia Brother   . Lymphoma Brother   . Heart disease Brother     ALLERGIES:  has No Known Allergies.  MEDICATIONS:  Current Outpatient Medications  Medication Sig Dispense Refill  . acetaminophen (TYLENOL) 325 MG tablet Take 2 tablets (650 mg total) by mouth every 6 (six) hours as needed.    Marland Kitchen aspirin EC 81 MG tablet Take 81 mg by mouth daily.    . metoprolol succinate (TOPROL-XL) 25 MG 24 hr tablet TAKE 1 TABLET BY MOUTH EVERY DAY 90 tablet 3   No current facility-administered medications for this visit.     REVIEW OF SYSTEMS:    10 Point review of Systems was done is negative except as noted above.  PHYSICAL EXAMINATION:  . Vitals:   01/29/19 1056  BP: (!) 145/99  Pulse: 67  Resp: 18  Temp: 98.2 F (36.8 C)  SpO2: 98%   Filed Weights   01/29/19 1056  Weight: 189 lb 1.6 oz (85.8 kg)   .Body mass index is 28.75 kg/m.  GENERAL:alert, in no  acute distress and comfortable SKIN: no acute rashes, no significant lesions EYES: conjunctiva are pink and non-injected, sclera anicteric OROPHARYNX: MMM, no exudates, no oropharyngeal erythema or ulceration NECK: supple, no JVD LYMPH:  no palpable lymphadenopathy in the cervical, axillary or inguinal regions LUNGS: clear to auscultation b/l with normal respiratory effort HEART: regular rate & rhythm ABDOMEN:  normoactive bowel sounds , non tender, not distended. Extremity: no pedal edema PSYCH: alert & oriented x 3 with fluent speech NEURO: no focal motor/sensory deficits  LABORATORY DATA:  I have reviewed the data as listed  . CBC Latest Ref Rng & Units 10/31/2018 10/03/2018 09/05/2018  WBC 3.4 - 10.8 x10E3/uL - - -  Hemoglobin  13.0 - 17.0 g/dL 15.7 15.3 15.7  Hematocrit 37.5 - 51.0 % - - -  Platelets 150 - 450 x10E3/uL - - -    . CMP Latest Ref Rng & Units 09/05/2018 07/03/2018 01/21/2018  Glucose 70 - 99 mg/dL 144(H) 96 89  BUN 8 - 23 mg/dL 14 9 13   Creatinine 0.61 - 1.24 mg/dL 0.90 1.11 0.87  Sodium 135 - 145 mmol/L 137 143 138  Potassium 3.5 - 5.1 mmol/L 4.0 4.8 3.9  Chloride 98 - 111 mmol/L 108 105 104  CO2 22 - 32 mmol/L 22 22 27   Calcium 8.9 - 10.3 mg/dL 9.4 9.1 8.0(L)  Total Protein 6.5 - 8.1 g/dL 6.7 6.8 -  Total Bilirubin 0.3 - 1.2 mg/dL 1.4(H) 1.6(H) -  Alkaline Phos 38 - 126 U/L 67 84 -  AST 15 - 41 U/L 24 25 -  ALT 0 - 44 U/L 25 28 -     RADIOGRAPHIC STUDIES: I have personally reviewed the radiological images as listed and agreed with the findings in the report. No results found.  ASSESSMENT & PLAN:  68 y.o. male with  1. Hereditary Hemochromatosis April 2015 homozygous C282Y mutation  April 2015 Ferritin at 1498 upon initial diagnosis 01/15/18 CT A/P revealed splenomegaly with spleen at 15.6cm Pt denies concern for heart of liver disease  PLAN: -Discussed patient's most recent labs from 10/31/18, HGB normal at 15.7. Last Ferritin from 09/05/18 was at  156 -Goal for Ferritin between 50 and 100 -Recommend that patient's children consider genetic testing to rule out hereditary hemochromatosis -Discussed that alcohol and red meat consumption can increase ferritin levels and increase frequency of the necessity for phlebotomy -Avoid raw seafood consumption -If signs of liver cirrhosis, would recommend monitoring liver with regular imaging with PCP -Recommended that the pt continue to eat well, drink at least 48-64 oz of water each day, and walk 20-30 minutes each day. -Will order labs today -Will set pt up for therapeutic phlebotomy in 1 week -Will see the pt back in 3 months   Labs today Starting Therapeutic phlebotomies in 1 week (freq to be determined by labs today)   All of the patients questions were answered with apparent satisfaction. The patient knows to call the clinic with any problems, questions or concerns.  The total time spent in the appt was 35 minutes and more than 50% was on counseling and direct patient cares.    Sullivan Lone MD MS AAHIVMS [facility] Hematology/Oncology Physician [facility]  (Office):       [phone] (Work cell):  [phone] (Fax):           [phone]  01/29/2019 11:19 AM  I, Baldwin [place], am acting as a scribe for Dr. Sullivan Lone.   .I have reviewed the above documentation for accuracy and completeness, and I agree with the above. Brunetta Genera MD

## 2019-01-29 ENCOUNTER — Other Ambulatory Visit: Payer: Self-pay

## 2019-01-29 ENCOUNTER — Inpatient Hospital Stay: Payer: PPO | Attending: Hematology | Admitting: Hematology

## 2019-01-29 ENCOUNTER — Inpatient Hospital Stay: Payer: PPO

## 2019-01-29 ENCOUNTER — Telehealth: Payer: Self-pay | Admitting: Hematology

## 2019-01-29 DIAGNOSIS — Z79899 Other long term (current) drug therapy: Secondary | ICD-10-CM

## 2019-01-29 DIAGNOSIS — R161 Splenomegaly, not elsewhere classified: Secondary | ICD-10-CM | POA: Diagnosis not present

## 2019-01-29 LAB — CMP (CANCER CENTER ONLY)
ALT: 22 U/L (ref 0–44)
AST: 22 U/L (ref 15–41)
Albumin: 4.5 g/dL (ref 3.5–5.0)
Alkaline Phosphatase: 79 U/L (ref 38–126)
Anion gap: 8 (ref 5–15)
BUN: 10 mg/dL (ref 8–23)
CO2: 26 mmol/L (ref 22–32)
Calcium: 9.4 mg/dL (ref 8.9–10.3)
Chloride: 106 mmol/L (ref 98–111)
Creatinine: 1.08 mg/dL (ref 0.61–1.24)
GFR, Est AFR Am: >60 mL/min (ref >60)
GFR, Estimated: >60 mL/min (ref >60)
Glucose, Bld: 125 mg/dL — ABNORMAL HIGH (ref 70–99)
Potassium: 4.9 mmol/L (ref 3.5–5.1)
Sodium: 140 mmol/L (ref 135–145)
Total Bilirubin: 2.1 mg/dL — ABNORMAL HIGH (ref 0.3–1.2)
Total Protein: 7.6 g/dL (ref 6.5–8.1)

## 2019-01-29 LAB — CBC WITH DIFFERENTIAL/PLATELET
Abs Immature Granulocytes: 0.03 10*3/uL (ref 0.00–0.07)
Basophils Absolute: 0.1 10*3/uL (ref 0.0–0.1)
Basophils Relative: 1 %
Eosinophils Absolute: 0.1 10*3/uL (ref 0.0–0.5)
Eosinophils Relative: 2 %
HCT: 46.1 % (ref 39.0–52.0)
Hemoglobin: 16.4 g/dL (ref 13.0–17.0)
Immature Granulocytes: 0 %
Lymphocytes Relative: 20 %
Lymphs Abs: 1.4 10*3/uL (ref 0.7–4.0)
MCH: 32.3 pg (ref 26.0–34.0)
MCHC: 35.6 g/dL (ref 30.0–36.0)
MCV: 90.7 fL (ref 80.0–100.0)
Monocytes Absolute: 0.5 10*3/uL (ref 0.1–1.0)
Monocytes Relative: 7 %
Neutro Abs: 5.1 10*3/uL (ref 1.7–7.7)
Neutrophils Relative %: 70 %
Platelets: 182 10*3/uL (ref 150–400)
RBC: 5.08 MIL/uL (ref 4.22–5.81)
RDW: 13.8 % (ref 11.5–15.5)
WBC: 7.2 10*3/uL (ref 4.0–10.5)
nRBC: 0 % (ref 0.0–0.2)

## 2019-01-29 LAB — IRON AND TIBC
Iron: 209 ug/dL — ABNORMAL HIGH (ref 42–163)
Saturation Ratios: 69 % — ABNORMAL HIGH (ref 20–55)
TIBC: 303 ug/dL (ref 202–409)
UIBC: 93 ug/dL — ABNORMAL LOW (ref 117–376)

## 2019-01-29 LAB — FERRITIN: Ferritin: 95 ng/mL (ref 24–336)

## 2019-01-29 NOTE — Telephone Encounter (Signed)
Scheduled appt per 5/18 los.  Left a VM of scheduled appt date and time.

## 2019-01-31 ENCOUNTER — Telehealth: Payer: Self-pay | Admitting: *Deleted

## 2019-01-31 NOTE — Telephone Encounter (Signed)
Contacted patient per Dr. Grier Mitts directions: No phlebotomy needed next week based on current labs. He is to return in 3 months for lab and appt with Dr. Irene Limbo. Encouraged to contact office for questions or concerns. Patient verbalized understanding.

## 2019-02-02 ENCOUNTER — Telehealth: Payer: Self-pay | Admitting: Hematology

## 2019-02-02 LAB — HEMOCHROMATOSIS DNA-PCR(C282Y,H63D)

## 2019-02-02 NOTE — Telephone Encounter (Signed)
Scheduled appt per 5/20 sch message - unable to reach patient . Left message with appt date and time and sent reminder letter in the mail

## 2019-03-06 DIAGNOSIS — L570 Actinic keratosis: Secondary | ICD-10-CM | POA: Diagnosis not present

## 2019-03-06 DIAGNOSIS — L814 Other melanin hyperpigmentation: Secondary | ICD-10-CM | POA: Diagnosis not present

## 2019-03-06 DIAGNOSIS — L819 Disorder of pigmentation, unspecified: Secondary | ICD-10-CM | POA: Diagnosis not present

## 2019-03-06 DIAGNOSIS — D2262 Melanocytic nevi of left upper limb, including shoulder: Secondary | ICD-10-CM | POA: Diagnosis not present

## 2019-03-06 DIAGNOSIS — L821 Other seborrheic keratosis: Secondary | ICD-10-CM | POA: Diagnosis not present

## 2019-03-06 DIAGNOSIS — D2261 Melanocytic nevi of right upper limb, including shoulder: Secondary | ICD-10-CM | POA: Diagnosis not present

## 2019-03-06 DIAGNOSIS — D225 Melanocytic nevi of trunk: Secondary | ICD-10-CM | POA: Diagnosis not present

## 2019-05-02 ENCOUNTER — Telehealth: Payer: Self-pay

## 2019-05-02 ENCOUNTER — Other Ambulatory Visit: Payer: Self-pay | Admitting: *Deleted

## 2019-05-02 NOTE — Telephone Encounter (Signed)
Left vm for pt regarding prescreening questions for appointment on 8/20. ° °

## 2019-05-02 NOTE — Progress Notes (Signed)
HEMATOLOGY/ONCOLOGY CONSULTATION NOTE  Date of Service: 05/02/2019  Patient Care Team: Lujean Amel, MD as PCP - General (Family Medicine) Sueanne Margarita, MD as PCP - Cardiology (Cardiology) [name], MD as Consulting Physician (Oncology) Wilford Corner, MD as Consulting Physician (Gastroenterology) Sueanne Margarita, MD as Consulting Physician (Cardiology) [name], MD as Consulting Physician (Oncology)  CHIEF COMPLAINTS/PURPOSE OF CONSULTATION:  Hereditary Hemochromatosis  HISTORY OF PRESENTING ILLNESS:   Alexander Pollard is a wonderful 68 y.o. male who has been referred to [place] by Dr. Murriel Hopper for evaluation and management of Hereditary Hemochromatosis. The pt reports that he is doing well overall.   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 Initial workup was significatn for mild liver function abnormalities, serum iron at 264 with a 97% saturation and a Ferritin of 1498. A 01/16/14 CT A/P was obtained which revealed overall normal appearance of the liver with splenomegaly of 16cm, which was then seen to decrease to 13cm on 05/28/14 US Abdomen, and fibrosis noted. He began regular phlebotomies in September 2015, but then due to a "secretarial error," was lost to follow up between January 2017 and October 2019, and subsequently resumed regular phlebotomies in October 2019.  The pt reports that he had elevated liver functions for a few years prior to his initial diagnosis in April 2015. He denies family history of early heart or liver disease. The pt's brother also has hereditary hemochromatosis.  The pt notes that his last phlebotomy was in January of February. He denies any problems tolerating his therapeutic phlebotomies. He notes that he did not experience a worsened symptomatic difference in the 2.5 year interim in which he was lost to follow up.  Most recent lab results: 10/31/18 HGB  at 15.7 09/05/18 Ferritin at 156  On review of systems, pt reports good energy levels, staying active, eating well,  and denies joint pains or swellings, abdominal pains, leg swelling, and any other symptoms.   On PMHx the pt reports appendectomy in May 2019 denies liver or heart disease. On Family Hx the pt reports brother with hereditary hemochromatosis  Interval History  Alexander Pollard is a 68 y.o. male presenting today to the management and evaluation of hereditary hemochromatosis. The patient's last visit with [place] was on 01/29/2019. The pt reports that he is doing well overall.   The pt reports he hasn't followed a specific diet. He has been feeling fine, with no major changes. Has not had a phlebotomy in about a year. Has not had to see a liver specialist. Has not had skin heath concerns since his Dx of hemochromatosis.  Lab results today (05/03/2019) of CBC w/diff and CMP is as follows: all values are WNL except for Calcium at 8.8 and Total Bilirubin at 1.9. 05/03/19 Ferritin 129 05/03/2019 Iron saturation is 35% saturation   On review of systems, pt reports additional brusing and denies abdominal pain and any other symptoms.    MEDICAL HISTORY:  Past Medical History:  Diagnosis Date  . Dilated aortic root ([place])   . Dyslipidemia    a. h/o low HDL.  . Enlarged aorta ([place])   . Family history of early CAD   . 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 Marked hypertensive response to exercise by ETT 06/2016 prompting addition of Toprol. b. F/u amb BP monitor  normal.    SURGICAL HISTORY: Past Surgical History:  Procedure Laterality Date  . COLONOSCOPY     next due 08/2017  . IR THORACENTESIS ASP PLEURAL SPACE W/IMG GUIDE  01/16/2018  . IR THORACENTESIS ASP PLEURAL SPACE W/IMG GUIDE  01/18/2018  . LAPAROTOMY N/A 01/09/2018   Procedure: EXPLORATORY LAPAROTOMY, RIGHT COLECTOMY, PLACEMENT  OF WOUND VAC TO ABDOMEN, DRAINAGE OF RETROPERITONEAL ABSCESS;  Surgeon: Rolm Bookbinder, MD;  Location: [facility];  Service: General;  Laterality: N/A;  . LAPAROTOMY N/A 01/17/2018   Procedure: EXPLORATORY LAPAROTOMY;  Surgeon: Donnie Mesa, MD;  Location: [facility] [facility];  Service: General;  Laterality: N/A;    SOCIAL HISTORY: Social History   Socioeconomic History  . Marital status: Divorced    Spouse name: Not on file  . Number of children: Not on file  . Years of education: Not on file  . Highest education level: Not on file  Occupational History  . Not on file  Social Needs  . Financial resource strain: Not on file  . Food insecurity    Worry: Not on file    Inability: Not on file  . Transportation needs    Medical: Not on file    Non-medical: Not on file  Tobacco Use  . Smoking status: Former Research scientist (life sciences)  . Smokeless tobacco: Never Used  Substance and Sexual Activity  . Alcohol use: Yes    Alcohol/week: 0.0 standard drinks    Comment: Beer weekly.  . Drug use: No  . Sexual activity: Not on file  Lifestyle  . Physical activity    Days per week: Not on file    Minutes per session: Not on file  . Stress: Not on file  Relationships  . Social Herbalist on phone: Not on file    Gets together: Not on file    Attends religious service: Not on file    Active member of club or organization: Not on file    Attends meetings of clubs or organizations: Not on file    Relationship status: Not on file  . Intimate partner violence    Fear of current or ex partner: Not on file    Emotionally abused: Not on file    Physically abused: Not on file    Forced sexual activity: Not on file  Other Topics Concern  . Not on file  Social History Narrative  . Not on file    FAMILY HISTORY: Family History  Problem Relation Age of Onset  . Heart attack Mother   . Heart disease Mother   . Leukemia Brother   . Lymphoma Brother   . Heart disease Brother     ALLERGIES:  has No Known  Allergies.  MEDICATIONS:  Current Outpatient Medications  Medication Sig Dispense Refill  . acetaminophen (TYLENOL) 325 MG tablet Take 2 tablets (650 mg total) by mouth every 6 (six) hours as needed.    Marland Kitchen aspirin EC 81 MG tablet Take 81 mg by mouth daily.    . metoprolol succinate (TOPROL-XL) 25 MG 24 hr tablet TAKE 1 TABLET BY MOUTH EVERY DAY 90 tablet 3   No current facility-administered medications for this visit.     REVIEW OF SYSTEMS:    A 10+ POINT REVIEW OF SYSTEMS WAS OBTAINED including neurology, dermatology, psychiatry, cardiac, respiratory, lymph, extremities, GI, GU, Musculoskeletal, constitutional, breasts, reproductive, HEENT.  All pertinent positives are noted in the HPI.  All others are negative.   PHYSICAL EXAMINATION:  . Vitals:  05/03/19 1323  BP: 130/87  Pulse: 72  Resp: 18  Temp: 99.5 F (37.5 C)  SpO2: 98%   Filed Weights   05/03/19 1323  Weight: 194 lb 3.2 oz (88.1 kg)   .Body mass index is 29.53 kg/m.   GENERAL:alert, in no acute distress and comfortable SKIN: no acute rashes, no significant lesions EYES: conjunctiva are pink and non-injected, sclera anicteric OROPHARYNX: MMM, no exudates, no oropharyngeal erythema or ulceration NECK: supple, no JVD LYMPH:  no palpable lymphadenopathy in the cervical, axillary or inguinal regions LUNGS: clear to auscultation b/l with normal respiratory effort HEART: regular rate & rhythm ABDOMEN:  normoactive bowel sounds , non tender, not distended. No palpable hepatomegaly. Just palpable spleen. 1-2 finger breadths below rib cage. Extremity: no pedal edema PSYCH: alert & oriented x 3 with fluent speech NEURO: no focal motor/sensory deficits   LABORATORY DATA:  I have reviewed the data as listed  . CBC Latest Ref Rng & Units 05/03/2019 01/29/2019 10/31/2018  WBC 4.0 - 10.5 K/uL 7.6 7.2 -  Hemoglobin 13.0 - 17.0 g/dL 16.0 16.4 15.7  Hematocrit 39.0 - 52.0 % 44.6 46.1 -  Platelets 150 - 400 K/uL 188 182 -     . CMP Latest Ref Rng & Units 05/03/2019 01/29/2019 09/05/2018  Glucose 70 - 99 mg/dL 99 125(H) 144(H)  BUN 8 - 23 mg/dL 14 10 14   Creatinine 0.61 - 1.24 mg/dL 1.05 1.08 0.90  Sodium 135 - 145 mmol/L 140 140 137  Potassium 3.5 - 5.1 mmol/L 4.4 4.9 4.0  Chloride 98 - 111 mmol/L 107 106 108  CO2 22 - 32 mmol/L 24 26 22   Calcium 8.9 - 10.3 mg/dL 8.8(L) 9.4 9.4  Total Protein 6.5 - 8.1 g/dL 7.0 7.6 6.7  Total Bilirubin 0.3 - 1.2 mg/dL 1.9(H) 2.1(H) 1.4(H)  Alkaline Phos 38 - 126 U/L 80 79 67  AST 15 - 41 U/L 21 22 24   ALT 0 - 44 U/L 22 22 25    . Lab Results  Component Value Date   IRON 234 (H) 05/03/2019   TIBC 269 05/03/2019   IRONPCTSAT 87 (H) 05/03/2019   (Iron and TIBC)  Lab Results  Component Value Date   FERRITIN 129 05/03/2019      RADIOGRAPHIC STUDIES: I have personally reviewed the radiological images as listed and agreed with the findings in the report. No results found.  ASSESSMENT & PLAN:  68 y.o. male with  1. Hereditary Hemochromatosis April 2015 homozygous C282Y mutation  April 2015 Ferritin at 1498 upon initial diagnosis 01/15/18 CT A/P revealed splenomegaly with spleen at 15.6cm Pt denies concern for heart of liver disease --01/29/2019 Hemochromatosis DNA revealed Homozygous C282Y  PLAN: -Discussed pt labwork today, 05/03/2019; no abnormalities found. -Discussed liver enzymes were normal on 01/29/2019.  -Discussed downward trend of Ferritin, which was at 95 on 01/29/2019.  -Discussed positive Homozygous C282Y test results -Goal for Ferritin between 50 and 100 -Recommend that patient's children consider genetic testing to rule out hereditary hemochromatosis -Discussed again that alcohol and red meat consumption can increase ferritin levels and increase frequency of the necessity for phlebotomy -Will schedule [place] to look at liver before next appointment in 6 months. May refer to Hepatologist.  -Will plan for phlebotomy every 6 months if lab count  trends continue.  -Set up phlebotomy in a week, contingent upon lab results    -Therapeutic phebotomy  in 1 week (patient prefers to have this at [facility] day center) -[place] elastography in 24  weeks -RTC with Dr Irene Limbo with labs in 6 months   All of the patients questions were answered with apparent satisfaction. The patient knows to call the clinic with any problems, questions or concerns.  The total time spent in the appt was 20 minutes and more than 50% was on counseling and direct patient cares.   Sullivan Lone MD MS AAHIVMS [facility] Hematology/Oncology Physician [facility]  (Office):       [phone] (Work cell):  [phone] (Fax):           [phone]  05/02/2019 8:53 AM  I, De Burrs, am acting as a scribe for Dr. Irene Limbo  .I have reviewed the above documentation for accuracy and completeness, and I agree with the above. Brunetta Genera MD

## 2019-05-03 ENCOUNTER — Inpatient Hospital Stay: Payer: PPO

## 2019-05-03 ENCOUNTER — Other Ambulatory Visit: Payer: Self-pay

## 2019-05-03 ENCOUNTER — Inpatient Hospital Stay: Payer: PPO | Attending: Hematology | Admitting: Hematology

## 2019-05-03 ENCOUNTER — Telehealth: Payer: Self-pay | Admitting: Hematology

## 2019-05-03 DIAGNOSIS — E785 Hyperlipidemia, unspecified: Secondary | ICD-10-CM | POA: Insufficient documentation

## 2019-05-03 DIAGNOSIS — Z9049 Acquired absence of other specified parts of digestive tract: Secondary | ICD-10-CM | POA: Insufficient documentation

## 2019-05-03 DIAGNOSIS — Z7982 Long term (current) use of aspirin: Secondary | ICD-10-CM | POA: Insufficient documentation

## 2019-05-03 DIAGNOSIS — Z87891 Personal history of nicotine dependence: Secondary | ICD-10-CM | POA: Insufficient documentation

## 2019-05-03 DIAGNOSIS — I10 Essential (primary) hypertension: Secondary | ICD-10-CM | POA: Diagnosis not present

## 2019-05-03 DIAGNOSIS — Z79899 Other long term (current) drug therapy: Secondary | ICD-10-CM | POA: Diagnosis not present

## 2019-05-03 DIAGNOSIS — R161 Splenomegaly, not elsewhere classified: Secondary | ICD-10-CM | POA: Diagnosis not present

## 2019-05-03 LAB — CMP (CANCER CENTER ONLY)
ALT: 22 U/L (ref 0–44)
AST: 21 U/L (ref 15–41)
Albumin: 4.2 g/dL (ref 3.5–5.0)
Alkaline Phosphatase: 80 U/L (ref 38–126)
Anion gap: 9 (ref 5–15)
BUN: 14 mg/dL (ref 8–23)
CO2: 24 mmol/L (ref 22–32)
Calcium: 8.8 mg/dL — ABNORMAL LOW (ref 8.9–10.3)
Chloride: 107 mmol/L (ref 98–111)
Creatinine: 1.05 mg/dL (ref 0.61–1.24)
GFR, Est AFR Am: >60 mL/min (ref >60)
GFR, Estimated: >60 mL/min (ref >60)
Glucose, Bld: 99 mg/dL (ref 70–99)
Potassium: 4.4 mmol/L (ref 3.5–5.1)
Sodium: 140 mmol/L (ref 135–145)
Total Bilirubin: 1.9 mg/dL — ABNORMAL HIGH (ref 0.3–1.2)
Total Protein: 7 g/dL (ref 6.5–8.1)

## 2019-05-03 LAB — CBC WITH DIFFERENTIAL (CANCER CENTER ONLY)
Abs Immature Granulocytes: 0.03 10*3/uL (ref 0.00–0.07)
Basophils Absolute: 0 10*3/uL (ref 0.0–0.1)
Basophils Relative: 1 %
Eosinophils Absolute: 0.2 10*3/uL (ref 0.0–0.5)
Eosinophils Relative: 2 %
HCT: 44.6 % (ref 39.0–52.0)
Hemoglobin: 16 g/dL (ref 13.0–17.0)
Immature Granulocytes: 0 %
Lymphocytes Relative: 27 %
Lymphs Abs: 2.1 10*3/uL (ref 0.7–4.0)
MCH: 32.9 pg (ref 26.0–34.0)
MCHC: 35.9 g/dL (ref 30.0–36.0)
MCV: 91.6 fL (ref 80.0–100.0)
Monocytes Absolute: 0.6 10*3/uL (ref 0.1–1.0)
Monocytes Relative: 7 %
Neutro Abs: 4.7 10*3/uL (ref 1.7–7.7)
Neutrophils Relative %: 63 %
Platelet Count: 188 10*3/uL (ref 150–400)
RBC: 4.87 MIL/uL (ref 4.22–5.81)
RDW: 13.6 % (ref 11.5–15.5)
WBC Count: 7.6 10*3/uL (ref 4.0–10.5)
nRBC: 0 % (ref 0.0–0.2)

## 2019-05-03 LAB — IRON AND TIBC
Iron: 234 ug/dL — ABNORMAL HIGH (ref 42–163)
Saturation Ratios: 87 % — ABNORMAL HIGH (ref 20–55)
TIBC: 269 ug/dL (ref 202–409)
UIBC: 35 ug/dL — ABNORMAL LOW (ref 117–376)

## 2019-05-03 LAB — FERRITIN: Ferritin: 129 ng/mL (ref 24–336)

## 2019-05-03 NOTE — Telephone Encounter (Signed)
Scheduled appt per 8/20 los,  Left a voice message of appt date and time.

## 2019-05-09 ENCOUNTER — Telehealth: Payer: Self-pay | Admitting: *Deleted

## 2019-05-09 NOTE — Telephone Encounter (Signed)
Verified patient appt for phlebotomy at [place] - Tuesday 9/1 @ 8 am with day center staff and patient.

## 2019-05-14 ENCOUNTER — Other Ambulatory Visit (HOSPITAL_COMMUNITY): Payer: Self-pay | Admitting: *Deleted

## 2019-05-15 ENCOUNTER — Other Ambulatory Visit: Payer: Self-pay

## 2019-05-15 ENCOUNTER — Ambulatory Visit (HOSPITAL_COMMUNITY)
Admission: RE | Admit: 2019-05-15 | Discharge: 2019-05-15 | Disposition: A | Discharge status: Home / Self Care | Payer: PPO | Source: Ambulatory Visit | Attending: Hematology | Admitting: Hematology

## 2019-05-15 LAB — POCT HEMOGLOBIN-HEMACUE: Hemoglobin: 14.3 g/dL (ref 13.0–17.0)

## 2019-05-15 NOTE — Progress Notes (Signed)
Therapeutic phlebotomy performed via l a/c with 18g needle.  500cc blood obtained without difficulty.  Pt tolerated procedure with no complaints

## 2019-06-18 ENCOUNTER — Telehealth: Payer: Self-pay | Admitting: *Deleted

## 2019-06-18 NOTE — Telephone Encounter (Signed)
Faxed order signed by Dr.Kale for Phlebotomy to Tanna Furry  [facility] [phone]. Fax confirmation recv'd

## 2019-06-19 DIAGNOSIS — Z23 Encounter for immunization: Secondary | ICD-10-CM | POA: Diagnosis not present

## 2019-07-17 DIAGNOSIS — E663 Overweight: Secondary | ICD-10-CM | POA: Diagnosis not present

## 2019-07-17 DIAGNOSIS — M2550 Pain in unspecified joint: Secondary | ICD-10-CM | POA: Diagnosis not present

## 2019-07-17 DIAGNOSIS — Z6829 Body mass index (BMI) 29.0-29.9, adult: Secondary | ICD-10-CM | POA: Diagnosis not present

## 2019-08-01 NOTE — Progress Notes (Signed)
Cardiology Office Note:    Date:  08/02/2019   ID:  Alexander Pollard, DOB Aug 17, 1951, MRN [id number]  PCP:  Lujean Amel, MD  Cardiologist:  Fransico Him, MD    Referring MD: Lujean Amel, MD   Chief Complaint  Patient presents with  . Hypertension  . Hyperlipidemia    History of Present Illness:    Alexander Pollard is a 68 y.o. male with a hx of hemachromatosis,mildly dilated aortic root, diastolic dysfunction and family history of CAD. He underwent ETT 06/2015 due to atypical chest pain - had hypertensive response to exerciseup to 195/107, prompting addition of Toprol. Nuc 07/2015 was normal, EF 60%. Ambulatory BP monitor 07/2015 was normal.  2D echo 04/10/2018 normal LV function with mild grade 1 DD and mildly dilated ascending aorta.  He is here today for followup and is doing well.  He denies any chest pain or pressure, SOB, DOE, PND, orthopnea, LE edema, dizziness, palpitations or syncope. He is compliant with his meds and is tolerating meds with no SE.    Past Medical History:  Diagnosis Date  . Dilated aortic root ([place])   . Dyslipidemia    a. h/o low HDL.  . Enlarged aorta ([place])   . Family history of early CAD   . 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 Marked hypertensive response to exercise by ETT 06/2016 prompting addition of Toprol. b. F/u amb BP monitor normal.    Past Surgical History:  Procedure Laterality Date  . COLONOSCOPY     next due 08/2017  . IR THORACENTESIS ASP PLEURAL SPACE W/IMG GUIDE  01/16/2018  . IR THORACENTESIS ASP PLEURAL SPACE W/IMG GUIDE  01/18/2018  . LAPAROTOMY N/A 01/09/2018   Procedure: EXPLORATORY LAPAROTOMY, RIGHT COLECTOMY, PLACEMENT OF WOUND VAC TO ABDOMEN, DRAINAGE OF RETROPERITONEAL ABSCESS;  Surgeon: Rolm Bookbinder, MD;  Location: [place];  Service: General;  Laterality: N/A;  . LAPAROTOMY N/A 01/17/2018   Procedure: EXPLORATORY  LAPAROTOMY;  Surgeon: Donnie Mesa, MD;  Location: [place];  Service: General;  Laterality: N/A;    Current Medications: Current Meds  Medication Sig  . aspirin EC 81 MG tablet Take 81 mg by mouth daily.  Marland Kitchen ibuprofen (ADVIL) 200 MG tablet Take 600 mg by mouth daily.  . metoprolol succinate (TOPROL-XL) 25 MG 24 hr tablet TAKE 1 TABLET BY MOUTH EVERY DAY     Allergies:   Patient has no known allergies.   Social History   Socioeconomic History  . Marital status: Divorced    Spouse name: Not on file  . Number of children: Not on file  . Years of education: Not on file  . Highest education level: Not on file  Occupational History  . Not on file  Social Needs  . Financial resource strain: Not on file  . Food insecurity    Worry: Not on file    Inability: Not on file  . Transportation needs    Medical: Not on file    Non-medical: Not on file  Tobacco Use  . Smoking status: Former Research scientist (life sciences)  . Smokeless tobacco: Never Used  Substance and Sexual Activity  . Alcohol use: Yes    Alcohol/week: 0.0 standard drinks    Comment: Beer weekly.  . Drug use: No  . Sexual activity: Not on file  Lifestyle  . Physical activity    Days per week: Not on file  Minutes per session: Not on file  . Stress: Not on file  Relationships  . Social Herbalist on phone: Not on file    Gets together: Not on file    Attends religious service: Not on file    Active member of club or organization: Not on file    Attends meetings of clubs or organizations: Not on file    Relationship status: Not on file  Other Topics Concern  . Not on file  Social History Narrative  . Not on file     Family History: The patient's family history includes Heart attack in his mother; Heart disease in his brother and mother; Leukemia in his brother; Lymphoma in his brother.  ROS:   Please see the history of present illness.    ROS  All other systems reviewed and negative.   EKGs/Labs/Other Studies  Reviewed:    The following studies were reviewed today: none  EKG:  EKG is  ordered today.  The ekg ordered today demonstrates NSR at 76bpm with no ST changes  Recent Labs: 05/03/2019: ALT 22; BUN 14; Creatinine 1.05; Platelet Count 188; Potassium 4.4; Sodium 140 05/15/2019: Hemoglobin 14.3   Recent Lipid Panel No results found for: CHOL, TRIG, HDL, CHOLHDL, VLDL, LDLCALC, LDLDIRECT  Physical Exam:    VS:  BP 128/84   Pulse 76   Ht 5\' 8"  (1.727 m)   Wt 186 lb 9.6 oz (84.6 kg)   SpO2 97%   BMI 28.37 kg/m     Wt Readings from Last 3 Encounters:  08/02/19 186 lb 9.6 oz (84.6 kg)  05/15/19 186 lb (84.4 kg)  05/03/19 194 lb 3.2 oz (88.1 kg)     GEN:  Well nourished, well developed in no acute distress HEENT: Normal NECK: No JVD; No carotid bruits LYMPHATICS: No lymphadenopathy CARDIAC: RRR, no murmurs, rubs, gallops RESPIRATORY:  Clear to auscultation without rales, wheezing or rhonchi  ABDOMEN: Soft, non-tender, non-distended MUSCULOSKELETAL:  No edema; No deformity  SKIN: Warm and dry NEUROLOGIC:  Alert and oriented x 3 PSYCHIATRIC:  Normal affect   ASSESSMENT:    1. Essential hypertension   2. Dyslipidemia   3. Family history of early CAD   15. Acquired dilation of ascending aorta and aortic root ([place])   5. Hereditary hemochromatosis ([place])    PLAN:    In order of problems listed above:  1.  HTN -BP controlled on exam -continue on Toprol XL 25mg  daily  2.  HLD -followed by PCP -continue diet control -LDL was 48 a year ago  3.  Fm Hx of premature CAD -ETT in 2016 with no ischemia -he has not had any angina  4.  Dilated ascending aorta -2D echo 2019 mildly enlarged -repeat echo to reassess aorta -encouraged him to avoid upper body weight lifting  -encouraged him to do aerobic exercise  5.  Hereditary hemochromatosis -followed by Dr. Beryle Beams and managed with periodic phlebotomies   Medication Adjustments/Labs and Tests Ordered: Current medicines  are reviewed at length with the patient today.  Concerns regarding medicines are outlined above.  Orders Placed This Encounter  Procedures  . EKG 12-Lead   No orders of the defined types were placed in this encounter.   Signed, Fransico Him, MD  08/02/2019 3:39 PM    [place]

## 2019-08-02 ENCOUNTER — Encounter: Payer: Self-pay | Admitting: Cardiology

## 2019-08-02 ENCOUNTER — Other Ambulatory Visit: Payer: Self-pay

## 2019-08-02 ENCOUNTER — Ambulatory Visit (INDEPENDENT_AMBULATORY_CARE_PROVIDER_SITE_OTHER): Payer: PPO | Admitting: Cardiology

## 2019-08-02 VITALS — BP 128/84 | HR 76 | Ht 68.0 in | Wt 186.6 lb

## 2019-08-02 DIAGNOSIS — I10 Essential (primary) hypertension: Secondary | ICD-10-CM

## 2019-08-02 DIAGNOSIS — E785 Hyperlipidemia, unspecified: Secondary | ICD-10-CM | POA: Diagnosis not present

## 2019-08-02 DIAGNOSIS — I77819 Aortic ectasia, unspecified site: Secondary | ICD-10-CM | POA: Diagnosis not present

## 2019-08-02 DIAGNOSIS — Z8249 Family history of ischemic heart disease and other diseases of the circulatory system: Secondary | ICD-10-CM | POA: Diagnosis not present

## 2019-08-02 NOTE — Patient Instructions (Addendum)
Medication Instructions:  Your physician recommends that you continue on your current medications as directed. Please refer to the Current Medication list given to you today.  *If you need a refill on your cardiac medications before your next appointment, please call your pharmacy*  Testing/Procedures: Your physician has requested that you have an echocardiogram on Monday, November 23rd at 2:50PM. Echocardiography is a painless test that uses sound waves to create images of your heart. It provides your doctor with information about the size and shape of your heart and how well your heart's chambers and valves are working. This procedure takes approximately one hour. There are no restrictions for this procedure.  Follow-Up: At [facility], you and your health needs are our priority.  As part of our continuing mission to provide you with exceptional heart care, we have created designated Provider Care Teams.  These Care Teams include your primary Cardiologist (physician) and Advanced Practice Providers (APPs -  Physician Assistants and Nurse Practitioners) who all work together to provide you with the care you need, when you need it.  Your next appointment:   1 year(s)  The format for your next appointment:   In Person  Provider:   You may see Fransico Him, MD or one of the following Advanced Practice Providers on your designated Care Team:    Melina Copa, PA-C  Ermalinda Barrios, PA-C

## 2019-08-06 ENCOUNTER — Ambulatory Visit (HOSPITAL_COMMUNITY): Payer: PPO | Attending: Cardiovascular Disease

## 2019-08-06 ENCOUNTER — Other Ambulatory Visit: Payer: Self-pay

## 2019-08-06 DIAGNOSIS — Z8249 Family history of ischemic heart disease and other diseases of the circulatory system: Secondary | ICD-10-CM | POA: Insufficient documentation

## 2019-08-06 DIAGNOSIS — E785 Hyperlipidemia, unspecified: Secondary | ICD-10-CM | POA: Insufficient documentation

## 2019-08-06 DIAGNOSIS — I77819 Aortic ectasia, unspecified site: Secondary | ICD-10-CM | POA: Diagnosis not present

## 2019-08-06 DIAGNOSIS — I77810 Thoracic aortic ectasia: Secondary | ICD-10-CM | POA: Diagnosis not present

## 2019-08-06 DIAGNOSIS — Z87891 Personal history of nicotine dependence: Secondary | ICD-10-CM | POA: Insufficient documentation

## 2019-08-07 ENCOUNTER — Telehealth: Payer: Self-pay | Admitting: *Deleted

## 2019-08-07 DIAGNOSIS — Z01812 Encounter for preprocedural laboratory examination: Secondary | ICD-10-CM

## 2019-08-07 DIAGNOSIS — I77819 Aortic ectasia, unspecified site: Secondary | ICD-10-CM

## 2019-08-07 NOTE — Telephone Encounter (Signed)
-----   Message from Sueanne Margarita, MD sent at 08/06/2019  7:35 PM EST ----- Echo showed normal LVF with increased stiffness of heart muscle, trivial TR and PR, mild to moderately dilated ascending aorta at 37mm.  Please get a Chest CTA to evaluate further his aortica aneurysm.   Not currently on statin.  Please get a copy of last FLP from PCP.

## 2019-08-07 NOTE — Telephone Encounter (Signed)
I spoke with pt and reviewed results/recommendations with him.  Last BMP was in August so he will come to office on 12/3 at 1:30 for BMP.    Will call PCP to get most recent lipid profile.  Pt reports his next physical is in February 2021.  He is aware CT department in our office will call him with appointment information.

## 2019-08-15 ENCOUNTER — Telehealth: Payer: Self-pay

## 2019-08-15 DIAGNOSIS — E785 Hyperlipidemia, unspecified: Secondary | ICD-10-CM

## 2019-08-15 NOTE — Telephone Encounter (Signed)
Patient is coming in on 12/03 to have BMP done, per Dr. Radford Pax he will also have FLP. Spoke with patient and made him aware that he will need to be fasting prior to the labs.

## 2019-08-16 ENCOUNTER — Telehealth: Payer: Self-pay

## 2019-08-16 ENCOUNTER — Other Ambulatory Visit: Payer: Self-pay

## 2019-08-16 ENCOUNTER — Other Ambulatory Visit: Payer: PPO | Admitting: *Deleted

## 2019-08-16 DIAGNOSIS — E785 Hyperlipidemia, unspecified: Secondary | ICD-10-CM | POA: Diagnosis not present

## 2019-08-16 DIAGNOSIS — Z01812 Encounter for preprocedural laboratory examination: Secondary | ICD-10-CM | POA: Diagnosis not present

## 2019-08-16 DIAGNOSIS — I77819 Aortic ectasia, unspecified site: Secondary | ICD-10-CM

## 2019-08-16 LAB — LIPID PANEL
Chol/HDL Ratio: 3.7 ratio (ref 0.0–5.0)
Cholesterol, Total: 103 mg/dL (ref 100–199)
HDL: 28 mg/dL — ABNORMAL LOW (ref >39)
LDL Chol Calc (NIH): 52 mg/dL (ref 0–99)
Triglycerides: 129 mg/dL (ref 0–149)
VLDL Cholesterol Cal: 23 mg/dL (ref 5–40)

## 2019-08-16 LAB — BASIC METABOLIC PANEL
BUN/Creatinine Ratio: 13 (ref 10–24)
BUN: 13 mg/dL (ref 8–27)
CO2: 24 mmol/L (ref 20–29)
Calcium: 9.2 mg/dL (ref 8.6–10.2)
Chloride: 105 mmol/L (ref 96–106)
Creatinine, Ser: 1.04 mg/dL (ref 0.76–1.27)
GFR calc Af Amer: 85 mL/min/{1.73_m2} (ref >59)
GFR calc non Af Amer: 73 mL/min/{1.73_m2} (ref >59)
Glucose: 118 mg/dL — ABNORMAL HIGH (ref 65–99)
Potassium: 4.8 mmol/L (ref 3.5–5.2)
Sodium: 140 mmol/L (ref 134–144)

## 2019-08-16 NOTE — Telephone Encounter (Signed)
Notes recorded by Frederik Schmidt, RN on 08/16/2019 at 4:02 PM EST  The patient has been notified of the result and verbalized understanding. All questions (if any) were answered.  Frederik Schmidt, RN 08/16/2019 4:02 PM

## 2019-08-16 NOTE — Telephone Encounter (Signed)
-----   Message from Sueanne Margarita, MD sent at 08/16/2019  3:52 PM EST ----- Stable labs - continue current meds and forward to PCP

## 2019-08-21 ENCOUNTER — Ambulatory Visit (INDEPENDENT_AMBULATORY_CARE_PROVIDER_SITE_OTHER)
Admission: RE | Admit: 2019-08-21 | Discharge: 2019-08-21 | Disposition: A | Discharge status: Home / Self Care | Payer: PPO | Source: Ambulatory Visit | Attending: Cardiology | Admitting: Cardiology

## 2019-08-21 ENCOUNTER — Other Ambulatory Visit: Payer: Self-pay

## 2019-08-21 DIAGNOSIS — I712 Thoracic aortic aneurysm, without rupture: Secondary | ICD-10-CM | POA: Diagnosis not present

## 2019-08-21 DIAGNOSIS — I77819 Aortic ectasia, unspecified site: Secondary | ICD-10-CM

## 2019-08-21 MED ORDER — IOHEXOL 350 MG/ML SOLN
100.0000 mL | Freq: Once | INTRAVENOUS | Status: AC | PRN
  Administered 2019-08-21: 100 mL via INTRAVENOUS

## 2019-08-22 ENCOUNTER — Telehealth: Payer: Self-pay

## 2019-08-22 ENCOUNTER — Encounter: Payer: Self-pay | Admitting: Cardiology

## 2019-08-22 DIAGNOSIS — I77819 Aortic ectasia, unspecified site: Secondary | ICD-10-CM

## 2019-08-22 NOTE — Telephone Encounter (Signed)
-----   Message from Sueanne Margarita, MD sent at 08/22/2019 11:31 AM EST ----- Chest CT showed a small ascending thoracic aortic aneurysm measuring 51mm with atherosclerosis.  Needs aggressive BP and lipid treatment.  LDL at goal and BP controlled at last OV.  Get 2D echo limited for aortic aneurysm followup 08/2020

## 2019-08-22 NOTE — Telephone Encounter (Signed)
The patient has been notified of the result and verbalized understanding.  All questions (if any) were answered.     

## 2019-08-29 ENCOUNTER — Other Ambulatory Visit: Payer: Self-pay | Admitting: Physician Assistant

## 2019-08-29 DIAGNOSIS — I10 Essential (primary) hypertension: Secondary | ICD-10-CM

## 2019-08-31 ENCOUNTER — Ambulatory Visit (HOSPITAL_COMMUNITY): Payer: PPO | Attending: Cardiology

## 2019-08-31 ENCOUNTER — Other Ambulatory Visit: Payer: Self-pay

## 2019-08-31 ENCOUNTER — Telehealth: Payer: Self-pay

## 2019-08-31 DIAGNOSIS — I77810 Thoracic aortic ectasia: Secondary | ICD-10-CM | POA: Insufficient documentation

## 2019-08-31 DIAGNOSIS — I77819 Aortic ectasia, unspecified site: Secondary | ICD-10-CM

## 2019-08-31 DIAGNOSIS — I080 Rheumatic disorders of both mitral and aortic valves: Secondary | ICD-10-CM | POA: Insufficient documentation

## 2019-08-31 NOTE — Telephone Encounter (Signed)
-----   Message from Sueanne Margarita, MD sent at 08/31/2019 12:09 PM EST ----- Echo showed normal LVF with increased stiffness of heart muscle, there is trivial leakiness of the TV and aortic valve which is normal.  The ascending aorta is mildly dilated at 4cm.  Please repeat limited echo in 1 year for dilated aorta.

## 2019-08-31 NOTE — Telephone Encounter (Signed)
The patient has been notified of the result and verbalized understanding.  All questions (if any) were answered. Order has been placed for limited echo.  Antonieta Iba, RN 08/31/2019 1:18 PM

## 2019-09-04 ENCOUNTER — Other Ambulatory Visit: Payer: Self-pay | Admitting: Family Medicine

## 2019-09-04 DIAGNOSIS — I714 Abdominal aortic aneurysm, without rupture, unspecified: Secondary | ICD-10-CM

## 2019-09-04 IMAGING — CT CT ABD-PELV W/ CM
2 of 5 series · 15 of 46 positions shown, 17 images · IV contrast (APPLIED)
Comparison: Abdominal ultrasound dated 05/28/2014

CLINICAL DATA: Right lower quadrant abdominal pain x3 days

EXAM:
CT ABDOMEN AND PELVIS WITH CONTRAST
TECHNIQUE: Multidetector CT imaging of the abdomen and pelvis was performed
using the standard protocol following bolus administration of
intravenous contrast.
CONTRAST:  100mL OMNIPAQUE IOHEXOL 300 MG/ML  SOLN

[Series 3: abd/ pelvis 5.0 i30f 2 · axial · 0.79mm/px · z∈[-525,-80]mm · 12 of 103 slices shown, 14 images]
[im 7/103  soft-tissue]
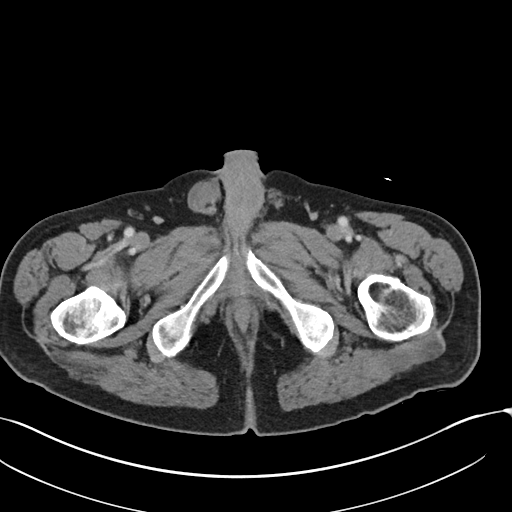
[im 7/103  bone]
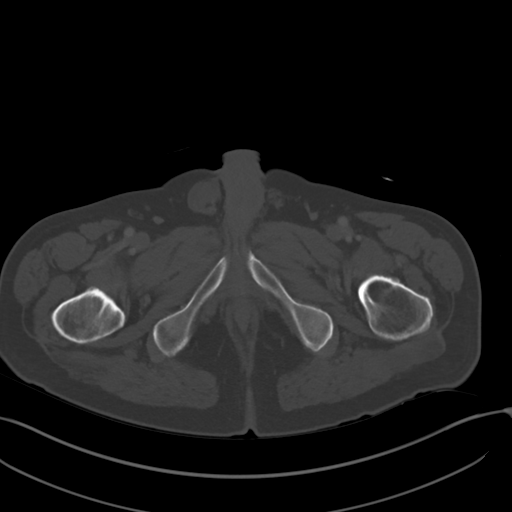
[im 13/103  soft-tissue]
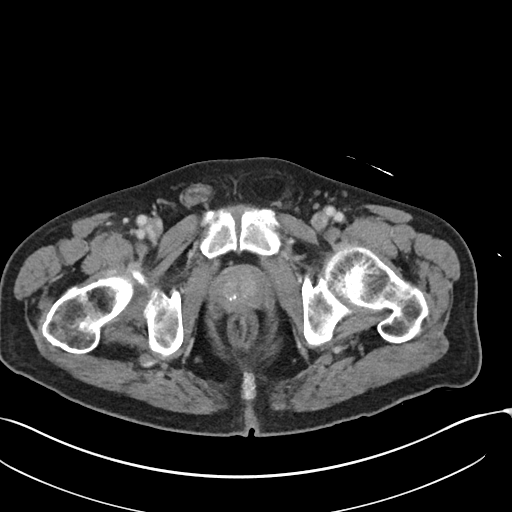
[im 26/103  soft-tissue]
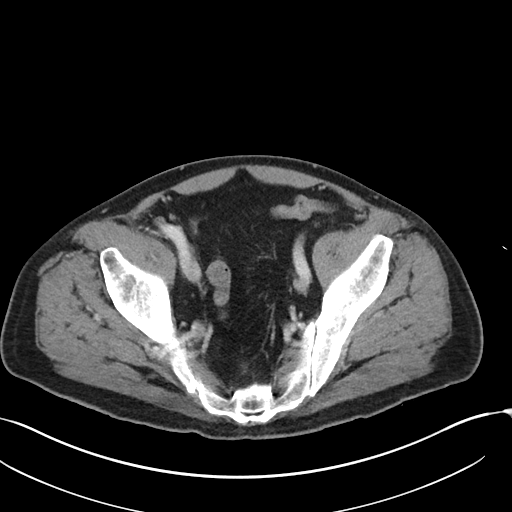
[im 32/103  soft-tissue]
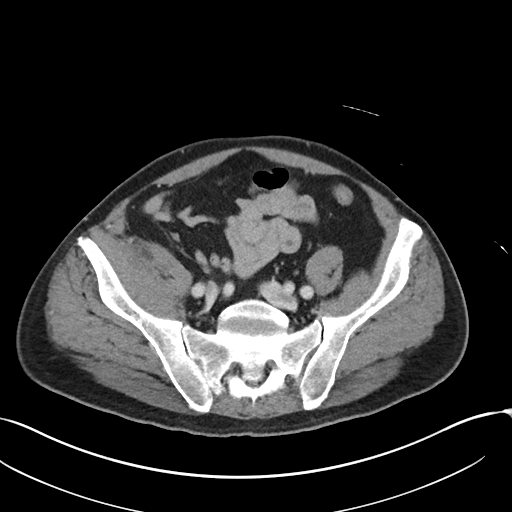
[im 39/103  soft-tissue]
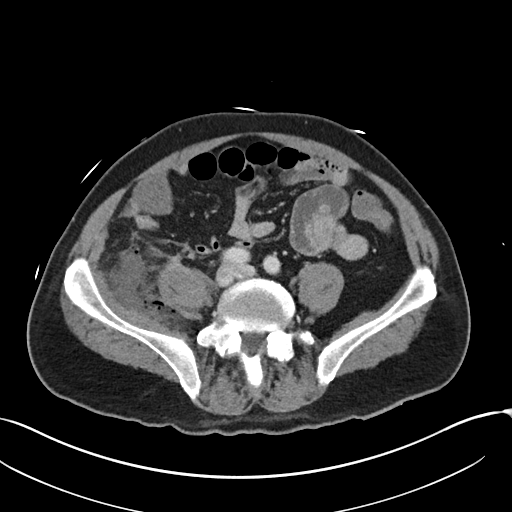
[im 45/103  soft-tissue]
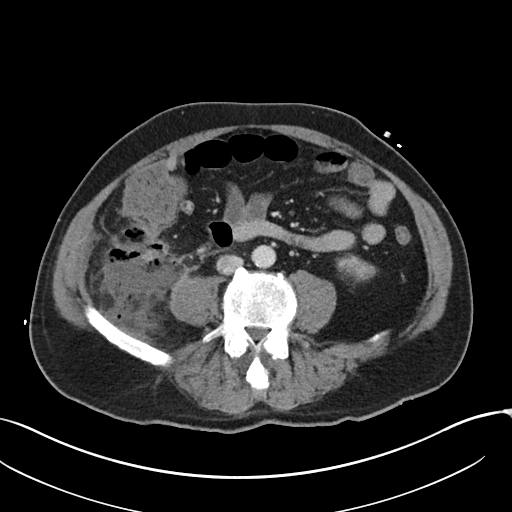
[im 58/103  soft-tissue]
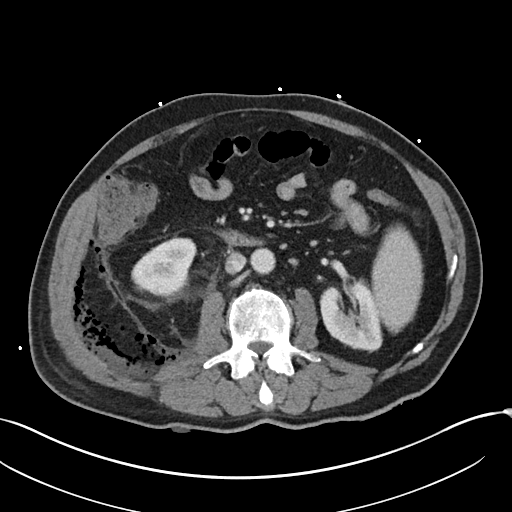
[im 64/103  soft-tissue]
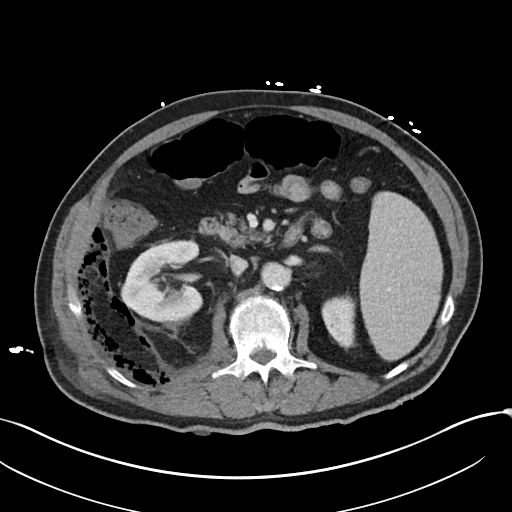
[im 71/103  soft-tissue]
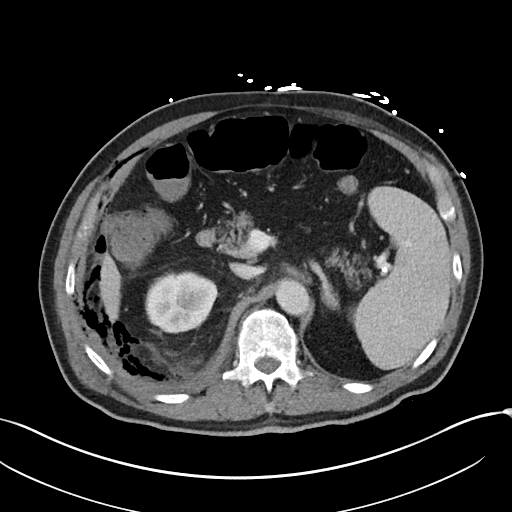
[im 71/103  bone]
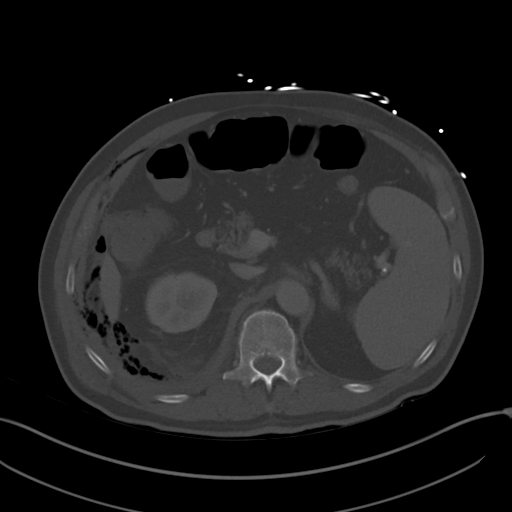
[im 77/103  soft-tissue]
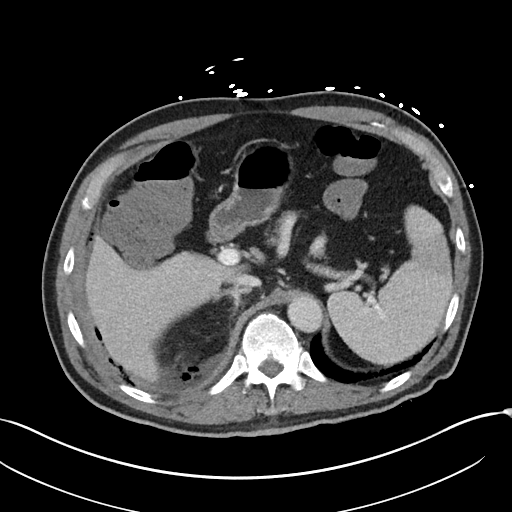
[im 90/103  soft-tissue]
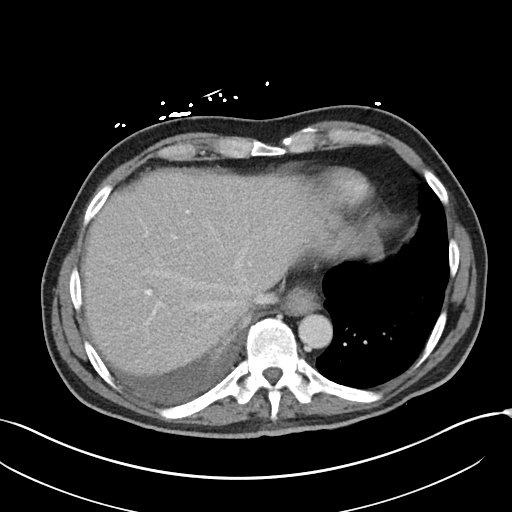
[im 96/103  soft-tissue]
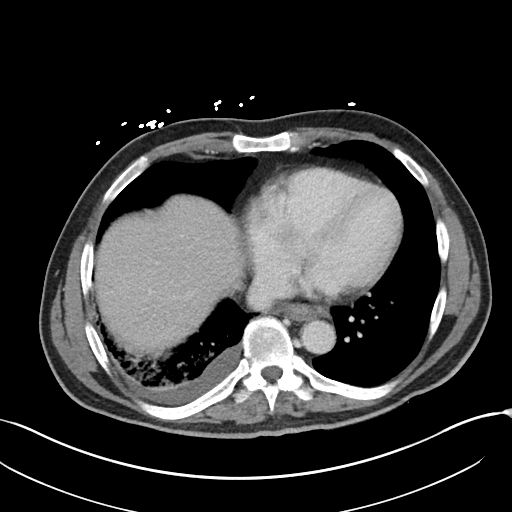

[Series 6: coronal soft tissue · coronal · 0.72mm/px · 3 of 88 slices shown]
[im 30/88  soft-tissue]
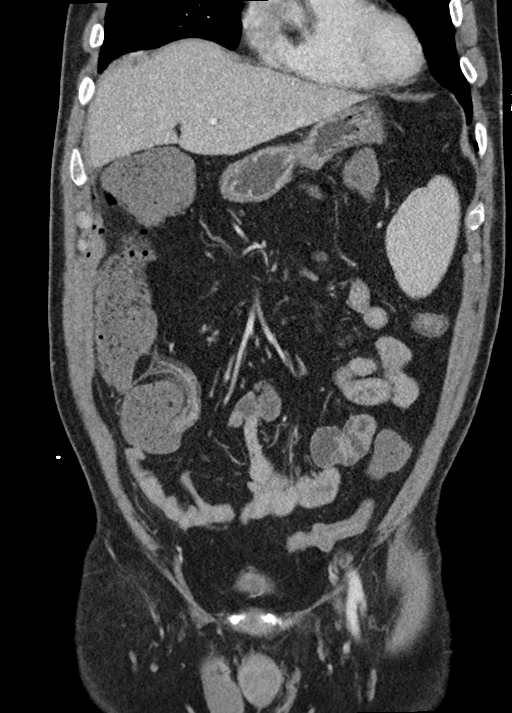
[im 39/88  soft-tissue]
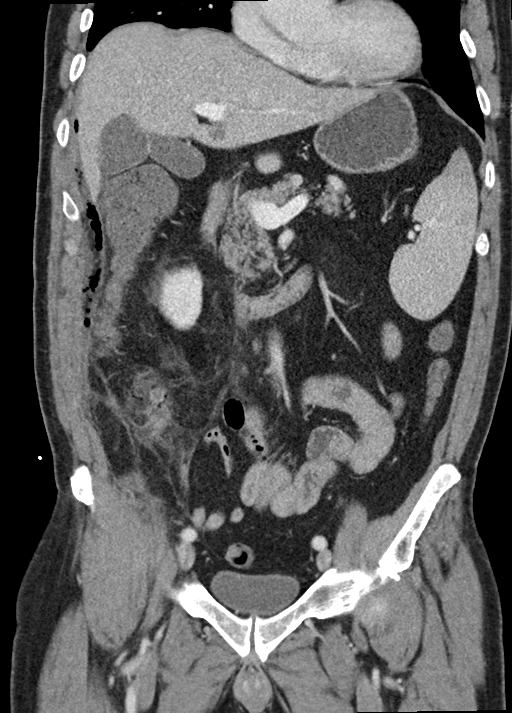
[im 49/88  soft-tissue]
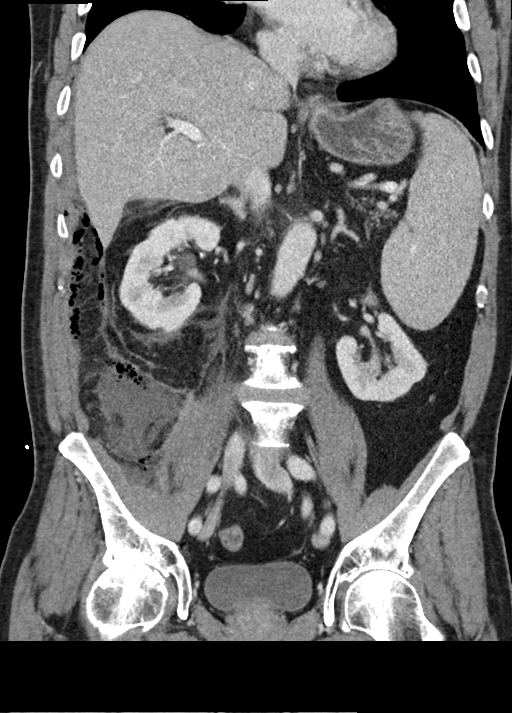

[15 of 46 positions shown; findings below may reference images not displayed]

FINDINGS: Lower chest: Small right pleural effusion. Mild patchy right lower
lobe opacity (series 5/image 6), atelectasis versus pneumonia.

Hepatobiliary: Two probable 5 mm cysts in the liver (series 3/image
16).

Gallbladder is unremarkable. No intrahepatic or extrahepatic ductal
dilatation.

Pancreas: Within normal limits.

Spleen: Splenomegaly, measuring 15.7 cm in craniocaudal dimension.

Adrenals/Urinary Tract: Adrenal glands are within normal limits.

Kidneys within normal limits.  No hydronephrosis.

Bladder is within normal limits.

Stomach/Bowel: Stomach is notable for a small hiatal hernia.

No evidence of bowel obstruction.

Abnormal appendix with surrounding inflammatory changes and gas,
suggesting perforated acute appendicitis. A 5 mm appendicolith may
be present distally (series 3/image 86).

Notably, the infection invades the right retroperitoneal space, with
a poorly formed 5.7 x 5.8 x 6.5 cm fluid and gas collection lateral
to the right psoas muscle (series 3/image 58), without a
well-defined rim/wall to suggest a true abscess.

Additional gas extends superiorly along the right lateral abdominal
wall (series 3/image 33). Notably, this degree of gas (as well as
the retroperitoneal location) are both unusual in the setting of
acute appendicitis, but the inflammatory changes are centered at the
appendix/base of the cecum.

Vascular/Lymphatic: No evidence of abdominal aortic aneurysm.

Atherosclerotic calcifications of the abdominal aorta and branch
vessels.

No suspicious abdominopelvic lymphadenopathy.

Reproductive: Prostate is unremarkable.

Other: Additional stranding along the right kidney.

No pelvic ascites.

Musculoskeletal: Visualized osseous structures are within normal
limits.
IMPRESSION: Perforated acute appendicitis with a suspected 5 mm distal
appendicolith. Notably, the infection invades the retroperitoneal
space with a poorly formed 6.5 cm fluid and gas collection lateral
to the right psoas muscle. Additional gas extends superiorly along
the right lateral abdominal wall.

These results were called by telephone at the time of interpretation
on 01/09/2018 at [DATE] to Dr. Bc, who verbally acknowledged
these results.

## 2019-09-18 DIAGNOSIS — M19041 Primary osteoarthritis, right hand: Secondary | ICD-10-CM | POA: Diagnosis not present

## 2019-09-18 DIAGNOSIS — M2550 Pain in unspecified joint: Secondary | ICD-10-CM | POA: Diagnosis not present

## 2019-09-18 DIAGNOSIS — Z6830 Body mass index (BMI) 30.0-30.9, adult: Secondary | ICD-10-CM | POA: Diagnosis not present

## 2019-09-18 DIAGNOSIS — E669 Obesity, unspecified: Secondary | ICD-10-CM | POA: Diagnosis not present

## 2019-09-18 DIAGNOSIS — M19042 Primary osteoarthritis, left hand: Secondary | ICD-10-CM | POA: Diagnosis not present

## 2019-09-19 ENCOUNTER — Ambulatory Visit
Admission: RE | Admit: 2019-09-19 | Discharge: 2019-09-19 | Disposition: A | Discharge status: Home / Self Care | Payer: PPO | Source: Ambulatory Visit | Attending: Family Medicine | Admitting: Family Medicine

## 2019-09-19 DIAGNOSIS — I714 Abdominal aortic aneurysm, without rupture, unspecified: Secondary | ICD-10-CM

## 2019-10-15 DIAGNOSIS — I10 Essential (primary) hypertension: Secondary | ICD-10-CM | POA: Diagnosis not present

## 2019-10-15 DIAGNOSIS — I77810 Thoracic aortic ectasia: Secondary | ICD-10-CM | POA: Diagnosis not present

## 2019-10-15 DIAGNOSIS — Z0001 Encounter for general adult medical examination with abnormal findings: Secondary | ICD-10-CM | POA: Diagnosis not present

## 2019-10-15 DIAGNOSIS — I714 Abdominal aortic aneurysm, without rupture: Secondary | ICD-10-CM | POA: Diagnosis not present

## 2019-10-15 DIAGNOSIS — Z79899 Other long term (current) drug therapy: Secondary | ICD-10-CM | POA: Diagnosis not present

## 2019-10-15 DIAGNOSIS — R7301 Impaired fasting glucose: Secondary | ICD-10-CM | POA: Diagnosis not present

## 2019-10-19 ENCOUNTER — Ambulatory Visit (HOSPITAL_COMMUNITY)
Admission: RE | Admit: 2019-10-19 | Discharge: 2019-10-19 | Disposition: A | Discharge status: Home / Self Care | Payer: PPO | Source: Ambulatory Visit | Attending: Hematology | Admitting: Hematology

## 2019-10-19 ENCOUNTER — Other Ambulatory Visit: Payer: Self-pay

## 2019-10-19 DIAGNOSIS — R161 Splenomegaly, not elsewhere classified: Secondary | ICD-10-CM | POA: Diagnosis not present

## 2019-10-23 ENCOUNTER — Ambulatory Visit: Payer: PPO | Attending: Internal Medicine

## 2019-10-23 DIAGNOSIS — Z23 Encounter for immunization: Secondary | ICD-10-CM | POA: Insufficient documentation

## 2019-10-23 NOTE — Progress Notes (Signed)
   Covid-19 Vaccination Clinic  Name:  Alexander Pollard    MRN: [id number] DOB: [birth year]  10/23/2019  Alexander Pollard was observed post Covid-19 immunization for 15 minutes without incidence. He was provided with Vaccine Information Sheet and instruction to access the V-Safe system.   Alexander Pollard was instructed to call 911 with any severe reactions post vaccine: Marland Kitchen Difficulty breathing  . Swelling of your face and throat  . A fast heartbeat  . A bad rash all over your body  . Dizziness and weakness    Immunizations Administered    Name Date Dose VIS Date Route   Pfizer COVID-19 Vaccine 10/23/2019  9:42 AM 0.3 mL 08/24/2019 Intramuscular   Manufacturer: [place]   Lot: [id number]   [place]: [id number]

## 2019-10-25 ENCOUNTER — Ambulatory Visit: Payer: PPO

## 2019-11-01 ENCOUNTER — Ambulatory Visit: Payer: PPO | Admitting: Hematology

## 2019-11-01 ENCOUNTER — Other Ambulatory Visit: Payer: PPO

## 2019-11-02 ENCOUNTER — Telehealth: Payer: Self-pay | Admitting: Hematology

## 2019-11-02 NOTE — Telephone Encounter (Signed)
Rescheduled per 2/18 sch msg (pt req). Called and spoke with pt, confirmed 3/12 appt

## 2019-11-16 DIAGNOSIS — I10 Essential (primary) hypertension: Secondary | ICD-10-CM | POA: Diagnosis not present

## 2019-11-16 DIAGNOSIS — Z0001 Encounter for general adult medical examination with abnormal findings: Secondary | ICD-10-CM | POA: Diagnosis not present

## 2019-11-16 DIAGNOSIS — Z125 Encounter for screening for malignant neoplasm of prostate: Secondary | ICD-10-CM | POA: Diagnosis not present

## 2019-11-16 DIAGNOSIS — Z136 Encounter for screening for cardiovascular disorders: Secondary | ICD-10-CM | POA: Diagnosis not present

## 2019-11-16 DIAGNOSIS — Z79899 Other long term (current) drug therapy: Secondary | ICD-10-CM | POA: Diagnosis not present

## 2019-11-16 DIAGNOSIS — R7309 Other abnormal glucose: Secondary | ICD-10-CM | POA: Diagnosis not present

## 2019-11-17 ENCOUNTER — Ambulatory Visit: Payer: PPO | Attending: Internal Medicine

## 2019-11-17 DIAGNOSIS — Z23 Encounter for immunization: Secondary | ICD-10-CM

## 2019-11-17 NOTE — Progress Notes (Signed)
   Covid-19 Vaccination Clinic  Name:  Alexander Pollard    MRN: [id number] DOB: July 02, 1951  11/17/2019  [name] was observed post Covid-19 immunization for 15 minutes without incident. He was provided with Vaccine Information Sheet and instruction to access the V-Safe system.   [name] was instructed to call 911 with any severe reactions post vaccine: Marland Kitchen Difficulty breathing  . Swelling of face and throat  . A fast heartbeat  . A bad rash all over body  . Dizziness and weakness   Immunizations Administered    Name Date Dose VIS Date Route   Pfizer COVID-19 Vaccine 11/17/2019  7:40 AM 0.3 mL 08/24/2019 Intramuscular   Manufacturer: [place]   Lot: [id number]   [place]: [id number]

## 2019-11-22 NOTE — Progress Notes (Signed)
HEMATOLOGY/ONCOLOGY CLINIC NOTE  Date of Service: 11/22/2019  Patient Care Team: Lujean Amel, MD as PCP - General (Family Medicine) Sueanne Margarita, MD as PCP - Cardiology (Cardiology) [name], MD as Consulting Physician (Oncology) Wilford Corner, MD as Consulting Physician (Gastroenterology) Sueanne Margarita, MD as Consulting Physician (Cardiology) [name], MD as Consulting Physician (Oncology)  CHIEF COMPLAINTS/PURPOSE OF CONSULTATION:  Hereditary Hemochromatosis  HISTORY OF PRESENTING ILLNESS:   Alexander Pollard is a wonderful 69 y.o. male who has been referred to [place] by Dr. Murriel Hopper for evaluation and management of Hereditary Hemochromatosis. The pt reports that he is doing well overall.   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 Initial workup was significatn for mild liver function abnormalities, serum iron at 264 with a 97% saturation and a Ferritin of 1498. A 01/16/14 CT A/P was obtained which revealed overall normal appearance of the liver with splenomegaly of 16cm, which was then seen to decrease to 13cm on 05/28/14 US Abdomen, and fibrosis noted. He began regular phlebotomies in September 2015, but then due to a "secretarial error," was lost to follow up between January 2017 and October 2019, and subsequently resumed regular phlebotomies in October 2019.  The pt reports that he had elevated liver functions for a few years prior to his initial diagnosis in April 2015. He denies family history of early heart or liver disease. The pt's brother also has hereditary hemochromatosis.  The pt notes that his last phlebotomy was in January of February. He denies any problems tolerating his therapeutic phlebotomies. He notes that he did not experience a worsened symptomatic difference in the 2.5 year interim in which he was lost to follow up.  Most recent lab results: 10/31/18 HGB at  15.7 09/05/18 Ferritin at 156  On review of systems, pt reports good energy levels, staying active, eating well,  and denies joint pains or swellings, abdominal pains, leg swelling, and any other symptoms.   On PMHx the pt reports appendectomy in May 2019 denies liver or heart disease. On Family Hx the pt reports brother with hereditary hemochromatosis  Interval History  Alexander Pollard is a 68 y.o. male presenting today to the management and evaluation of hereditary hemochromatosis. The patient's last visit with [place] was on 05/03/2019. The pt reports that he is doing well overall.  The pt reports he has been doing good. Pt has gotten his COVID19 vaccine. The last phlebotomy pt has gotten was about 6 months ago.   Of note since the patient's last visit, pt has had US Abdomen Complete w/Elastography ([id number]) completed on 10/19/2019 with results revealing ULTRASOUND ABDOMEN: Echogenic liver question fatty infiltration versus cirrhosis. Mild splenomegaly. Suspected medical renal disease changes of both kidneys   Lab results today (11/23/19) of CBC w/diff and CMP is as follows: all values are WNL except for Calcium at 8.6, Total Protein at 6.4, Total Bilirubin at 1.6. 11/23/2019 of AFP tumor marker <0.9 11/23/2019 of Iron and TIBC is as follows: all values are WNL except: Iron at 185, Saturation Ratios at 71, UIBC at 74 11/23/2019 of Ferritin at 104  On review of systems, pt denies abdominal pain and any other symptoms.     MEDICAL HISTORY:  Past Medical History:  Diagnosis Date  . Ascending aortic aneurysm (HCC)    18mm on chest CT 08/2019  . Dyslipidemia    a. h/o low HDL.  . Family history of early  CAD   . 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 Marked hypertensive response to exercise by ETT 06/2016 prompting addition of Toprol. b. F/u amb BP monitor normal.    SURGICAL  HISTORY: Past Surgical History:  Procedure Laterality Date  . COLONOSCOPY     next due 08/2017  . IR THORACENTESIS ASP PLEURAL SPACE W/IMG GUIDE  01/16/2018  . IR THORACENTESIS ASP PLEURAL SPACE W/IMG GUIDE  01/18/2018  . LAPAROTOMY N/A 01/09/2018   Procedure: EXPLORATORY LAPAROTOMY, RIGHT COLECTOMY, PLACEMENT OF WOUND VAC TO ABDOMEN, DRAINAGE OF RETROPERITONEAL ABSCESS;  Surgeon: Rolm Bookbinder, MD;  Location: [place];  Service: General;  Laterality: N/A;  . LAPAROTOMY N/A 01/17/2018   Procedure: EXPLORATORY LAPAROTOMY;  Surgeon: Donnie Mesa, MD;  Location: [place];  Service: General;  Laterality: N/A;    SOCIAL HISTORY: Social History   Socioeconomic History  . Marital status: Divorced    Spouse name: Not on file  . Number of children: Not on file  . Years of education: Not on file  . Highest education level: Not on file  Occupational History  . Not on file  Tobacco Use  . Smoking status: Former Research scientist (life sciences)  . Smokeless tobacco: Never Used  Substance and Sexual Activity  . Alcohol use: Yes    Alcohol/week: 0.0 standard drinks    Comment: Beer weekly.  . Drug use: No  . Sexual activity: Not on file  Other Topics Concern  . Not on file  Social History Narrative  . Not on file   Social Determinants of Health   Financial Resource Strain:   . Difficulty of Paying Living Expenses:   Food Insecurity:   . Worried About Charity fundraiser in the Last Year:   . Arboriculturist in the Last Year:   Transportation Needs:   . Film/video editor (Medical):   Marland Kitchen Lack of Transportation (Non-Medical):   Physical Activity:   . Days of Exercise per Week:   . Minutes of Exercise per Session:   Stress:   . Feeling of Stress :   Social Connections:   . Frequency of Communication with Friends and Family:   . Frequency of Social Gatherings with Friends and Family:   . Attends Religious Services:   . Active Member of Clubs or Organizations:   . Attends Archivist Meetings:   Marland Kitchen  Marital Status:   Intimate Partner Violence:   . Fear of Current or Ex-Partner:   . Emotionally Abused:   Marland Kitchen Physically Abused:   . Sexually Abused:     FAMILY HISTORY: Family History  Problem Relation Age of Onset  . Heart attack Mother   . Heart disease Mother   . Leukemia Brother   . Lymphoma Brother   . Heart disease Brother     ALLERGIES:  has No Known Allergies.  MEDICATIONS:  Current Outpatient Medications  Medication Sig Dispense Refill  . aspirin EC 81 MG tablet Take 81 mg by mouth daily.    Marland Kitchen ibuprofen (ADVIL) 200 MG tablet Take 600 mg by mouth daily.    . metoprolol succinate (TOPROL-XL) 25 MG 24 hr tablet TAKE 1 TABLET BY MOUTH EVERY DAY 90 tablet 3   No current facility-administered medications for this visit.    REVIEW OF SYSTEMS:    A 10+ POINT REVIEW OF SYSTEMS WAS OBTAINED including neurology, dermatology, psychiatry, cardiac, respiratory, lymph,  extremities, GI, GU, Musculoskeletal, constitutional, breasts, reproductive, HEENT.  All pertinent positives are noted in the HPI.  All others are negative.   PHYSICAL EXAMINATION:  . Vitals:   11/23/19 0855  BP: 129/72  Pulse: 63  Resp: 17  Temp: 98.7 F (37.1 C)  SpO2: 100%   Filed Weights   11/23/19 0855  Weight: 192 lb 14.4 oz (87.5 kg)   .Body mass index is 29.33 kg/m.   GENERAL:alert, in no acute distress and comfortable SKIN: no acute rashes, no significant lesions EYES: conjunctiva are pink and non-injected, sclera anicteric OROPHARYNX: MMM, no exudates, no oropharyngeal erythema or ulceration NECK: supple, no JVD LYMPH:  no palpable lymphadenopathy in the cervical, axillary or inguinal regions LUNGS: clear to auscultation b/l with normal respiratory effort HEART: regular rate & rhythm ABDOMEN:  normoactive bowel sounds , non tender, not distended. Extremity: no pedal edema PSYCH: alert & oriented x 3 with fluent speech NEURO: no focal motor/sensory deficits  LABORATORY DATA:  I have  reviewed the data as listed  . CBC Latest Ref Rng & Units 11/23/2019 05/15/2019 05/03/2019  WBC 4.0 - 10.5 K/uL 5.6 - 7.6  Hemoglobin 13.0 - 17.0 g/dL 14.7 14.3 16.0  Hematocrit 39.0 - 52.0 % 41.8 - 44.6  Platelets 150 - 400 K/uL 164 - 188    . CMP Latest Ref Rng & Units 11/23/2019 08/16/2019 05/03/2019  Glucose 70 - 99 mg/dL 97 118(H) 99  BUN 8 - 23 mg/dL 17 13 14   Creatinine 0.61 - 1.24 mg/dL 1.04 1.04 1.05  Sodium 135 - 145 mmol/L 142 140 140  Potassium 3.5 - 5.1 mmol/L 4.6 4.8 4.4  Chloride 98 - 111 mmol/L 110 105 107  CO2 22 - 32 mmol/L 23 24 24   Calcium 8.9 - 10.3 mg/dL 8.6(L) 9.2 8.8(L)  Total Protein 6.5 - 8.1 g/dL 6.4(L) - 7.0  Total Bilirubin 0.3 - 1.2 mg/dL 1.6(H) - 1.9(H)  Alkaline Phos 38 - 126 U/L 90 - 80  AST 15 - 41 U/L 20 - 21  ALT 0 - 44 U/L 21 - 22   . Lab Results  Component Value Date   IRON 185 (H) 11/23/2019   TIBC 259 11/23/2019   IRONPCTSAT 71 (H) 11/23/2019   (Iron and TIBC)  Lab Results  Component Value Date   FERRITIN 104 11/23/2019      RADIOGRAPHIC STUDIES: I have personally reviewed the radiological images as listed and agreed with the findings in the report.  ULTRASOUND ABDOMEN: 10/19/2019  Echogenic liver question fatty infiltration versus cirrhosis.  Mild splenomegaly.  Suspected medical renal disease changes of both kidneys  ULTRASOUND HEPATIC ELASTOGRAPHY:  Median kPa:  3.3  Diagnostic category:  < or = 5 kPa: high probability of being normal   ASSESSMENT & PLAN:  69 y.o. male with  1. Hereditary Hemochromatosis April 2015 homozygous C282Y mutation  April 2015 Ferritin at 1498 upon initial diagnosis 01/15/18 CT A/P revealed splenomegaly with spleen at 15.6cm Pt denies concern for heart of liver disease --01/29/2019 Hemochromatosis DNA revealed Homozygous C282Y  PLAN: -Discussed pt labwork today, 11/23/19; of CBC w/diff and CMP is as follows: all values are WNL except for Calcium at 8.6, Total Protein at 6.4, Total  Bilirubin at 1.6. -Discussed 11/23/2019 of AFP tumor marker wnl <0.9 -Discussed 11/23/2019 of Iron and TIBC is as follows: all values are WNL except: Iron at 185, Saturation Ratios at 71, UIBC at 74 -Discussed 11/23/2019 of Ferritin at 104 -Discussed 10/19/2019 US Abdomen Complete w/Elastography (  [id number]) -Discussed again that alcohol and red meat consumption can increase ferritin levels and increase frequency of the necessity for phlebotomy -Advised of statistics of pt family having genetic mutations  -Recommend that patient's children consider genetic testing to rule out hereditary hemochromatosis -Goal for Ferritin between 3 and 100 -[place] tells probablity of liver cirrhosis is low and stable  -Will get phlebotomy every 6 months   FOLLOW UP: Please schedule therapeutic phlebotomy at [facility] day center on Friday in 1-2 weeks and then every 6 months x 4 Labs q65[facility] prior to each phlebotomy MD visit in 12 months  The total time spent in the appt was 20 minutes and more than 50% was on counseling and direct patient cares.  All of the patient's questions were answered with apparent satisfaction. The patient knows to call the clinic with any problems, questions or concerns.   Sullivan Lone MD [place] AAHIVMS [facility] Hematology/Oncology Physician [facility]  (Office):       [phone] (Work cell):  [phone] (Fax):           [phone]  11/22/2019 4:28 PM  I, Dawayne Cirri am acting as a Education administrator for Dr. Sullivan Lone.   .I have reviewed the above documentation for accuracy and completeness, and I agree with the above. Brunetta Genera MD

## 2019-11-23 ENCOUNTER — Inpatient Hospital Stay (HOSPITAL_BASED_OUTPATIENT_CLINIC_OR_DEPARTMENT_OTHER): Payer: PPO | Admitting: Hematology

## 2019-11-23 ENCOUNTER — Inpatient Hospital Stay: Payer: PPO | Attending: Hematology

## 2019-11-23 ENCOUNTER — Other Ambulatory Visit: Payer: Self-pay

## 2019-11-23 DIAGNOSIS — Z79899 Other long term (current) drug therapy: Secondary | ICD-10-CM | POA: Insufficient documentation

## 2019-11-23 LAB — CMP (CANCER CENTER ONLY)
ALT: 21 U/L (ref 0–44)
AST: 20 U/L (ref 15–41)
Albumin: 3.9 g/dL (ref 3.5–5.0)
Alkaline Phosphatase: 90 U/L (ref 38–126)
Anion gap: 9 (ref 5–15)
BUN: 17 mg/dL (ref 8–23)
CO2: 23 mmol/L (ref 22–32)
Calcium: 8.6 mg/dL — ABNORMAL LOW (ref 8.9–10.3)
Chloride: 110 mmol/L (ref 98–111)
Creatinine: 1.04 mg/dL (ref 0.61–1.24)
GFR, Est AFR Am: >60 mL/min (ref >60)
GFR, Estimated: >60 mL/min (ref >60)
Glucose, Bld: 97 mg/dL (ref 70–99)
Potassium: 4.6 mmol/L (ref 3.5–5.1)
Sodium: 142 mmol/L (ref 135–145)
Total Bilirubin: 1.6 mg/dL — ABNORMAL HIGH (ref 0.3–1.2)
Total Protein: 6.4 g/dL — ABNORMAL LOW (ref 6.5–8.1)

## 2019-11-23 LAB — CBC WITH DIFFERENTIAL/PLATELET
Abs Immature Granulocytes: 0.02 10*3/uL (ref 0.00–0.07)
Basophils Absolute: 0 10*3/uL (ref 0.0–0.1)
Basophils Relative: 1 %
Eosinophils Absolute: 0.2 10*3/uL (ref 0.0–0.5)
Eosinophils Relative: 4 %
HCT: 41.8 % (ref 39.0–52.0)
Hemoglobin: 14.7 g/dL (ref 13.0–17.0)
Immature Granulocytes: 0 %
Lymphocytes Relative: 28 %
Lymphs Abs: 1.5 10*3/uL (ref 0.7–4.0)
MCH: 32.2 pg (ref 26.0–34.0)
MCHC: 35.2 g/dL (ref 30.0–36.0)
MCV: 91.7 fL (ref 80.0–100.0)
Monocytes Absolute: 0.5 10*3/uL (ref 0.1–1.0)
Monocytes Relative: 9 %
Neutro Abs: 3.3 10*3/uL (ref 1.7–7.7)
Neutrophils Relative %: 58 %
Platelets: 164 10*3/uL (ref 150–400)
RBC: 4.56 MIL/uL (ref 4.22–5.81)
RDW: 13.6 % (ref 11.5–15.5)
WBC: 5.6 10*3/uL (ref 4.0–10.5)
nRBC: 0 % (ref 0.0–0.2)

## 2019-11-23 LAB — IRON AND TIBC
Iron: 185 ug/dL — ABNORMAL HIGH (ref 42–163)
Saturation Ratios: 71 % — ABNORMAL HIGH (ref 20–55)
TIBC: 259 ug/dL (ref 202–409)
UIBC: 74 ug/dL — ABNORMAL LOW (ref 117–376)

## 2019-11-23 LAB — FERRITIN: Ferritin: 104 ng/mL (ref 24–336)

## 2019-11-24 LAB — AFP TUMOR MARKER: AFP, Serum, Tumor Marker: <0.9 ng/mL (ref 0.0–8.3)

## 2019-11-27 ENCOUNTER — Telehealth: Payer: Self-pay | Admitting: Hematology

## 2019-11-27 NOTE — Telephone Encounter (Signed)
Scheduled per 03/12 los, patient has been called and notified.

## 2020-03-10 DIAGNOSIS — D2262 Melanocytic nevi of left upper limb, including shoulder: Secondary | ICD-10-CM | POA: Diagnosis not present

## 2020-03-10 DIAGNOSIS — Z85828 Personal history of other malignant neoplasm of skin: Secondary | ICD-10-CM | POA: Diagnosis not present

## 2020-03-10 DIAGNOSIS — L814 Other melanin hyperpigmentation: Secondary | ICD-10-CM | POA: Diagnosis not present

## 2020-03-10 DIAGNOSIS — C44722 Squamous cell carcinoma of skin of right lower limb, including hip: Secondary | ICD-10-CM | POA: Diagnosis not present

## 2020-03-10 DIAGNOSIS — L570 Actinic keratosis: Secondary | ICD-10-CM | POA: Diagnosis not present

## 2020-03-10 DIAGNOSIS — D225 Melanocytic nevi of trunk: Secondary | ICD-10-CM | POA: Diagnosis not present

## 2020-03-10 DIAGNOSIS — L821 Other seborrheic keratosis: Secondary | ICD-10-CM | POA: Diagnosis not present

## 2020-04-21 ENCOUNTER — Telehealth: Payer: Self-pay | Admitting: Hematology

## 2020-04-21 NOTE — Telephone Encounter (Signed)
Unable to reach pt. Pt requested to r/s appt on 9/13 per 8/9 sch message. Left voicemail for pt to give office a call back.

## 2020-04-21 NOTE — Telephone Encounter (Signed)
Rescheduled appt per 8/9 sch message. Pt is aware of appt time and date.

## 2020-05-23 ENCOUNTER — Other Ambulatory Visit: Payer: Self-pay

## 2020-05-23 ENCOUNTER — Other Ambulatory Visit: Payer: Self-pay | Admitting: *Deleted

## 2020-05-23 ENCOUNTER — Inpatient Hospital Stay: Payer: PPO | Attending: Hematology

## 2020-05-23 DIAGNOSIS — Z79899 Other long term (current) drug therapy: Secondary | ICD-10-CM | POA: Diagnosis not present

## 2020-05-23 LAB — CBC WITH DIFFERENTIAL (CANCER CENTER ONLY)
Abs Immature Granulocytes: 0.02 10*3/uL (ref 0.00–0.07)
Basophils Absolute: 0 10*3/uL (ref 0.0–0.1)
Basophils Relative: 1 %
Eosinophils Absolute: 0.2 10*3/uL (ref 0.0–0.5)
Eosinophils Relative: 3 %
HCT: 45.9 % (ref 39.0–52.0)
Hemoglobin: 16 g/dL (ref 13.0–17.0)
Immature Granulocytes: 0 %
Lymphocytes Relative: 26 %
Lymphs Abs: 1.8 10*3/uL (ref 0.7–4.0)
MCH: 32.1 pg (ref 26.0–34.0)
MCHC: 34.9 g/dL (ref 30.0–36.0)
MCV: 92 fL (ref 80.0–100.0)
Monocytes Absolute: 0.7 10*3/uL (ref 0.1–1.0)
Monocytes Relative: 9 %
Neutro Abs: 4.3 10*3/uL (ref 1.7–7.7)
Neutrophils Relative %: 61 %
Platelet Count: 161 10*3/uL (ref 150–400)
RBC: 4.99 MIL/uL (ref 4.22–5.81)
RDW: 13.4 % (ref 11.5–15.5)
WBC Count: 6.9 10*3/uL (ref 4.0–10.5)
nRBC: 0 % (ref 0.0–0.2)

## 2020-05-23 LAB — CMP (CANCER CENTER ONLY)
ALT: 18 U/L (ref 0–44)
AST: 18 U/L (ref 15–41)
Albumin: 4.1 g/dL (ref 3.5–5.0)
Alkaline Phosphatase: 71 U/L (ref 38–126)
Anion gap: 11 (ref 5–15)
BUN: 14 mg/dL (ref 8–23)
CO2: 22 mmol/L (ref 22–32)
Calcium: 9 mg/dL (ref 8.9–10.3)
Chloride: 105 mmol/L (ref 98–111)
Creatinine: 1.05 mg/dL (ref 0.61–1.24)
GFR, Est AFR Am: >60 mL/min (ref >60)
GFR, Estimated: >60 mL/min (ref >60)
Glucose, Bld: 127 mg/dL — ABNORMAL HIGH (ref 70–99)
Potassium: 4.3 mmol/L (ref 3.5–5.1)
Sodium: 138 mmol/L (ref 135–145)
Total Bilirubin: 2 mg/dL — ABNORMAL HIGH (ref 0.3–1.2)
Total Protein: 6.9 g/dL (ref 6.5–8.1)

## 2020-05-23 LAB — FERRITIN: Ferritin: 145 ng/mL (ref 24–336)

## 2020-05-23 LAB — IRON AND TIBC
Iron: 233 ug/dL — ABNORMAL HIGH (ref 42–163)
Saturation Ratios: 86 % — ABNORMAL HIGH (ref 20–55)
TIBC: 271 ug/dL (ref 202–409)
UIBC: 39 ug/dL — ABNORMAL LOW (ref 117–376)

## 2020-05-26 ENCOUNTER — Other Ambulatory Visit: Payer: PPO

## 2020-06-03 ENCOUNTER — Ambulatory Visit (INDEPENDENT_AMBULATORY_CARE_PROVIDER_SITE_OTHER): Payer: PPO | Admitting: Podiatry

## 2020-06-03 ENCOUNTER — Other Ambulatory Visit: Payer: Self-pay

## 2020-06-03 ENCOUNTER — Encounter: Payer: Self-pay | Admitting: Podiatry

## 2020-06-03 DIAGNOSIS — M2012 Hallux valgus (acquired), left foot: Secondary | ICD-10-CM | POA: Diagnosis not present

## 2020-06-03 DIAGNOSIS — Z23 Encounter for immunization: Secondary | ICD-10-CM | POA: Diagnosis not present

## 2020-06-03 DIAGNOSIS — M2011 Hallux valgus (acquired), right foot: Secondary | ICD-10-CM

## 2020-06-03 DIAGNOSIS — M2041 Other hammer toe(s) (acquired), right foot: Secondary | ICD-10-CM

## 2020-06-03 DIAGNOSIS — M2042 Other hammer toe(s) (acquired), left foot: Secondary | ICD-10-CM | POA: Diagnosis not present

## 2020-06-03 NOTE — Progress Notes (Signed)
This patient presents to the office for a foot exam.  He says he has been an avid runner for years and he has developed foot  Deformities.  He presents to the office for a foot evaluation.  He says there is no pain associated with his feet.    Vascular  Dorsalis pedis and posterior tibial pulses are palpable  B/L.  Capillary return  WNL.  Temperature gradient is  WNL.  Skin turgor  WNL  Sensorium  Senn Weinstein monofilament wire  WNL. Normal tactile sensation.  Nail Exam  Patient has normal nails with thickened nails noted both feet.  .  Orthopedic  Exam  Muscle tone and muscle strength  WNL.  No limitations of motion feet  B/L.  No crepitus or joint effusion noted.  Foot type is unremarkable and digits show no abnormalities.  HAV  B/L.  Hammer toes  B/l.    Tailors bunions  B/L.  Excessive pronation upon weight bearing.  Skin  No open lesions.  Normal skin texture and turgor.  HAV  B/L  Hammer toes  B/L.  Tailors Bunion  B/L  IE.  Foot examination was performed.  RTC prn.   Gardiner Barefoot DPM

## 2020-07-31 NOTE — Progress Notes (Signed)
Cardiology Office Note:    Date:  08/01/2020   ID:  Alexander Pollard, DOB 12-Oct-1950, MRN [id number]  PCP:  Lujean Amel, MD  Cardiologist:  Fransico Him, MD    Referring MD: Lujean Amel, MD   Chief Complaint  Patient presents with  . Hypertension  . Hyperlipidemia    History of Present Illness:    Alexander Pollard is a 69 y.o. male with a hx of hemachromatosis,mildly dilated aortic root, diastolic dysfunction and family history of CAD. He underwent ETT 06/2015 due to atypical chest pain - had hypertensive response to exerciseup to 195/107, prompting addition of Toprol. Nuc 07/2015 was normal, EF 60%. Ambulatory BP monitor 07/2015 was normal.  2D echo 04/10/2018 normal LV function with mild grade 1 DD and mildly dilated ascending aorta.  He is here today for followup and is doing well.  He denies any chest pain or pressure, SOB, DOE, PND, orthopnea, LE edema, dizziness, palpitations or syncope. He is compliant with his meds and is tolerating meds with no SE.    Past Medical History:  Diagnosis Date  . Ascending aortic aneurysm (HCC)    59mm on chest CT 08/2019  . Dyslipidemia    a. h/o low HDL.  . Family history of early CAD   . 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 Marked hypertensive response to exercise by ETT 06/2016 prompting addition of Toprol. b. F/u amb BP monitor normal.    Past Surgical History:  Procedure Laterality Date  . COLONOSCOPY     next due 08/2017  . IR THORACENTESIS ASP PLEURAL SPACE W/IMG GUIDE  01/16/2018  . IR THORACENTESIS ASP PLEURAL SPACE W/IMG GUIDE  01/18/2018  . LAPAROTOMY N/A 01/09/2018   Procedure: EXPLORATORY LAPAROTOMY, RIGHT COLECTOMY, PLACEMENT OF WOUND VAC TO ABDOMEN, DRAINAGE OF RETROPERITONEAL ABSCESS;  Surgeon: Rolm Bookbinder, MD;  Location: [place];  Service: General;  Laterality: N/A;  . LAPAROTOMY N/A 01/17/2018   Procedure: EXPLORATORY  LAPAROTOMY;  Surgeon: Donnie Mesa, MD;  Location: [place];  Service: General;  Laterality: N/A;    Current Medications: Current Meds  Medication Sig  . aspirin EC 81 MG tablet Take 81 mg by mouth daily.  . metoprolol succinate (TOPROL-XL) 25 MG 24 hr tablet TAKE 1 TABLET BY MOUTH EVERY DAY     Allergies:   Patient has no known allergies.   Social History   Socioeconomic History  . Marital status: Divorced    Spouse name: Not on file  . Number of children: Not on file  . Years of education: Not on file  . Highest education level: Not on file  Occupational History  . Not on file  Tobacco Use  . Smoking status: Former Research scientist (life sciences)  . Smokeless tobacco: Never Used  Vaping Use  . Vaping Use: Never used  Substance and Sexual Activity  . Alcohol use: Yes    Alcohol/week: 0.0 standard drinks    Comment: Beer weekly.  . Drug use: No  . Sexual activity: Not on file  Other Topics Concern  . Not on file  Social History Narrative  . Not on file   Social Determinants of Health   Financial Resource Strain:   . Difficulty of Paying Living Expenses: Not on file  Food Insecurity:   . Worried About Charity fundraiser in the Last Year: Not on file  . Ran Out of Food in  the Last Year: Not on file  Transportation Needs:   . Lack of Transportation (Medical): Not on file  . Lack of Transportation (Non-Medical): Not on file  Physical Activity:   . Days of Exercise per Week: Not on file  . Minutes of Exercise per Session: Not on file  Stress:   . Feeling of Stress : Not on file  Social Connections:   . Frequency of Communication with Friends and Family: Not on file  . Frequency of Social Gatherings with Friends and Family: Not on file  . Attends Religious Services: Not on file  . Active Member of Clubs or Organizations: Not on file  . Attends Archivist Meetings: Not on file  . Marital Status: Not on file     Family History: The patient's family history includes Heart  attack in his mother; Heart disease in his brother and mother; Leukemia in his brother; Lymphoma in his brother.  ROS:   Please see the history of present illness.    ROS  All other systems reviewed and negative.   EKGs/Labs/Other Studies Reviewed:    The following studies were reviewed today: none  EKG:  EKG is  ordered today.  The ekg ordered today demonstrates NSR at 76bpm with no ST changes  Recent Labs: 05/23/2020: ALT 18; BUN 14; Creatinine 1.05; Hemoglobin 16.0; Platelet Count 161; Potassium 4.3; Sodium 138   Recent Lipid Panel    Component Value Date/Time   CHOL 103 08/16/2019 0746   TRIG 129 08/16/2019 0746   HDL 28 (L) 08/16/2019 0746   CHOLHDL 3.7 08/16/2019 0746   LDLCALC 52 08/16/2019 0746    Physical Exam:    VS:  BP 112/68   Pulse 67   Ht 5\' 8"  (1.727 m)   Wt 185 lb (83.9 kg)   SpO2 98%   BMI 28.13 kg/m     Wt Readings from Last 3 Encounters:  08/01/20 185 lb (83.9 kg)  11/23/19 192 lb 14.4 oz (87.5 kg)  08/02/19 186 lb 9.6 oz (84.6 kg)     GEN: Well nourished, well developed in no acute distress HEENT: Normal NECK: No JVD; No carotid bruits LYMPHATICS: No lymphadenopathy CARDIAC:RRR, no murmurs, rubs, gallops RESPIRATORY:  Clear to auscultation without rales, wheezing or rhonchi  ABDOMEN: Soft, non-tender, non-distended MUSCULOSKELETAL:  No edema; No deformity  SKIN: Warm and dry NEUROLOGIC:  Alert and oriented x 3 PSYCHIATRIC:  Normal affect    ASSESSMENT:    1. Essential hypertension   2. Dyslipidemia   3. Family history of early CAD   44. Acquired dilation of ascending aorta and aortic root ([place])   5. Aortic atherosclerosis ([place])   6. Hereditary hemochromatosis ([place])    PLAN:    In order of problems listed above:  1.  HTN -BP is well controlled on exam -continue on Toprol XL 25mg  daily  2.  HLD -followed by PCP -LDL goal < 70 -LDL was 57 in March 2021  3.  Fm Hx of premature CAD -ETT in 2016 with no ischemia -he has not  had any anginal symptoms -I will get a coronary Ca score  4.  Dilated ascending aorta/aortic atherosclerosis -2D echo 08/2019 showed mildly dilated ascending aorta at 5mm -repeat 2D echo in 08/2020 -encouraged him to avoid upper body weight lifting  -encouraged him to do aerobic exercise  5.  Hereditary hemochromatosis -followed by Dr. Beryle Beams and managed with periodic phlebotomies   Medication Adjustments/Labs and Tests Ordered: Current medicines are reviewed  at length with the patient today.  Concerns regarding medicines are outlined above.  Orders Placed This Encounter  Procedures  . EKG 12-Lead   No orders of the defined types were placed in this encounter.   Signed, Fransico Him, MD  08/01/2020 9:18 AM    [place]

## 2020-08-01 ENCOUNTER — Ambulatory Visit (INDEPENDENT_AMBULATORY_CARE_PROVIDER_SITE_OTHER): Payer: PPO | Admitting: Cardiology

## 2020-08-01 ENCOUNTER — Encounter: Payer: Self-pay | Admitting: Cardiology

## 2020-08-01 ENCOUNTER — Other Ambulatory Visit: Payer: Self-pay

## 2020-08-01 VITALS — BP 112/68 | HR 67 | Ht 68.0 in | Wt 185.0 lb

## 2020-08-01 DIAGNOSIS — E785 Hyperlipidemia, unspecified: Secondary | ICD-10-CM

## 2020-08-01 DIAGNOSIS — I77819 Aortic ectasia, unspecified site: Secondary | ICD-10-CM | POA: Diagnosis not present

## 2020-08-01 DIAGNOSIS — I700 Atherosclerosis of aorta: Secondary | ICD-10-CM

## 2020-08-01 DIAGNOSIS — Z8249 Family history of ischemic heart disease and other diseases of the circulatory system: Secondary | ICD-10-CM | POA: Diagnosis not present

## 2020-08-01 DIAGNOSIS — I10 Essential (primary) hypertension: Secondary | ICD-10-CM

## 2020-08-01 NOTE — Patient Instructions (Signed)
Medication Instructions:  Your physician recommends that you continue on your current medications as directed. Please refer to the Current Medication list given to you today.  *If you need a refill on your cardiac medications before your next appointment, please call your pharmacy*  Testing/Procedures: Your provider has recommended that you have a calcium score CT scan.   Your physician has requested that you have an echocardiogram. Echocardiography is a painless test that uses sound waves to create images of your heart. It provides your doctor with information about the size and shape of your heart and how well your heart's chambers and valves are working. This procedure takes approximately one hour. There are no restrictions for this procedure.  Follow-Up: At [facility], you and your health needs are our priority.  As part of our continuing mission to provide you with exceptional heart care, we have created designated Provider Care Teams.  These Care Teams include your primary Cardiologist (physician) and Advanced Practice Providers (APPs -  Physician Assistants and Nurse Practitioners) who all work together to provide you with the care you need, when you need it.  Your next appointment:   1 year(s)  The format for your next appointment:   In Person  Provider:   You may see Fransico Him, MD or one of the following Advanced Practice Providers on your designated Care Team:    Melina Copa, PA-C  Ermalinda Barrios, PA-C

## 2020-08-01 NOTE — Addendum Note (Signed)
Addended by: Antonieta Iba on: 08/01/2020 09:25 AM   Modules accepted: Orders

## 2020-08-29 ENCOUNTER — Other Ambulatory Visit: Payer: Self-pay | Admitting: Cardiology

## 2020-08-29 ENCOUNTER — Other Ambulatory Visit: Payer: Self-pay

## 2020-08-29 ENCOUNTER — Ambulatory Visit (HOSPITAL_COMMUNITY): Payer: PPO | Attending: Internal Medicine

## 2020-08-29 ENCOUNTER — Ambulatory Visit (INDEPENDENT_AMBULATORY_CARE_PROVIDER_SITE_OTHER)
Admission: RE | Admit: 2020-08-29 | Discharge: 2020-08-29 | Disposition: A | Discharge status: Home / Self Care | Payer: Self-pay | Source: Ambulatory Visit | Attending: Cardiology | Admitting: Cardiology

## 2020-08-29 DIAGNOSIS — I700 Atherosclerosis of aorta: Secondary | ICD-10-CM | POA: Diagnosis not present

## 2020-08-29 DIAGNOSIS — Z8249 Family history of ischemic heart disease and other diseases of the circulatory system: Secondary | ICD-10-CM

## 2020-08-29 DIAGNOSIS — I77819 Aortic ectasia, unspecified site: Secondary | ICD-10-CM | POA: Insufficient documentation

## 2020-08-29 LAB — ECHOCARDIOGRAM COMPLETE
Area-P 1/2: 2.5 cm2
S' Lateral: 2.3 cm

## 2020-09-03 ENCOUNTER — Encounter: Payer: Self-pay | Admitting: Cardiology

## 2020-09-03 ENCOUNTER — Telehealth: Payer: Self-pay | Admitting: *Deleted

## 2020-09-03 DIAGNOSIS — E785 Hyperlipidemia, unspecified: Secondary | ICD-10-CM

## 2020-09-03 NOTE — Telephone Encounter (Signed)
The patient has been notified of the result and verbalized understanding.  All questions (if any) were answered. Darrell Jewel, RN 09/03/2020 12:15 PM    Orders for labs placed and set appt for next week.

## 2020-09-03 NOTE — Telephone Encounter (Signed)
-----   Message from Sueanne Margarita, MD sent at 09/03/2020 11:24 AM EST ----- CT showed elevated coronary Ca score of 83 which places him in the 43rd % for age and sex matched controls.  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

## 2020-09-08 ENCOUNTER — Other Ambulatory Visit: Payer: PPO | Admitting: *Deleted

## 2020-09-08 ENCOUNTER — Other Ambulatory Visit: Payer: Self-pay

## 2020-09-08 DIAGNOSIS — E785 Hyperlipidemia, unspecified: Secondary | ICD-10-CM | POA: Diagnosis not present

## 2020-09-08 LAB — LIPID PANEL
Chol/HDL Ratio: 3.6 ratio (ref 0.0–5.0)
Cholesterol, Total: 105 mg/dL (ref 100–199)
HDL: 29 mg/dL — ABNORMAL LOW (ref >39)
LDL Chol Calc (NIH): 56 mg/dL (ref 0–99)
Triglycerides: 103 mg/dL (ref 0–149)
VLDL Cholesterol Cal: 20 mg/dL (ref 5–40)

## 2020-09-08 LAB — ALT: ALT: 19 IU/L (ref 0–44)

## 2020-09-10 ENCOUNTER — Other Ambulatory Visit: Payer: Self-pay | Admitting: Cardiology

## 2020-09-10 DIAGNOSIS — I10 Essential (primary) hypertension: Secondary | ICD-10-CM

## 2020-11-06 DIAGNOSIS — J0100 Acute maxillary sinusitis, unspecified: Secondary | ICD-10-CM | POA: Diagnosis not present

## 2020-11-23 NOTE — Progress Notes (Signed)
HEMATOLOGY/ONCOLOGY CLINIC NOTE  Date of Service: 11/24/2020  Patient Care Team: Lujean Amel, MD as PCP - General (Family Medicine) Sueanne Margarita, MD as PCP - Cardiology (Cardiology) [name], MD as Consulting Physician (Oncology) Wilford Corner, MD as Consulting Physician (Gastroenterology) Sueanne Margarita, MD as Consulting Physician (Cardiology) [name], MD as Consulting Physician (Oncology)  CHIEF COMPLAINTS/PURPOSE OF CONSULTATION:  Hereditary Hemochromatosis  HISTORY OF PRESENTING ILLNESS:   Alexander Pollard is a wonderful 70 y.o. male who has been referred to [place] by Dr. Murriel Hopper for evaluation and management of Hereditary Hemochromatosis. The pt reports that he is doing well overall.   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 Initial workup was significatn for mild liver function abnormalities, serum iron at 264 with a 97% saturation and a Ferritin of 1498. A 01/16/14 CT A/P was obtained which revealed overall normal appearance of the liver with splenomegaly of 16cm, which was then seen to decrease to 13cm on 05/28/14 US Abdomen, and fibrosis noted. He began regular phlebotomies in September 2015, but then due to a "secretarial error," was lost to follow up between January 2017 and October 2019, and subsequently resumed regular phlebotomies in October 2019.  The pt reports that he had elevated liver functions for a few years prior to his initial diagnosis in April 2015. He denies family history of early heart or liver disease. The pt's brother also has hereditary hemochromatosis.  The pt notes that his last phlebotomy was in January of February. He denies any problems tolerating his therapeutic phlebotomies. He notes that he did not experience a worsened symptomatic difference in the 2.5 year interim in which he was lost to follow up.  Most recent lab results: 10/31/18 HGB at  15.7 09/05/18 Ferritin at 156  On review of systems, pt reports good energy levels, staying active, eating well,  and denies joint pains or swellings, abdominal pains, leg swelling, and any other symptoms.   On PMHx the pt reports appendectomy in May 2019 denies liver or heart disease. On Family Hx the pt reports brother with hereditary hemochromatosis  Interval History  Alexander Pollard is a 70 y.o. male presenting today to the management and evaluation of hereditary hemochromatosis. The patient's last visit with [place] was on 11/23/2019. The pt reports that he is doing well overall.  The pt reports no new symptoms or concerns. He notes no new medical concerns or medication changes. The last phlebotomy the patient had was over a year. He does not remember having one last year, but was getting them at [facility]. The pt is agreeable to get them done here if needed. The pt notes no major changes in his diet. He has potentially even decreased his red meat and alcohol intake. He notes no issues tolerating phlebotomies in the past.   Lab results today 11/24/2020 of CBC w/diff and CMP is as follows: all values are WNL. CMP pending. 11/24/2020 Iron saturation 95% 11/24/2020 Ferritin 196  On review of systems, pt denies skin changes, joint pain or swelling, new bone pains, abdominal pain, leg swelling, and any other symptoms.  MEDICAL HISTORY:  Past Medical History:  Diagnosis Date  . Ascending aortic aneurysm (HCC)    39mm on chest CT 08/2020  . Dyslipidemia    a. h/o low HDL.  . Family history of early CAD   . Hemochromatosis 03/12/2014   C282Y homozygote dx 01/02/14  Ferritin  1498  Iron saturation 97%  . 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 Marked hypertensive response to exercise by ETT 06/2016 prompting addition of Toprol. b. F/u amb BP monitor normal.    SURGICAL HISTORY: Past Surgical History:  Procedure Laterality Date  . COLONOSCOPY     next due 08/2017  . IR THORACENTESIS  ASP PLEURAL SPACE W/IMG GUIDE  01/16/2018  . IR THORACENTESIS ASP PLEURAL SPACE W/IMG GUIDE  01/18/2018  . LAPAROTOMY N/A 01/09/2018   Procedure: EXPLORATORY LAPAROTOMY, RIGHT COLECTOMY, PLACEMENT OF WOUND VAC TO ABDOMEN, DRAINAGE OF RETROPERITONEAL ABSCESS;  Surgeon: Rolm Bookbinder, MD;  Location: [place];  Service: General;  Laterality: N/A;  . LAPAROTOMY N/A 01/17/2018   Procedure: EXPLORATORY LAPAROTOMY;  Surgeon: Donnie Mesa, MD;  Location: [place];  Service: General;  Laterality: N/A;    SOCIAL HISTORY: Social History   Socioeconomic History  . Marital status: Divorced    Spouse name: Not on file  . Number of children: Not on file  . Years of education: Not on file  . Highest education level: Not on file  Occupational History  . Not on file  Tobacco Use  . Smoking status: Former Research scientist (life sciences)  . Smokeless tobacco: Never Used  Vaping Use  . Vaping Use: Never used  Substance and Sexual Activity  . Alcohol use: Yes    Alcohol/week: 0.0 standard drinks    Comment: Beer weekly.  . Drug use: No  . Sexual activity: Not on file  Other Topics Concern  . Not on file  Social History Narrative  . Not on file   Social Determinants of Health   Financial Resource Strain: Not on file  Food Insecurity: Not on file  Transportation Needs: Not on file  Physical Activity: Not on file  Stress: Not on file  Social Connections: Not on file  Intimate Partner Violence: Not on file    FAMILY HISTORY: Family History  Problem Relation Age of Onset  . Heart attack Mother   . Heart disease Mother   . Leukemia Brother   . Lymphoma Brother   . Heart disease Brother     ALLERGIES:  has No Known Allergies.  MEDICATIONS:  Current Outpatient Medications  Medication Sig Dispense Refill  . aspirin EC 81 MG tablet Take 81 mg by mouth daily.    . metoprolol succinate (TOPROL-XL) 25 MG 24 hr tablet TAKE 1 TABLET BY MOUTH EVERY DAY 90 tablet 3   No current facility-administered medications for this  visit.    REVIEW OF SYSTEMS:   10 Point review of Systems was done is negative except as noted above.  PHYSICAL EXAMINATION:  Vitals:   11/24/20 1206  BP: 123/79  Pulse: 62  Resp: 14  Temp: 97.8 F (36.6 C)  SpO2: 100%   Filed Weights   11/24/20 1206  Weight: 182 lb 8 oz (82.8 kg)   .Body mass index is 27.75 kg/m.   GENERAL:alert, in no acute distress and comfortable SKIN: no acute rashes, no significant lesions EYES: conjunctiva are pink and non-injected, sclera anicteric OROPHARYNX: MMM, no exudates, no oropharyngeal erythema or ulceration NECK: supple, no JVD LYMPH:  no palpable lymphadenopathy in the cervical, axillary or inguinal regions LUNGS: clear to auscultation b/l with normal respiratory effort HEART: regular rate & rhythm ABDOMEN:  normoactive bowel sounds , non tender, not distended. Extremity: no pedal edema PSYCH: alert & oriented x 3 with fluent speech NEURO: no focal motor/sensory deficits  LABORATORY DATA:  I have reviewed the  data as listed  . CBC Latest Ref Rng & Units 11/24/2020 05/23/2020 11/23/2019  WBC 4.0 - 10.5 K/uL 7.4 6.9 5.6  Hemoglobin 13.0 - 17.0 g/dL 15.9 16.0 14.7  Hematocrit 39.0 - 52.0 % 45.6 45.9 41.8  Platelets 150 - 400 K/uL 176 161 164    . CMP Latest Ref Rng & Units 11/24/2020 09/08/2020 05/23/2020  Glucose 70 - 99 mg/dL 96 - 127(H)  BUN 8 - 23 mg/dL 13 - 14  Creatinine 0.61 - 1.24 mg/dL 1.02 - 1.05  Sodium 135 - 145 mmol/L 141 - 138  Potassium 3.5 - 5.1 mmol/L 4.9 - 4.3  Chloride 98 - 111 mmol/L 107 - 105  CO2 22 - 32 mmol/L 27 - 22  Calcium 8.9 - 10.3 mg/dL 9.2 - 9.0  Total Protein 6.5 - 8.1 g/dL 7.2 - 6.9  Total Bilirubin 0.3 - 1.2 mg/dL 1.8(H) - 2.0(H)  Alkaline Phos 38 - 126 U/L 73 - 71  AST 15 - 41 U/L 25 - 18  ALT 0 - 44 U/L 27 19 18    . Lab Results  Component Value Date   IRON 263 (H) 11/24/2020   TIBC 276 11/24/2020   IRONPCTSAT 95 (H) 11/24/2020   (Iron and TIBC)  Lab Results  Component Value Date    FERRITIN 196 11/24/2020      RADIOGRAPHIC STUDIES: I have personally reviewed the radiological images as listed and agreed with the findings in the report.  ULTRASOUND ABDOMEN: 10/19/2019  Echogenic liver question fatty infiltration versus cirrhosis.  Mild splenomegaly.  Suspected medical renal disease changes of both kidneys  ULTRASOUND HEPATIC ELASTOGRAPHY:  Median kPa:  3.3  Diagnostic category:  < or = 5 kPa: high probability of being normal   ASSESSMENT & PLAN:   70 y.o. male with  1. Hereditary Hemochromatosis April 2015 homozygous C282Y mutation  April 2015 Ferritin at 1498 upon initial diagnosis 01/15/18 CT A/P revealed splenomegaly with spleen at 15.6cm Pt denies concern for heart of liver disease --01/29/2019 Hemochromatosis DNA revealed Homozygous C282Y  PLAN: -Discussed pt labwork today, 11/24/2020; blood counts completely normal,  Ferritin 195, iron saturation 95% -Advised pt that given the trends of his labs, we will set up for phlebotomies every 6 months at this time. -Goal for Ferritin between 50 and 100. -Will get phlebotomy in 1-2 weeks. -Will see back in 12 months with labs.   FOLLOW UP: Plz schedule therapeutic phlebotomy in 1-2 weeks and then every 6 months x 4 Labs with each phlebotomy MD visit in 12 months   The total time spent in the appointment was 20 minutes and more than 50% was on counseling and direct patient cares.   All of the patient's questions were answered with apparent satisfaction. The patient knows to call the clinic with any problems, questions or concerns.   Sullivan Lone MD [place] AAHIVMS [facility] Hematology/Oncology Physician [facility]  (Office):       [phone] (Work cell):  [phone] (Fax):           [phone]  11/24/2020 12:16 PM  I, Reinaldo Raddle, am acting as scribe for Dr. Sullivan Lone, MD.    .I have reviewed the above documentation for accuracy and completeness, and I agree with the  above. Brunetta Genera MD

## 2020-11-24 ENCOUNTER — Other Ambulatory Visit: Payer: Self-pay

## 2020-11-24 ENCOUNTER — Inpatient Hospital Stay: Payer: PPO | Admitting: Hematology

## 2020-11-24 ENCOUNTER — Inpatient Hospital Stay: Payer: PPO | Attending: Hematology

## 2020-11-24 ENCOUNTER — Other Ambulatory Visit: Payer: Self-pay | Admitting: *Deleted

## 2020-11-24 DIAGNOSIS — Z7982 Long term (current) use of aspirin: Secondary | ICD-10-CM | POA: Insufficient documentation

## 2020-11-24 DIAGNOSIS — Z8249 Family history of ischemic heart disease and other diseases of the circulatory system: Secondary | ICD-10-CM | POA: Insufficient documentation

## 2020-11-24 DIAGNOSIS — Z806 Family history of leukemia: Secondary | ICD-10-CM | POA: Insufficient documentation

## 2020-11-24 DIAGNOSIS — Z79899 Other long term (current) drug therapy: Secondary | ICD-10-CM | POA: Diagnosis not present

## 2020-11-24 DIAGNOSIS — Z87891 Personal history of nicotine dependence: Secondary | ICD-10-CM | POA: Insufficient documentation

## 2020-11-24 DIAGNOSIS — Z807 Family history of other malignant neoplasms of lymphoid, hematopoietic and related tissues: Secondary | ICD-10-CM | POA: Diagnosis not present

## 2020-11-24 LAB — CBC WITH DIFFERENTIAL (CANCER CENTER ONLY)
Abs Immature Granulocytes: 0.02 10*3/uL (ref 0.00–0.07)
Basophils Absolute: 0 10*3/uL (ref 0.0–0.1)
Basophils Relative: 0 %
Eosinophils Absolute: 0.3 10*3/uL (ref 0.0–0.5)
Eosinophils Relative: 4 %
HCT: 45.6 % (ref 39.0–52.0)
Hemoglobin: 15.9 g/dL (ref 13.0–17.0)
Immature Granulocytes: 0 %
Lymphocytes Relative: 21 %
Lymphs Abs: 1.5 10*3/uL (ref 0.7–4.0)
MCH: 32 pg (ref 26.0–34.0)
MCHC: 34.9 g/dL (ref 30.0–36.0)
MCV: 91.8 fL (ref 80.0–100.0)
Monocytes Absolute: 0.6 10*3/uL (ref 0.1–1.0)
Monocytes Relative: 8 %
Neutro Abs: 4.9 10*3/uL (ref 1.7–7.7)
Neutrophils Relative %: 67 %
Platelet Count: 176 10*3/uL (ref 150–400)
RBC: 4.97 MIL/uL (ref 4.22–5.81)
RDW: 13.5 % (ref 11.5–15.5)
WBC Count: 7.4 10*3/uL (ref 4.0–10.5)
nRBC: 0 % (ref 0.0–0.2)

## 2020-11-24 LAB — CMP (CANCER CENTER ONLY)
ALT: 27 U/L (ref 0–44)
AST: 25 U/L (ref 15–41)
Albumin: 4.2 g/dL (ref 3.5–5.0)
Alkaline Phosphatase: 73 U/L (ref 38–126)
Anion gap: 7 (ref 5–15)
BUN: 13 mg/dL (ref 8–23)
CO2: 27 mmol/L (ref 22–32)
Calcium: 9.2 mg/dL (ref 8.9–10.3)
Chloride: 107 mmol/L (ref 98–111)
Creatinine: 1.02 mg/dL (ref 0.61–1.24)
GFR, Estimated: >60 mL/min (ref >60)
Glucose, Bld: 96 mg/dL (ref 70–99)
Potassium: 4.9 mmol/L (ref 3.5–5.1)
Sodium: 141 mmol/L (ref 135–145)
Total Bilirubin: 1.8 mg/dL — ABNORMAL HIGH (ref 0.3–1.2)
Total Protein: 7.2 g/dL (ref 6.5–8.1)

## 2020-11-24 LAB — IRON AND TIBC
Iron: 263 ug/dL — ABNORMAL HIGH (ref 42–163)
Saturation Ratios: 95 % — ABNORMAL HIGH (ref 20–55)
TIBC: 276 ug/dL (ref 202–409)
UIBC: 14 ug/dL — ABNORMAL LOW (ref 117–376)

## 2020-11-24 LAB — FERRITIN: Ferritin: 196 ng/mL (ref 24–336)

## 2020-11-25 ENCOUNTER — Telehealth: Payer: Self-pay | Admitting: Hematology

## 2020-11-25 NOTE — Telephone Encounter (Signed)
Scheduled follow-up appointments per 3/14 los. Patient is aware. ?

## 2020-12-03 ENCOUNTER — Other Ambulatory Visit: Payer: Self-pay | Admitting: *Deleted

## 2020-12-05 ENCOUNTER — Other Ambulatory Visit: Payer: Self-pay

## 2020-12-05 ENCOUNTER — Inpatient Hospital Stay: Payer: PPO

## 2020-12-05 LAB — CMP (CANCER CENTER ONLY)
ALT: 26 U/L (ref 0–44)
AST: 25 U/L (ref 15–41)
Albumin: 4.3 g/dL (ref 3.5–5.0)
Alkaline Phosphatase: 90 U/L (ref 38–126)
Anion gap: 9 (ref 5–15)
BUN: 12 mg/dL (ref 8–23)
CO2: 24 mmol/L (ref 22–32)
Calcium: 8.8 mg/dL — ABNORMAL LOW (ref 8.9–10.3)
Chloride: 108 mmol/L (ref 98–111)
Creatinine: 0.89 mg/dL (ref 0.61–1.24)
GFR, Estimated: >60 mL/min (ref >60)
Glucose, Bld: 115 mg/dL — ABNORMAL HIGH (ref 70–99)
Potassium: 4.8 mmol/L (ref 3.5–5.1)
Sodium: 141 mmol/L (ref 135–145)
Total Bilirubin: 1.8 mg/dL — ABNORMAL HIGH (ref 0.3–1.2)
Total Protein: 7 g/dL (ref 6.5–8.1)

## 2020-12-05 LAB — CBC WITH DIFFERENTIAL (CANCER CENTER ONLY)
Abs Immature Granulocytes: 0.03 10*3/uL (ref 0.00–0.07)
Basophils Absolute: 0 10*3/uL (ref 0.0–0.1)
Basophils Relative: 0 %
Eosinophils Absolute: 0.3 10*3/uL (ref 0.0–0.5)
Eosinophils Relative: 4 %
HCT: 45.4 % (ref 39.0–52.0)
Hemoglobin: 16.1 g/dL (ref 13.0–17.0)
Immature Granulocytes: 0 %
Lymphocytes Relative: 26 %
Lymphs Abs: 1.9 10*3/uL (ref 0.7–4.0)
MCH: 32.7 pg (ref 26.0–34.0)
MCHC: 35.5 g/dL (ref 30.0–36.0)
MCV: 92.3 fL (ref 80.0–100.0)
Monocytes Absolute: 0.7 10*3/uL (ref 0.1–1.0)
Monocytes Relative: 9 %
Neutro Abs: 4.5 10*3/uL (ref 1.7–7.7)
Neutrophils Relative %: 61 %
Platelet Count: 174 10*3/uL (ref 150–400)
RBC: 4.92 MIL/uL (ref 4.22–5.81)
RDW: 13.5 % (ref 11.5–15.5)
WBC Count: 7.4 10*3/uL (ref 4.0–10.5)
nRBC: 0 % (ref 0.0–0.2)

## 2020-12-05 LAB — FERRITIN: Ferritin: 199 ng/mL (ref 24–336)

## 2020-12-05 LAB — IRON AND TIBC
Iron: 250 ug/dL — ABNORMAL HIGH (ref 42–163)
Saturation Ratios: 94 % — ABNORMAL HIGH (ref 20–55)
TIBC: 268 ug/dL (ref 202–409)
UIBC: 17 ug/dL — ABNORMAL LOW (ref 117–376)

## 2020-12-05 NOTE — Patient Instructions (Signed)
Therapeutic Phlebotomy Therapeutic phlebotomy is the planned removal of blood from a person's body for the purpose of treating a medical condition. The procedure is similar to donating blood. Usually, about a pint (470 mL, or 0.47 L) of blood is removed. The average adult has 9-12 pints (4.3-5.7 L) of blood in the body. Therapeutic phlebotomy may be used to treat the following medical conditions:  Hemochromatosis. This is a condition in which the blood contains too much iron.  Polycythemia vera. This is a condition in which the blood contains too many red blood cells.  Porphyria cutanea tarda. This is a disease in which an important part of hemoglobin is not made properly. It results in the buildup of abnormal amounts of porphyrins in the body.  Sickle cell disease. This is a condition in which the red blood cells form an abnormal crescent shape rather than a round shape. Tell a health care provider about:  Any allergies you have.  All medicines you are taking, including vitamins, herbs, eye drops, creams, and over-the-counter medicines.  Any problems you or family members have had with anesthetic medicines.  Any blood disorders you have.  Any surgeries you have had.  Any medical conditions you have.  Whether you are pregnant or may be pregnant. What are the risks? Generally, this is a safe procedure. However, problems may occur, including:  Nausea or light-headedness.  Low blood pressure (hypotension).  Soreness, bleeding, swelling, or bruising at the needle insertion site.  Infection. What happens before the procedure?  Follow instructions from your health care provider about eating or drinking restrictions.  Ask your health care provider about: ? Changing or stopping your regular medicines. This is especially important if you are taking diabetes medicines or blood thinners (anticoagulants). ? Taking medicines such as aspirin and ibuprofen. These medicines can thin your  blood. Do not take these medicines unless your health care provider tells you to take them. ? Taking over-the-counter medicines, vitamins, herbs, and supplements.  Wear clothing with sleeves that can be raised above the elbow.  Plan to have someone take you home from the hospital or clinic.  You may have a blood sample taken.  Your blood pressure, pulse rate, and breathing rate will be measured. What happens during the procedure?  To lower your risk of infection: ? Your health care team will wash or sanitize their hands. ? Your skin will be cleaned with an antiseptic.  You may be given a medicine to numb the area (local anesthetic).  A tourniquet will be placed on your arm.  A needle will be inserted into one of your veins.  Tubing and a collection bag will be attached to that needle.  Blood will flow through the needle and tubing into the collection bag.  The collection bag will be placed lower than your arm to allow gravity to help the flow of blood into the bag.  You may be asked to open and close your hand slowly and continually during the entire collection.  After the specified amount of blood has been removed from your body, the collection bag and tubing will be clamped.  The needle will be removed from your vein.  Pressure will be held on the site of the needle insertion to stop the bleeding.  A bandage (dressing) will be placed over the needle insertion site. The procedure may vary among health care providers and hospitals.   What happens after the procedure?  Your blood pressure, pulse rate, and breathing rate will   be measured after the procedure.  You will be encouraged to drink fluids.  Your recovery will be assessed and monitored.  You can return to your normal activities as told by your health care provider. Summary  Therapeutic phlebotomy is the planned removal of blood from a person's body for the purpose of treating a medical condition.  Therapeutic  phlebotomy may be used to treat hemochromatosis, polycythemia vera, porphyria cutanea tarda, or sickle cell disease.  In the procedure, a needle is inserted and about a pint (470 mL, or 0.47 L) of blood is removed. The average adult has 9-12 pints (4.3-5.7 L) of blood in the body.  This is generally a safe procedure, but it can sometimes cause problems such as nausea, light-headedness, or low blood pressure (hypotension). This information is not intended to replace advice given to you by your health care provider. Make sure you discuss any questions you have with your health care provider. Document Revised: 09/15/2017 Document Reviewed: 09/15/2017 Elsevier Patient Education  2021 Elsevier Inc.  

## 2021-03-12 DIAGNOSIS — Z85828 Personal history of other malignant neoplasm of skin: Secondary | ICD-10-CM | POA: Diagnosis not present

## 2021-03-12 DIAGNOSIS — D225 Melanocytic nevi of trunk: Secondary | ICD-10-CM | POA: Diagnosis not present

## 2021-03-12 DIAGNOSIS — D692 Other nonthrombocytopenic purpura: Secondary | ICD-10-CM | POA: Diagnosis not present

## 2021-03-12 DIAGNOSIS — L814 Other melanin hyperpigmentation: Secondary | ICD-10-CM | POA: Diagnosis not present

## 2021-03-12 DIAGNOSIS — L570 Actinic keratosis: Secondary | ICD-10-CM | POA: Diagnosis not present

## 2021-03-12 DIAGNOSIS — L821 Other seborrheic keratosis: Secondary | ICD-10-CM | POA: Diagnosis not present

## 2021-04-24 DIAGNOSIS — I10 Essential (primary) hypertension: Secondary | ICD-10-CM | POA: Diagnosis not present

## 2021-04-24 DIAGNOSIS — Z0001 Encounter for general adult medical examination with abnormal findings: Secondary | ICD-10-CM | POA: Diagnosis not present

## 2021-04-24 DIAGNOSIS — Z125 Encounter for screening for malignant neoplasm of prostate: Secondary | ICD-10-CM | POA: Diagnosis not present

## 2021-04-24 DIAGNOSIS — Z79899 Other long term (current) drug therapy: Secondary | ICD-10-CM | POA: Diagnosis not present

## 2021-04-24 DIAGNOSIS — R7309 Other abnormal glucose: Secondary | ICD-10-CM | POA: Diagnosis not present

## 2021-04-30 DIAGNOSIS — R7309 Other abnormal glucose: Secondary | ICD-10-CM | POA: Diagnosis not present

## 2021-04-30 DIAGNOSIS — I77810 Thoracic aortic ectasia: Secondary | ICD-10-CM | POA: Diagnosis not present

## 2021-04-30 DIAGNOSIS — Z0001 Encounter for general adult medical examination with abnormal findings: Secondary | ICD-10-CM | POA: Diagnosis not present

## 2021-04-30 DIAGNOSIS — I10 Essential (primary) hypertension: Secondary | ICD-10-CM | POA: Diagnosis not present

## 2021-04-30 DIAGNOSIS — I714 Abdominal aortic aneurysm, without rupture: Secondary | ICD-10-CM | POA: Diagnosis not present

## 2021-05-21 DIAGNOSIS — Z23 Encounter for immunization: Secondary | ICD-10-CM | POA: Diagnosis not present

## 2021-05-25 ENCOUNTER — Telehealth: Payer: Self-pay | Admitting: Hematology

## 2021-05-25 NOTE — Telephone Encounter (Signed)
Rescheduled upcoming appointment due to provider's template. Patient is aware of changes. 

## 2021-05-27 ENCOUNTER — Inpatient Hospital Stay: Payer: PPO

## 2021-05-27 ENCOUNTER — Inpatient Hospital Stay: Payer: PPO | Attending: Hematology

## 2021-05-27 ENCOUNTER — Other Ambulatory Visit: Payer: Self-pay

## 2021-05-27 LAB — CBC WITH DIFFERENTIAL (CANCER CENTER ONLY)
Abs Immature Granulocytes: 0.04 10*3/uL (ref 0.00–0.07)
Basophils Absolute: 0 10*3/uL (ref 0.0–0.1)
Basophils Relative: 1 %
Eosinophils Absolute: 0.2 10*3/uL (ref 0.0–0.5)
Eosinophils Relative: 3 %
HCT: 43.2 % (ref 39.0–52.0)
Hemoglobin: 15.5 g/dL (ref 13.0–17.0)
Immature Granulocytes: 1 %
Lymphocytes Relative: 27 %
Lymphs Abs: 2.1 10*3/uL (ref 0.7–4.0)
MCH: 33.3 pg (ref 26.0–34.0)
MCHC: 35.9 g/dL (ref 30.0–36.0)
MCV: 92.7 fL (ref 80.0–100.0)
Monocytes Absolute: 0.7 10*3/uL (ref 0.1–1.0)
Monocytes Relative: 10 %
Neutro Abs: 4.5 10*3/uL (ref 1.7–7.7)
Neutrophils Relative %: 58 %
Platelet Count: 176 10*3/uL (ref 150–400)
RBC: 4.66 MIL/uL (ref 4.22–5.81)
RDW: 13.5 % (ref 11.5–15.5)
WBC Count: 7.6 10*3/uL (ref 4.0–10.5)
nRBC: 0 % (ref 0.0–0.2)

## 2021-05-27 LAB — CMP (CANCER CENTER ONLY)
ALT: 28 U/L (ref 0–44)
AST: 27 U/L (ref 15–41)
Albumin: 4.2 g/dL (ref 3.5–5.0)
Alkaline Phosphatase: 72 U/L (ref 38–126)
Anion gap: 9 (ref 5–15)
BUN: 12 mg/dL (ref 8–23)
CO2: 24 mmol/L (ref 22–32)
Calcium: 9.4 mg/dL (ref 8.9–10.3)
Chloride: 108 mmol/L (ref 98–111)
Creatinine: 0.94 mg/dL (ref 0.61–1.24)
GFR, Estimated: >60 mL/min (ref >60)
Glucose, Bld: 133 mg/dL — ABNORMAL HIGH (ref 70–99)
Potassium: 5 mmol/L (ref 3.5–5.1)
Sodium: 141 mmol/L (ref 135–145)
Total Bilirubin: 1.7 mg/dL — ABNORMAL HIGH (ref 0.3–1.2)
Total Protein: 6.7 g/dL (ref 6.5–8.1)

## 2021-05-27 LAB — IRON AND TIBC
Iron: 182 ug/dL — ABNORMAL HIGH (ref 42–163)
Saturation Ratios: 70 % — ABNORMAL HIGH (ref 20–55)
TIBC: 259 ug/dL (ref 202–409)
UIBC: 77 ug/dL — ABNORMAL LOW (ref 117–376)

## 2021-05-27 LAB — FERRITIN: Ferritin: 184 ng/mL (ref 24–336)

## 2021-05-27 NOTE — Patient Instructions (Signed)

## 2021-05-27 NOTE — Progress Notes (Signed)
Phlebotomy via R antecubital with phlebotomy set.  500 gram obtained without difficulty. Pt observed for x 30 min.  Tol well.  D/c to home without assistance & in no apparent distress.

## 2021-06-05 ENCOUNTER — Other Ambulatory Visit: Payer: PPO

## 2021-07-15 DIAGNOSIS — C44722 Squamous cell carcinoma of skin of right lower limb, including hip: Secondary | ICD-10-CM | POA: Diagnosis not present

## 2021-07-15 DIAGNOSIS — Z85828 Personal history of other malignant neoplasm of skin: Secondary | ICD-10-CM | POA: Diagnosis not present

## 2021-07-31 ENCOUNTER — Other Ambulatory Visit: Payer: Self-pay

## 2021-07-31 ENCOUNTER — Ambulatory Visit: Payer: PPO | Admitting: Cardiology

## 2021-07-31 ENCOUNTER — Encounter: Payer: Self-pay | Admitting: Cardiology

## 2021-07-31 VITALS — BP 114/84 | HR 71 | Ht 68.0 in | Wt 185.0 lb

## 2021-07-31 DIAGNOSIS — Z8249 Family history of ischemic heart disease and other diseases of the circulatory system: Secondary | ICD-10-CM | POA: Diagnosis not present

## 2021-07-31 DIAGNOSIS — I77810 Thoracic aortic ectasia: Secondary | ICD-10-CM

## 2021-07-31 DIAGNOSIS — I10 Essential (primary) hypertension: Secondary | ICD-10-CM | POA: Diagnosis not present

## 2021-07-31 DIAGNOSIS — E785 Hyperlipidemia, unspecified: Secondary | ICD-10-CM

## 2021-07-31 MED ORDER — ROSUVASTATIN CALCIUM 5 MG PO TABS: 5.0000 mg | ORAL_TABLET | Freq: Every day | ORAL | 3 refills | Status: DC

## 2021-07-31 NOTE — Patient Instructions (Addendum)
Medication Instructions:  Your physician has recommended you make the following change in your medication:  START Crestor (Rosuvastatin) 5 mg taking 1 daily  START Fish Oil 4 grams daily over the counter    *If you need a refill on your cardiac medications before your next appointment, please call your pharmacy*   Lab Work: 09/11/2021: COME TO THE OFFICE FOR FASTIN BLOOD WORK, NOTHING TO EAT OR DRINK AFTER MIDNIGHT THE NIGHT BEFORE FOR:  LIPID & ALT  If you have labs (blood work) drawn today and your tests are completely normal, you will receive your results only by: [place] (if you have MyChart) OR A paper copy in the mail If you have any lab test that is abnormal or we need to change your treatment, we will call you to review the results.   Testing/Procedures: Your physician has requested that you have an limited echocardiogram after 12/17. Echocardiography is a painless test that uses sound waves to create images of your heart. It provides your doctor with information about the size and shape of your heart and how well your heart's chambers and valves are working. This procedure takes approximately one hour. There are no restrictions for this procedure.    Follow-Up: At [facility], you and your health needs are our priority.  As part of our continuing mission to provide you with exceptional heart care, we have created designated Provider Care Teams.  These Care Teams include your primary Cardiologist (physician) and Advanced Practice Providers (APPs -  Physician Assistants and Nurse Practitioners) who all work together to provide you with the care you need, when you need it.  We recommend signing up for the patient portal called "MyChart".  Sign up information is provided on this After Visit Summary.  MyChart is used to connect with patients for Virtual Visits (Telemedicine).  Patients are able to view lab/test results, encounter notes, upcoming appointments, etc.  Non-urgent  messages can be sent to your provider as well.   To learn more about what you can do with MyChart, go to [email or web address].    Your next appointment:   12 month(s)  The format for your next appointment:   In Person  Provider:   Fransico Him, MD     Other Instructions

## 2021-07-31 NOTE — Addendum Note (Signed)
Addended by: Gaetano Net on: 07/31/2021 10:27 AM   Modules accepted: Orders

## 2021-07-31 NOTE — Progress Notes (Signed)
Cardiology Office Note:    Date:  07/31/2021   ID:  Alexander Pollard, DOB [birth year], MRN [id number]  PCP:  Lujean Amel, MD  Cardiologist:  Fransico Him, MD    Referring MD: Lujean Amel, MD   Chief Complaint  Patient presents with   Coronary Artery Disease   Hypertension   Hyperlipidemia     History of Present Illness:    Alexander Pollard is a 70 y.o. male with a hx of hemachromatosis, mildly dilated aortic root, diastolic dysfunction and family history of CAD.  He underwent ETT 06/2015 due to atypical chest pain - had hypertensive response to exercise up to 195/107, prompting addition of Toprol. Nuc 07/2015 was normal, EF 60%. Ambulatory BP monitor 07/2015 was normal.  2D echo 04/10/2018 normal LV function with mild grade 1 DD and mildly dilated ascending aorta.  He is here today for followup and is doing well.  HE denies any chest pain or pressure, SOB, DOE, PND, orthopnea, LE edema, dizziness, palpitations or syncope. He is compliant with his meds and is tolerating meds with no SE.      Past Medical History:  Diagnosis Date   Ascending aortic aneurysm    80mm on chest CT 08/2020   Dyslipidemia    a. h/o low HDL.   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 Marked hypertensive response to exercise by ETT 06/2016 prompting addition of Toprol. b. F/u amb BP monitor normal.    Past Surgical History:  Procedure Laterality Date   COLONOSCOPY     next due 08/2017   IR THORACENTESIS ASP PLEURAL SPACE W/IMG GUIDE  01/16/2018   IR THORACENTESIS ASP PLEURAL SPACE W/IMG GUIDE  01/18/2018   LAPAROTOMY N/A 01/09/2018   Procedure: EXPLORATORY LAPAROTOMY, RIGHT COLECTOMY, PLACEMENT OF WOUND VAC TO ABDOMEN, DRAINAGE OF RETROPERITONEAL ABSCESS;  Surgeon: Rolm Bookbinder, MD;  Location: [place];  Service: General;  Laterality: N/A;   LAPAROTOMY N/A 01/17/2018   Procedure:  EXPLORATORY LAPAROTOMY;  Surgeon: Donnie Mesa, MD;  Location: [place];  Service: General;  Laterality: N/A;    Current Medications: Current Meds  Medication Sig   aspirin EC 81 MG tablet Take 81 mg by mouth daily.   metoprolol succinate (TOPROL-XL) 25 MG 24 hr tablet TAKE 1 TABLET BY MOUTH EVERY DAY     Allergies:   Patient has no known allergies.   Social History   Socioeconomic History   Marital status: Divorced    Spouse name: Not on file   Number of children: Not on file   Years of education: Not on file   Highest education level: Not on file  Occupational History   Not on file  Tobacco Use   Smoking status: Former   Smokeless tobacco: Never  Vaping Use   Vaping Use: Never used  Substance and Sexual Activity   Alcohol use: Yes    Alcohol/week: 0.0 standard drinks    Comment: Beer weekly.   Drug use: No   Sexual activity: Not on file  Other Topics Concern   Not on file  Social History Narrative   Not on file   Social Determinants of Health   Financial Resource Strain: Not on file  Food Insecurity: Not on file  Transportation Needs: Not on file  Physical Activity: Not on file  Stress: Not on file  Social Connections:  Not on file     Family History: The patient's family history includes Heart attack in his mother; Heart disease in his brother and mother; Leukemia in his brother; Lymphoma in his brother.  ROS:   Please see the history of present illness.    ROS  All other systems reviewed and negative.   EKGs/Labs/Other Studies Reviewed:    The following studies were reviewed today: none  EKG:  EKG is  ordered today.  The ekg ordered today demonstrates NSR with inferior infarct unchanged from 07/2020  Recent Labs: 05/27/2021: ALT 28; BUN 12; Creatinine 0.94; Hemoglobin 15.5; Platelet Count 176; Potassium 5.0; Sodium 141   Recent Lipid Panel    Component Value Date/Time   CHOL 105 09/08/2020 0727   TRIG 103 09/08/2020 0727   HDL 29 (L) 09/08/2020  0727   CHOLHDL 3.6 09/08/2020 0727   LDLCALC 56 09/08/2020 0727    Physical Exam:    VS:  BP 114/84   Pulse 71   Ht 5\' 8"  (1.727 m)   Wt 185 lb (83.9 kg)   SpO2 98%   BMI 28.13 kg/m     Wt Readings from Last 3 Encounters:  07/31/21 185 lb (83.9 kg)  11/24/20 182 lb 8 oz (82.8 kg)  08/01/20 185 lb (83.9 kg)     GEN: Well nourished, well developed in no acute distress HEENT: Normal NECK: No JVD; No carotid bruits LYMPHATICS: No lymphadenopathy CARDIAC:RRR, no murmurs, rubs, gallops RESPIRATORY:  Clear to auscultation without rales, wheezing or rhonchi  ABDOMEN: Soft, non-tender, non-distended MUSCULOSKELETAL:  No edema; No deformity  SKIN: Warm and dry NEUROLOGIC:  Alert and oriented x 3 PSYCHIATRIC:  Normal affect    ASSESSMENT:    1. Essential hypertension   2. Dyslipidemia   3. Family history of early CAD   1. Dilated aortic root (HCC)    PLAN:    In order of problems listed above:  1.  HTN -His BP is adequately controlled on exam today. -Continue prescription drug management with Toprol-XL 25 mg daily with as needed refills  2.  HLD -LDL goal < 70 due to coronary calcifications -I have personally reviewed and interpreted outside labs performed by patient's PCP which showed LDL 62, HDL 31, triglycerides 87, ALT 28 on 04/24/2021 and 05/27/2021 -Currently is not on statin therapy -start Crestor 5mg  daily given coronary calcifications and low HDL and start Fish oil 4gm daily -check FLP and ALT in 6 weeks -encouraged him to exercise 45 minutes of aerobic 5 days weekly  3.  Coronary artery calcifications/Fm Hx of premature CAD -ETT in 2016 with no ischemia -Coronary calcium score in 2021 it was 83 -He denies any anginal symptoms -Continue statin therapy  4.  Dilated ascending aorta/aortic atherosclerosis -2D echo 08/2019 showed mildly dilated ascending aorta at 20mm -repeat 2D echo in 08/2020 showed dilated ascending aorta at 40 mm as well we will repeat  echo -encouraged him to avoid upper body weight lifting more than 10lbs  -encouraged him to do aerobic exercise  5.  Hereditary hemochromatosis -followed by Dr. Beryle Beams and managed with periodic phlebotomies   Medication Adjustments/Labs and Tests Ordered: Current medicines are reviewed at length with the patient today.  Concerns regarding medicines are outlined above.  Orders Placed This Encounter  Procedures   EKG 12-Lead    No orders of the defined types were placed in this encounter.   Signed, Fransico Him, MD  07/31/2021 10:08 AM    [place]  [facility]

## 2021-08-26 ENCOUNTER — Other Ambulatory Visit: Payer: Self-pay | Admitting: Cardiology

## 2021-08-26 DIAGNOSIS — I10 Essential (primary) hypertension: Secondary | ICD-10-CM

## 2021-08-29 ENCOUNTER — Encounter (HOSPITAL_COMMUNITY): Payer: Self-pay

## 2021-08-29 ENCOUNTER — Other Ambulatory Visit: Payer: Self-pay

## 2021-08-29 ENCOUNTER — Ambulatory Visit (HOSPITAL_COMMUNITY)
Admission: EM | Admit: 2021-08-29 | Discharge: 2021-08-29 | Disposition: A | Discharge status: Home / Self Care | Payer: PPO | Attending: Student | Admitting: Student

## 2021-08-29 DIAGNOSIS — J069 Acute upper respiratory infection, unspecified: Secondary | ICD-10-CM | POA: Diagnosis not present

## 2021-08-29 DIAGNOSIS — Z1152 Encounter for screening for COVID-19: Secondary | ICD-10-CM | POA: Insufficient documentation

## 2021-08-29 NOTE — Discharge Instructions (Addendum)
-  With a virus, you're typically contagious for 5-7 days, or as long as you're having fevers.

## 2021-08-29 NOTE — ED Triage Notes (Signed)
Pt reports cough and runny nose x 1 we.   Pt requested COVID test.

## 2021-08-29 NOTE — ED Provider Notes (Addendum)
[place]    CSN: [id number] Arrival date & time: 08/29/21  1218      History   Chief Complaint Chief Complaint  Patient presents with   Cough    HPI Alexander Pollard is a 70 y.o. male presenting with viral syndrome x7 days, slowly improving on its own. Cough is nonproductive and worse at night. Denis SOB, CP, dizziness, weakness, fevers/chills. Denies history pulm ds. Is requesting rapid covid test.  HPI  Past Medical History:  Diagnosis Date   Ascending aortic aneurysm    64mm on chest CT 08/2020   Dyslipidemia    a. h/o low HDL.   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 Marked hypertensive response to exercise by ETT 06/2016 prompting addition of Toprol. b. F/u amb BP monitor normal.    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   Acute perforated appendicitis s/p right colectomy 01/10/2018 01/10/2018   Enlarged aorta ([place])    Hypertensive response to exercise    Dyslipidemia 05/28/2014   Dilated aortic root ([place]) 05/28/2014   Family history of early CAD 05/10/2014   Hemochromatosis 03/12/2014    Past Surgical History:  Procedure Laterality Date   COLONOSCOPY     next due 08/2017   IR THORACENTESIS ASP PLEURAL SPACE W/IMG GUIDE  01/16/2018   IR THORACENTESIS ASP PLEURAL SPACE W/IMG GUIDE  01/18/2018   LAPAROTOMY N/A 01/09/2018   Procedure: EXPLORATORY LAPAROTOMY, RIGHT COLECTOMY, PLACEMENT OF WOUND VAC TO ABDOMEN, DRAINAGE OF RETROPERITONEAL ABSCESS;  Surgeon: Rolm Bookbinder, MD;  Location: [facility];  Service: General;  Laterality: N/A;   LAPAROTOMY N/A 01/17/2018   Procedure: EXPLORATORY LAPAROTOMY;  Surgeon: Donnie Mesa, MD;   Location: [facility];  Service: General;  Laterality: N/A;       Home Medications    Prior to Admission medications   Medication Sig Start Date End Date Taking? Authorizing Provider  aspirin EC 81 MG tablet Take 81 mg by mouth daily.    [provider]  metoprolol succinate (TOPROL-XL) 25 MG 24 hr tablet TAKE 1 TABLET BY MOUTH EVERY DAY 08/26/21   Sueanne Margarita, MD  rosuvastatin (CRESTOR) 5 MG tablet Take 1 tablet (5 mg total) by mouth daily. 07/31/21 10/29/21  Sueanne Margarita, MD    Family History Family History  Problem Relation Age of Onset   Heart attack Mother    Heart disease Mother    Leukemia Brother    Lymphoma Brother    Heart disease Brother     Social History Social History   Tobacco Use   Smoking status: Former   Smokeless tobacco: Never  Scientific laboratory technician Use: Never used  Substance Use Topics   Alcohol use: Yes    Alcohol/week: 0.0 standard drinks    Comment: Beer weekly.   Drug use: No     Allergies   Venlafaxine   Review of Systems Review of Systems  Constitutional:  Negative for appetite change, chills and fever.  HENT:  Positive for congestion. Negative for ear pain, rhinorrhea, sinus pressure, sinus pain and sore throat.   Eyes:  Negative for redness and visual disturbance.  Respiratory:  Positive for cough. Negative for chest tightness, shortness of breath and wheezing.   Cardiovascular:  Negative for chest pain and palpitations.  Gastrointestinal:  Negative for abdominal pain, constipation, diarrhea, nausea and vomiting.  Genitourinary:  Negative for dysuria, frequency and urgency.  Musculoskeletal:  Negative for myalgias.  Neurological:  Negative for dizziness, weakness and headaches.  Psychiatric/Behavioral:  Negative for confusion.   All other systems reviewed and are negative.   Physical Exam Triage Vital Signs ED Triage Vitals  Enc Vitals Group     BP 08/29/21 1324 (!) 153/92     Pulse Rate 08/29/21 1324 82     Resp  08/29/21 1324 20     Temp 08/29/21 1324 98.8 F (37.1 C)     Temp Source 08/29/21 1324 Oral     SpO2 08/29/21 1324 97 %     Weight --      Height --      Head Circumference --      Peak Flow --      Pain Score 08/29/21 1323 0     Pain Loc --      Pain Edu? --      Excl. in [place]? --    No data found.  Updated Vital Signs BP (!) 153/92 (BP Location: Right Arm)    Pulse 82    Temp 98.8 F (37.1 C) (Oral)    Resp 20    SpO2 97%   Visual Acuity Right Eye Distance:   Left Eye Distance:   Bilateral Distance:    Right Eye Near:   Left Eye Near:    Bilateral Near:     Physical Exam Vitals reviewed.  Constitutional:      General: He is not in acute distress.    Appearance: Normal appearance. He is not ill-appearing.  HENT:     Head: Normocephalic and atraumatic.     Right Ear: Tympanic membrane, ear canal and external ear normal. No tenderness. No middle ear effusion. There is no impacted cerumen. Tympanic membrane is not perforated, erythematous, retracted or bulging.     Left Ear: Tympanic membrane, ear canal and external ear normal. No tenderness.  No middle ear effusion. There is no impacted cerumen. Tympanic membrane is not perforated, erythematous, retracted or bulging.     Nose: Nose normal. No congestion.     Mouth/Throat:     Mouth: Mucous membranes are moist.     Pharynx: Uvula midline. No oropharyngeal exudate or posterior oropharyngeal erythema.  Eyes:     Extraocular Movements: Extraocular movements intact.     Pupils: Pupils are equal, round, and reactive to light.  Cardiovascular:     Rate and Rhythm: Normal rate and regular rhythm.     Heart sounds: Normal heart sounds.  Pulmonary:     Effort: Pulmonary effort is normal.     Breath sounds: Normal breath sounds. No decreased breath sounds, wheezing, rhonchi or rales.  Abdominal:     Palpations: Abdomen is soft.     Tenderness: There is no abdominal tenderness. There is no guarding or rebound.  Lymphadenopathy:      Cervical: No cervical adenopathy.     Right cervical: No superficial cervical adenopathy.    Left cervical: No superficial cervical adenopathy.  Neurological:     General: No focal deficit present.     Mental Status: He is alert and oriented to person, place, and time.  Psychiatric:        Mood and Affect: Mood is anxious.  Behavior: Behavior normal.        Thought Content: Thought content normal.        Judgment: Judgment normal.     UC Treatments / Results  Labs (all labs ordered are listed, but only abnormal results are displayed) Labs Reviewed  SARS CORONAVIRUS 2 (TAT 6-24 HRS)    EKG   Radiology No results found.  Procedures Procedures (including critical care time)  Medications Ordered in UC Medications - No data to display  Initial Impression / Assessment and Plan / UC Course  I have reviewed the triage vital signs and the nursing notes.  Pertinent labs & imaging results that were available during my care of the patient were reviewed by me and considered in my medical decision making (see chart for details).     This patient is a very pleasant 70 y.o. year old male presenting with viral URI x7 days, improving on its own. Today this pt is afebrile nontachycardic nontachypneic, oxygenating well on room air, no wheezes rhonchi or rales. Denies history cardiopulm ds.   Covid PCR sent. Patient dismayed that we do not have rapid covid testing. Discussed that he could perform a home covid test while awaiting results. As he has been symptomatic x7 days and is not having fevers, he would not be contagious anymore. He is out of the antiviral window. He is fully vaccinated for covid-19.  ED return precautions discussed. Patient verbalizes understanding and agreement.     Final Clinical Impressions(s) / UC Diagnoses   Final diagnoses:  Viral URI with cough  Encounter for screening for COVID-19     Discharge Instructions      -With a virus, you're  typically contagious for 5-7 days, or as long as you're having fevers.     ED Prescriptions   None    PDMP not reviewed this encounter.   Hazel Sams, PA-C 08/29/21 1405    Hazel Sams, PA-C 08/30/21 1003

## 2021-08-30 LAB — SARS CORONAVIRUS 2 (TAT 6-24 HRS): SARS Coronavirus 2: POSITIVE — AB

## 2021-08-31 ENCOUNTER — Other Ambulatory Visit (HOSPITAL_COMMUNITY): Payer: PPO

## 2021-08-31 ENCOUNTER — Encounter (HOSPITAL_COMMUNITY): Payer: Self-pay

## 2021-08-31 ENCOUNTER — Encounter (HOSPITAL_COMMUNITY): Payer: Self-pay | Admitting: Cardiology

## 2021-09-11 ENCOUNTER — Other Ambulatory Visit: Payer: Self-pay | Admitting: *Deleted

## 2021-09-11 ENCOUNTER — Other Ambulatory Visit: Payer: PPO | Admitting: *Deleted

## 2021-09-11 ENCOUNTER — Other Ambulatory Visit: Payer: PPO

## 2021-09-11 DIAGNOSIS — I77810 Thoracic aortic ectasia: Secondary | ICD-10-CM | POA: Diagnosis not present

## 2021-09-11 DIAGNOSIS — E785 Hyperlipidemia, unspecified: Secondary | ICD-10-CM | POA: Diagnosis not present

## 2021-09-11 DIAGNOSIS — I10 Essential (primary) hypertension: Secondary | ICD-10-CM

## 2021-09-11 DIAGNOSIS — Z8249 Family history of ischemic heart disease and other diseases of the circulatory system: Secondary | ICD-10-CM | POA: Diagnosis not present

## 2021-09-11 LAB — LIPID PANEL
Chol/HDL Ratio: 2.6 ratio (ref 0.0–5.0)
Cholesterol, Total: 77 mg/dL — ABNORMAL LOW (ref 100–199)
HDL: 30 mg/dL — ABNORMAL LOW (ref >39)
LDL Chol Calc (NIH): 27 mg/dL (ref 0–99)
Triglycerides: 105 mg/dL (ref 0–149)
VLDL Cholesterol Cal: 20 mg/dL (ref 5–40)

## 2021-09-11 LAB — ALT: ALT: 28 IU/L (ref 0–44)

## 2021-09-18 ENCOUNTER — Ambulatory Visit (HOSPITAL_COMMUNITY): Payer: PPO | Attending: Cardiology

## 2021-09-18 ENCOUNTER — Other Ambulatory Visit: Payer: Self-pay

## 2021-09-18 DIAGNOSIS — I700 Atherosclerosis of aorta: Secondary | ICD-10-CM | POA: Insufficient documentation

## 2021-09-18 DIAGNOSIS — I5022 Chronic systolic (congestive) heart failure: Secondary | ICD-10-CM | POA: Diagnosis not present

## 2021-09-18 DIAGNOSIS — E785 Hyperlipidemia, unspecified: Secondary | ICD-10-CM | POA: Diagnosis not present

## 2021-09-18 DIAGNOSIS — I77810 Thoracic aortic ectasia: Secondary | ICD-10-CM | POA: Diagnosis not present

## 2021-09-18 LAB — ECHOCARDIOGRAM LIMITED
Area-P 1/2: 3.08 cm2
S' Lateral: 2.6 cm

## 2021-09-20 ENCOUNTER — Encounter: Payer: Self-pay | Admitting: Cardiology

## 2021-09-23 ENCOUNTER — Other Ambulatory Visit: Payer: Self-pay | Admitting: Family Medicine

## 2021-09-23 DIAGNOSIS — I714 Abdominal aortic aneurysm, without rupture, unspecified: Secondary | ICD-10-CM

## 2021-09-30 ENCOUNTER — Encounter: Payer: Self-pay | Admitting: Hematology

## 2021-10-02 ENCOUNTER — Encounter: Payer: Self-pay | Admitting: Podiatry

## 2021-10-02 ENCOUNTER — Ambulatory Visit (INDEPENDENT_AMBULATORY_CARE_PROVIDER_SITE_OTHER): Payer: PPO | Admitting: Podiatry

## 2021-10-02 ENCOUNTER — Other Ambulatory Visit: Payer: Self-pay

## 2021-10-02 ENCOUNTER — Encounter: Payer: Self-pay | Admitting: Hematology

## 2021-10-02 DIAGNOSIS — R7301 Impaired fasting glucose: Secondary | ICD-10-CM | POA: Insufficient documentation

## 2021-10-02 DIAGNOSIS — M2041 Other hammer toe(s) (acquired), right foot: Secondary | ICD-10-CM

## 2021-10-02 DIAGNOSIS — I10 Essential (primary) hypertension: Secondary | ICD-10-CM | POA: Insufficient documentation

## 2021-10-02 DIAGNOSIS — M2012 Hallux valgus (acquired), left foot: Secondary | ICD-10-CM

## 2021-10-02 DIAGNOSIS — M779 Enthesopathy, unspecified: Secondary | ICD-10-CM

## 2021-10-02 DIAGNOSIS — M2042 Other hammer toe(s) (acquired), left foot: Secondary | ICD-10-CM | POA: Diagnosis not present

## 2021-10-02 DIAGNOSIS — M2011 Hallux valgus (acquired), right foot: Secondary | ICD-10-CM

## 2021-10-02 DIAGNOSIS — I714 Abdominal aortic aneurysm, without rupture, unspecified: Secondary | ICD-10-CM | POA: Insufficient documentation

## 2021-10-02 NOTE — Progress Notes (Signed)
This patient presents to the office for a foot exam.  He says he has been an avid runner for years and he has developed foot  deformities. He says he is having  intermittant pain through the left forefoot.   He presents to the office for a foot evaluation.     Vascular  Dorsalis pedis and posterior tibial pulses are palpable  B/L.  Capillary return  WNL.  Temperature gradient is  WNL.  Skin turgor  WNL  Sensorium  Senn Weinstein monofilament wire  WNL. Normal tactile sensation.  Nail Exam  Patient has normal nails with thickened nails noted both feet.  Pincer nail 3rd toenail left foot..  Orthopedic  Exam  Muscle tone and muscle strength  WNL.  No limitations of motion feet  B/L.  No crepitus or joint effusion noted.  Foot type is unremarkable and digits show no abnormalities.  HAV  B/L.  Hammer toes  B/l.    Tailors bunions  B/L.  Excessive pronation upon weight bearing.  Skin  No open lesions.  Normal skin texture and turgor.  Capsulitis 3rd metatarsal left foot.  HAV  B/L  Hammer toes  B/L.  Tailors Bunion  B/L  ROV.  Discussed this condition with this patient. Told him his fat pad is not present under his third metatarsal left foot.  This accounts for his left forefoot pain.  RTC prn.   Gardiner Barefoot DPM

## 2021-10-09 ENCOUNTER — Ambulatory Visit
Admission: RE | Admit: 2021-10-09 | Discharge: 2021-10-09 | Disposition: A | Discharge status: Home / Self Care | Payer: PPO | Source: Ambulatory Visit | Attending: Family Medicine | Admitting: Family Medicine

## 2021-10-09 DIAGNOSIS — I77811 Abdominal aortic ectasia: Secondary | ICD-10-CM | POA: Diagnosis not present

## 2021-10-09 DIAGNOSIS — I714 Abdominal aortic aneurysm, without rupture, unspecified: Secondary | ICD-10-CM

## 2021-10-19 DIAGNOSIS — M25551 Pain in right hip: Secondary | ICD-10-CM | POA: Diagnosis not present

## 2021-11-24 ENCOUNTER — Other Ambulatory Visit: Payer: PPO

## 2021-11-24 ENCOUNTER — Ambulatory Visit: Payer: PPO | Admitting: Hematology

## 2021-12-02 ENCOUNTER — Other Ambulatory Visit: Payer: Self-pay

## 2021-12-04 ENCOUNTER — Other Ambulatory Visit: Payer: PPO

## 2021-12-04 ENCOUNTER — Other Ambulatory Visit: Payer: Self-pay

## 2021-12-04 ENCOUNTER — Inpatient Hospital Stay: Payer: PPO

## 2021-12-04 ENCOUNTER — Inpatient Hospital Stay: Payer: PPO | Attending: Hematology

## 2021-12-04 ENCOUNTER — Ambulatory Visit: Payer: PPO | Admitting: Hematology

## 2021-12-04 ENCOUNTER — Inpatient Hospital Stay: Payer: PPO | Admitting: Hematology

## 2021-12-04 LAB — CMP (CANCER CENTER ONLY)
ALT: 32 U/L (ref 0–44)
AST: 29 U/L (ref 15–41)
Albumin: 4.4 g/dL (ref 3.5–5.0)
Alkaline Phosphatase: 76 U/L (ref 38–126)
Anion gap: 5 (ref 5–15)
BUN: 14 mg/dL (ref 8–23)
CO2: 28 mmol/L (ref 22–32)
Calcium: 9.7 mg/dL (ref 8.9–10.3)
Chloride: 106 mmol/L (ref 98–111)
Creatinine: 0.92 mg/dL (ref 0.61–1.24)
GFR, Estimated: >60 mL/min (ref >60)
Glucose, Bld: 129 mg/dL — ABNORMAL HIGH (ref 70–99)
Potassium: 4.7 mmol/L (ref 3.5–5.1)
Sodium: 139 mmol/L (ref 135–145)
Total Bilirubin: 2.2 mg/dL — ABNORMAL HIGH (ref 0.3–1.2)
Total Protein: 7 g/dL (ref 6.5–8.1)

## 2021-12-04 LAB — CBC WITH DIFFERENTIAL (CANCER CENTER ONLY)
Abs Immature Granulocytes: 0.03 10*3/uL (ref 0.00–0.07)
Basophils Absolute: 0.1 10*3/uL (ref 0.0–0.1)
Basophils Relative: 1 %
Eosinophils Absolute: 0.3 10*3/uL (ref 0.0–0.5)
Eosinophils Relative: 3 %
HCT: 42.9 % (ref 39.0–52.0)
Hemoglobin: 15.7 g/dL (ref 13.0–17.0)
Immature Granulocytes: 0 %
Lymphocytes Relative: 21 %
Lymphs Abs: 1.7 10*3/uL (ref 0.7–4.0)
MCH: 33.3 pg (ref 26.0–34.0)
MCHC: 36.6 g/dL — ABNORMAL HIGH (ref 30.0–36.0)
MCV: 90.9 fL (ref 80.0–100.0)
Monocytes Absolute: 0.7 10*3/uL (ref 0.1–1.0)
Monocytes Relative: 9 %
Neutro Abs: 5.3 10*3/uL (ref 1.7–7.7)
Neutrophils Relative %: 66 %
Platelet Count: 170 10*3/uL (ref 150–400)
RBC: 4.72 MIL/uL (ref 4.22–5.81)
RDW: 13.3 % (ref 11.5–15.5)
WBC Count: 8.1 10*3/uL (ref 4.0–10.5)
nRBC: 0 % (ref 0.0–0.2)

## 2021-12-04 LAB — IRON AND IRON BINDING CAPACITY (CC-WL,HP ONLY)
Iron: 267 ug/dL — ABNORMAL HIGH (ref 45–182)
Saturation Ratios: 87 % — ABNORMAL HIGH (ref 17.9–39.5)
TIBC: 308 ug/dL (ref 250–450)
UIBC: 41 ug/dL — ABNORMAL LOW (ref 117–376)

## 2021-12-04 LAB — FERRITIN: Ferritin: 248 ng/mL (ref 24–336)

## 2021-12-04 NOTE — Patient Instructions (Signed)

## 2021-12-04 NOTE — Progress Notes (Signed)
Per Dr. Irene Limbo, ok for phlebotomy today with Hemoglobin 15.7 and Ferritin pending.  ? ?Alexander Pollard presents today for phlebotomy per MD orders. ?Phlebotomy procedure started at 1029 and ended at 1034 ?513 grams removed. ?Patient observed for 30 minutes after procedure without any incident. ?Patient tolerated procedure well. ?IV needle removed intact. Food and fluids given. Pt. stayed for 20 minutes post observation, declined to stay for full 30 minutes. Stable at discharge. Left via ambulation, no respiratory distress noted. ? ? ?

## 2021-12-10 ENCOUNTER — Encounter: Payer: Self-pay | Admitting: Hematology

## 2021-12-10 NOTE — Progress Notes (Signed)
? ? ?HEMATOLOGY/ONCOLOGY CLINIC NOTE ? ?Date of Service: 12/04/2021 ? ? ?Patient Care Team: ?Lujean Amel, MD as PCP - General (Family Medicine) ?Sueanne Margarita, MD as PCP - Cardiology (Cardiology) ?[name], MD as Consulting Physician (Oncology) ?Wilford Corner, MD as Consulting Physician (Gastroenterology) ?Sueanne Margarita, MD as Consulting Physician (Cardiology) ?[name], MD as Consulting Physician (Oncology) ? ?CHIEF COMPLAINTS/PURPOSE OF CONSULTATION:  ?Follow-up for continued evaluation and management of hereditary hemochromatosis ? ?HISTORY OF PRESENTING ILLNESS:  ? ?Alexander Pollard is a wonderful 71 y.o. male who has been referred to [place] by Dr. Murriel Hopper for evaluation and management of Hereditary Hemochromatosis. The pt reports that he is doing well overall.  ? 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 Initial workup was significatn for mild liver function abnormalities, serum iron at 264 with a 97% saturation and a Ferritin of 1498. A 01/16/14 CT A/P was obtained which revealed overall normal appearance of the liver with splenomegaly of 16cm, which was then seen to decrease to 13cm on 05/28/14 US Abdomen, and fibrosis noted. He began regular phlebotomies in September 2015, but then due to a "secretarial error," was lost to follow up between January 2017 and October 2019, and subsequently resumed regular phlebotomies in October 2019. ? ?The pt reports that he had elevated liver functions for a few years prior to his initial diagnosis in April 2015. He denies family history of early heart or liver disease. The pt's brother also has hereditary hemochromatosis. ? ?The pt notes that his last phlebotomy was in January of February. He denies any problems tolerating his therapeutic phlebotomies. He notes that he did not experience a worsened symptomatic difference in the 2.5 year interim in which he was lost to  follow up. ? ?Most recent lab results: 10/31/18 HGB at 15.7 ?09/05/18 Ferritin at 156 ? ?On review of systems, pt reports good energy levels, staying active, eating well,  and denies joint pains or swellings, abdominal pains, leg swelling, and any other symptoms.  ? ?On PMHx the pt reports appendectomy in May 2019 denies liver or heart disease. ?On Family Hx the pt reports brother with hereditary hemochromatosis ? ?Interval History ? ?Alexander Pollard is a 71 y.o. male here for continued valuation and management of hereditary hemochromatosis.  His last clinic visit with [place] was about 1 year ago on 11/24/2020.  He continues to have therapeutic phlebotomies every 6 months..\ ?He notes no issues with the therapeutic phlebotomy. ?Patient reports no new bone pains.  No blistering sunburns. ?Labs done today were reviewed in detail with the patient. ? ? ?MEDICAL HISTORY:  ?Past Medical History:  ?Diagnosis Date  ? Ascending aortic aneurysm   ? 68m on chest CT 08/2020 and echo 09/18/2020  ? Dyslipidemia   ? a. h/o low HDL.  ? Family history of early CAD   ? Hemochromatosis 03/12/2014  ? 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 Hypertensive response to exercise   ? a. Marked hypertensive response to exercise by ETT 06/2016 prompting addition of Toprol. b. F/u amb BP monitor normal.  ? ? ?SURGICAL HISTORY: ?Past Surgical History:  ?Procedure Laterality Date  ? COLONOSCOPY    ? next due 08/2017  ? IR THORACENTESIS ASP PLEURAL SPACE W/IMG GUIDE  01/16/2018  ? IR THORACENTESIS ASP PLEURAL SPACE W/IMG GUIDE  01/18/2018  ? LAPAROTOMY N/A 01/09/2018  ? Procedure: EXPLORATORY LAPAROTOMY, RIGHT COLECTOMY, PLACEMENT OF  WOUND VAC TO ABDOMEN, DRAINAGE OF RETROPERITONEAL ABSCESS;  Surgeon: Rolm Bookbinder, MD;  Location: [facility];  Service: General;  Laterality: N/A;  ? LAPAROTOMY N/A 01/17/2018  ? Procedure: EXPLORATORY LAPAROTOMY;  Surgeon: Donnie Mesa, MD;  Location: [facility];  Service: General;  Laterality: N/A;   ? ? ?SOCIAL HISTORY: ?Social History  ? ?Socioeconomic History  ? Marital status: Divorced  ?  Spouse name: Not on file  ? Number of children: Not on file  ? Years of education: Not on file  ? Highest education level: Not on file  ?Occupational History  ? Not on file  ?Tobacco Use  ? Smoking status: Former  ? Smokeless tobacco: Never  ?Vaping Use  ? Vaping Use: Never used  ?Substance and Sexual Activity  ? Alcohol use: Yes  ?  Alcohol/week: 0.0 standard drinks  ?  Comment: Beer weekly.  ? Drug use: No  ? Sexual activity: Not on file  ?Other Topics Concern  ? Not on file  ?Social History Narrative  ? Not on file  ? ?Social Determinants of Health  ? ?Financial Resource Strain: Not on file  ?Food Insecurity: Not on file  ?Transportation Needs: Not on file  ?Physical Activity: Not on file  ?Stress: Not on file  ?Social Connections: Not on file  ?Intimate Partner Violence: Not on file  ? ? ?FAMILY HISTORY: ?Family History  ?Problem Relation Age of Onset  ? Heart attack Mother   ? Heart disease Mother   ? Leukemia Brother   ? Lymphoma Brother   ? Heart disease Brother   ? ? ?ALLERGIES:  is allergic to venlafaxine. ? ?MEDICATIONS:  ?Current Outpatient Medications  ?Medication Sig Dispense Refill  ? aspirin EC 81 MG tablet Take 81 mg by mouth daily.    ? metoprolol succinate (TOPROL-XL) 25 MG 24 hr tablet TAKE 1 TABLET BY MOUTH EVERY DAY 90 tablet 3  ? PFIZER COVID-19 VAC BIVALENT injection     ? rosuvastatin (CRESTOR) 5 MG tablet Take 1 tablet (5 mg total) by mouth daily. 90 tablet 3  ? ?No current facility-administered medications for this visit.  ? ? ?REVIEW OF SYSTEMS:   ?10 Point review of Systems was done is negative except as noted above. ? ?PHYSICAL EXAMINATION: ? ?Vitals:  ? 12/04/21 0930  ?BP: 118/71  ?Pulse: 68  ?Resp: 20  ?Temp: 98.1 ?F (36.7 ?C)  ?SpO2: 99%  ? ?Filed Weights  ? 12/04/21 0930  ?Weight: 188 lb 3.2 oz (85.4 kg)  ? ?.Body mass index is 28.62 kg/m?Marland Kitchen  ?NAD ?GENERAL:alert, in no acute distress and  comfortable ?SKIN: no acute rashes, no significant lesions ?EYES: conjunctiva are pink and non-injected, sclera anicteric ?OROPHARYNX: MMM, no exudates, no oropharyngeal erythema or ulceration ?NECK: supple, no JVD ?LYMPH:  no palpable lymphadenopathy in the cervical, axillary or inguinal regions ?LUNGS: clear to auscultation b/l with normal respiratory effort ?HEART: regular rate & rhythm ?ABDOMEN:  normoactive bowel sounds , non tender, not distended. ?Extremity: no pedal edema ?PSYCH: alert & oriented x 3 with fluent speech ?NEURO: no focal motor/sensory deficits ? ?LABORATORY DATA:  ?I have reviewed the data as listed ? ?. ? ?  Latest Ref Rng & Units 12/04/2021  ?  9:21 AM 05/27/2021  ? 12:01 PM 12/05/2020  ?  8:23 AM  ?CBC  ?WBC 4.0 - 10.5 K/uL 8.1   7.6   7.4    ?Hemoglobin 13.0 - 17.0 g/dL 15.7   15.5   16.1    ?Hematocrit  39.0 - 52.0 % 42.9   43.2   45.4    ?Platelets 150 - 400 K/uL 170   176   174    ? ? ?. ? ?  Latest Ref Rng & Units 12/04/2021  ?  9:21 AM 09/11/2021  ? 12:00 AM 05/27/2021  ? 12:01 PM  ?CMP  ?Glucose 70 - 99 mg/dL 129    133    ?BUN 8 - 23 mg/dL 14    12    ?Creatinine 0.61 - 1.24 mg/dL 0.92    0.94    ?Sodium 135 - 145 mmol/L 139    141    ?Potassium 3.5 - 5.1 mmol/L 4.7    5.0    ?Chloride 98 - 111 mmol/L 106    108    ?CO2 22 - 32 mmol/L 28    24    ?Calcium 8.9 - 10.3 mg/dL 9.7    9.4    ?Total Protein 6.5 - 8.1 g/dL 7.0    6.7    ?Total Bilirubin 0.3 - 1.2 mg/dL 2.2    1.7    ?Alkaline Phos 38 - 126 U/L 76    72    ?AST 15 - 41 U/L 29    27    ?ALT 0 - 44 U/L 32   28   28    ? ?. ?Lab Results  ?Component Value Date  ? IRON 267 (H) 12/04/2021  ? TIBC 308 12/04/2021  ? IRONPCTSAT 87 (H) 12/04/2021  ? ?(Iron and TIBC) ? ?Lab Results  ?Component Value Date  ? FERRITIN 248 12/04/2021  ? ? ? ? ?RADIOGRAPHIC STUDIES: ?I have personally reviewed the radiological images as listed and agreed with the findings in the report. ? ?ULTRASOUND ABDOMEN: 10/19/2019 ?  ?Echogenic liver question fatty  infiltration versus cirrhosis. ?  ?Mild splenomegaly. ?  ?Suspected medical renal disease changes of both kidneys ?  ?ULTRASOUND HEPATIC ELASTOGRAPHY: ?  ?Median kPa:  3.3 ?  ?Diagnostic category:  < or = 5 kPa: high pr

## 2022-02-17 ENCOUNTER — Telehealth: Payer: Self-pay | Admitting: Hematology

## 2022-02-17 NOTE — Telephone Encounter (Signed)
Rescheduled upcoming appointment due to provider's schedule. Patient is aware of changes. 

## 2022-03-18 DIAGNOSIS — Z85828 Personal history of other malignant neoplasm of skin: Secondary | ICD-10-CM | POA: Diagnosis not present

## 2022-03-18 DIAGNOSIS — L821 Other seborrheic keratosis: Secondary | ICD-10-CM | POA: Diagnosis not present

## 2022-03-18 DIAGNOSIS — L814 Other melanin hyperpigmentation: Secondary | ICD-10-CM | POA: Diagnosis not present

## 2022-03-18 DIAGNOSIS — L570 Actinic keratosis: Secondary | ICD-10-CM | POA: Diagnosis not present

## 2022-05-07 DIAGNOSIS — R7301 Impaired fasting glucose: Secondary | ICD-10-CM | POA: Diagnosis not present

## 2022-05-07 DIAGNOSIS — Z79899 Other long term (current) drug therapy: Secondary | ICD-10-CM | POA: Diagnosis not present

## 2022-05-07 DIAGNOSIS — Z125 Encounter for screening for malignant neoplasm of prostate: Secondary | ICD-10-CM | POA: Diagnosis not present

## 2022-05-07 DIAGNOSIS — I10 Essential (primary) hypertension: Secondary | ICD-10-CM | POA: Diagnosis not present

## 2022-05-14 DIAGNOSIS — I77810 Thoracic aortic ectasia: Secondary | ICD-10-CM | POA: Diagnosis not present

## 2022-05-14 DIAGNOSIS — E119 Type 2 diabetes mellitus without complications: Secondary | ICD-10-CM | POA: Diagnosis not present

## 2022-05-14 DIAGNOSIS — I10 Essential (primary) hypertension: Secondary | ICD-10-CM | POA: Diagnosis not present

## 2022-05-14 DIAGNOSIS — Z0000 Encounter for general adult medical examination without abnormal findings: Secondary | ICD-10-CM | POA: Diagnosis not present

## 2022-05-25 DIAGNOSIS — Z23 Encounter for immunization: Secondary | ICD-10-CM | POA: Diagnosis not present

## 2022-06-03 ENCOUNTER — Other Ambulatory Visit: Payer: Self-pay

## 2022-06-04 ENCOUNTER — Inpatient Hospital Stay: Payer: PPO

## 2022-06-04 ENCOUNTER — Inpatient Hospital Stay: Payer: PPO | Attending: Hematology

## 2022-06-04 LAB — CMP (CANCER CENTER ONLY)
ALT: 29 U/L (ref 0–44)
AST: 29 U/L (ref 15–41)
Albumin: 4.4 g/dL (ref 3.5–5.0)
Alkaline Phosphatase: 63 U/L (ref 38–126)
Anion gap: 3 — ABNORMAL LOW (ref 5–15)
BUN: 14 mg/dL (ref 8–23)
CO2: 28 mmol/L (ref 22–32)
Calcium: 9.1 mg/dL (ref 8.9–10.3)
Chloride: 107 mmol/L (ref 98–111)
Creatinine: 0.94 mg/dL (ref 0.61–1.24)
GFR, Estimated: >60 mL/min (ref >60)
Glucose, Bld: 118 mg/dL — ABNORMAL HIGH (ref 70–99)
Potassium: 4.6 mmol/L (ref 3.5–5.1)
Sodium: 138 mmol/L (ref 135–145)
Total Bilirubin: 2 mg/dL — ABNORMAL HIGH (ref 0.3–1.2)
Total Protein: 6.7 g/dL (ref 6.5–8.1)

## 2022-06-04 LAB — CBC WITH DIFFERENTIAL (CANCER CENTER ONLY)
Abs Immature Granulocytes: 0.01 10*3/uL (ref 0.00–0.07)
Basophils Absolute: 0 10*3/uL (ref 0.0–0.1)
Basophils Relative: 1 %
Eosinophils Absolute: 0.2 10*3/uL (ref 0.0–0.5)
Eosinophils Relative: 4 %
HCT: 43.7 % (ref 39.0–52.0)
Hemoglobin: 15.6 g/dL (ref 13.0–17.0)
Immature Granulocytes: 0 %
Lymphocytes Relative: 24 %
Lymphs Abs: 1.4 10*3/uL (ref 0.7–4.0)
MCH: 32.9 pg (ref 26.0–34.0)
MCHC: 35.7 g/dL (ref 30.0–36.0)
MCV: 92.2 fL (ref 80.0–100.0)
Monocytes Absolute: 0.7 10*3/uL (ref 0.1–1.0)
Monocytes Relative: 13 %
Neutro Abs: 3.3 10*3/uL (ref 1.7–7.7)
Neutrophils Relative %: 58 %
Platelet Count: 174 10*3/uL (ref 150–400)
RBC: 4.74 MIL/uL (ref 4.22–5.81)
RDW: 13.4 % (ref 11.5–15.5)
WBC Count: 5.7 10*3/uL (ref 4.0–10.5)
nRBC: 0 % (ref 0.0–0.2)

## 2022-06-04 LAB — IRON AND IRON BINDING CAPACITY (CC-WL,HP ONLY)
Iron: 185 ug/dL — ABNORMAL HIGH (ref 45–182)
Saturation Ratios: 67 % — ABNORMAL HIGH (ref 17.9–39.5)
TIBC: 277 ug/dL (ref 250–450)
UIBC: 92 ug/dL — ABNORMAL LOW (ref 117–376)

## 2022-06-04 NOTE — Patient Instructions (Signed)

## 2022-06-04 NOTE — Progress Notes (Signed)
Patient here for his ordered phlebotomy. Ferritin levels were 248 last draw and today's draw is not currently resulted. Hemoglobin is 15.6 today. Discussed with Dr. Irene Limbo- ok to proceed with today's phlebotomy with pending results. Patient tolerated his phlebotomy today -well. 504 was removed from 1021 to 1032 with a 16g needle (removed post procedure). No symptoms- SOB or otherwise. Chose not to stay for his entire 30 minute observation. Food and fluids offered.VSS- BP 109/81 (BP Location: Left Arm, Patient Position: Sitting)   Pulse 70   Temp 98.1 F (36.7 C) (Oral)   Resp 20   SpO2 100%

## 2022-06-25 DIAGNOSIS — I10 Essential (primary) hypertension: Secondary | ICD-10-CM | POA: Diagnosis not present

## 2022-06-25 DIAGNOSIS — E119 Type 2 diabetes mellitus without complications: Secondary | ICD-10-CM | POA: Diagnosis not present

## 2022-07-09 ENCOUNTER — Telehealth: Payer: Self-pay | Admitting: *Deleted

## 2022-07-09 NOTE — Patient Outreach (Signed)
  Care Coordination   Initial Visit Note   07/09/2022 Name: Alexander Pollard MRN: [id number] DOB: 01/16/1951  Alexander Pollard is a 71 y.o. year old male who sees Koirala, Dibas, MD for primary care. I spoke with  Alexander Pollard by phone today.  What matters to the patients health and wellness today?  No needs    Goals Addressed               This Visit's Progress     COMPLETED: No needs (pt-stated)        Care Coordination Interventions: Advised patient to schedule his AWV with his provider office Reviewed medications with patient and discussed adherence with no needed refills Reviewed scheduled/upcoming provider appointments including pending appointments Screening for signs and symptoms of depression related to chronic disease state  Assessed social determinant of health barriers          SDOH assessments and interventions completed:  Yes  SDOH Interventions Today    Flowsheet Row Most Recent Value  SDOH Interventions   Food Insecurity Interventions Intervention Not Indicated  Housing Interventions Intervention Not Indicated  Transportation Interventions Intervention Not Indicated  Utilities Interventions Intervention Not Indicated        Care Coordination Interventions Activated:  Yes  Care Coordination Interventions:  Yes, provided   Follow up plan: No further intervention required.   Encounter Outcome:  Pt. Visit Completed   Alexander Mina, RN Care Management Coordinator [place] Office [phone]

## 2022-07-09 NOTE — Patient Instructions (Signed)
Visit Information  Thank you for taking time to visit with me today. Please don't hesitate to contact me if I can be of assistance to you.   Following are the goals we discussed today:   Goals Addressed               This Visit's Progress     COMPLETED: No needs (pt-stated)        Care Coordination Interventions: Advised patient to schedule his AWV with his provider office Reviewed medications with patient and discussed adherence with no needed refills Reviewed scheduled/upcoming provider appointments including pending appointments Screening for signs and symptoms of depression related to chronic disease state  Assessed social determinant of health barriers          Please call the care guide team at [phone] if you need to cancel or reschedule your appointment.   If you are experiencing a Mental Health or [place] or need someone to talk to, please call the Suicide and Crisis Lifeline: 988  Patient verbalizes understanding of instructions and care plan provided today and agrees to view in [place]. Active MyChart status and patient understanding of how to access instructions and care plan via MyChart confirmed with patient.     No further follow up required: No needs  Raina Mina, RN Care Management Coordinator [place] Office [phone]

## 2022-07-10 ENCOUNTER — Other Ambulatory Visit: Payer: Self-pay | Admitting: Cardiology

## 2022-07-16 DIAGNOSIS — E119 Type 2 diabetes mellitus without complications: Secondary | ICD-10-CM | POA: Diagnosis not present

## 2022-07-27 DIAGNOSIS — J219 Acute bronchiolitis, unspecified: Secondary | ICD-10-CM | POA: Diagnosis not present

## 2022-08-02 NOTE — Progress Notes (Unsigned)
Cardiology Office Note:    Date:  08/04/2022   ID:  BLADYN TIPPS, DOB 10-15-1950, MRN [id number]  PCP:  Lujean Amel, MD   [facility] Providers Cardiologist:  Fransico Him, MD     Referring MD: Lujean Amel, MD   Chief Complaint: annual follow-up HTN, CAD  History of Present Illness:    Alexander Pollard is a very pleasant 71 y.o. male with a hx of HTN, hemochromatosis, mildly dilated aortic root, HFpEF, HLD coronary calcium score 83 in 2021  He underwent ETT 06/2015 due to atypical chest pain, had hypertensive response to exercise up to 195/107 prompting addition of Toprol XL.  Nuclear stress test 07/2015 was normal with EF 60%.  Ambulatory BP monitor 07/2015 was normal.  2D echo 04/10/2018 with normal LV function, grade 1 DD, and mildly dilated ascending aorta.  Last cardiology clinic visit was 07/31/2021 with Dr. Radford Pax at which time he was advised to start Crestor 5 mg and fish oil 4 grams daily given coronary calcifications and low HDL. One year follow-up was recommended.  Today, he is here for follow-up.  Reports he is feeling well with the exception of a persistent cough for the last several weeks following a respiratory infection.  Prior to respiratory symptoms, he was walking, biking 2-3 times per week. Has not been exercising over the past couple of weeks. Works as a Writer and walks up inclinces daily, no problems. He denies chest pain, shortness of breath, lower extremity edema, fatigue, palpitations, melena, hematuria, hemoptysis, diaphoresis, weakness, presyncope, syncope, orthopnea, and PND. Is frustrated by elevated blood glucose readings despite normal A1C. Has cut out processed foods and sugar.   Past Medical History:  Diagnosis Date   Ascending aortic aneurysm    32m on chest CT 08/2020 and echo 09/18/2020   Dyslipidemia    a. h/o low HDL.   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 Marked hypertensive response to exercise by ETT 06/2016 prompting addition of Toprol. b. F/u amb BP monitor normal.    Past Surgical History:  Procedure Laterality Date   COLONOSCOPY     next due 08/2017   IR THORACENTESIS ASP PLEURAL SPACE W/IMG GUIDE  01/16/2018   IR THORACENTESIS ASP PLEURAL SPACE W/IMG GUIDE  01/18/2018   LAPAROTOMY N/A 01/09/2018   Procedure: EXPLORATORY LAPAROTOMY, RIGHT COLECTOMY, PLACEMENT OF WOUND VAC TO ABDOMEN, DRAINAGE OF RETROPERITONEAL ABSCESS;  Surgeon: WRolm Bookbinder MD;  [facility]  Service: General;  Laterality: N/A;   LAPAROTOMY N/A 01/17/2018   Procedure: EXPLORATORY LAPAROTOMY;  Surgeon: TDonnie Mesa MD;  [facility]  Service: General;  Laterality: N/A;    Current Medications: Current Meds  Medication Sig   aspirin EC 81 MG tablet Take 81 mg by mouth daily.   metoprolol succinate (TOPROL-XL) 25 MG 24 hr tablet TAKE 1 TABLET BY MOUTH EVERY DAY   rosuvastatin (CRESTOR) 5 MG tablet Take 1 tablet (5 mg total) by mouth daily. Please call our office to schedule an yearly appointment with Dr. TRadford Paxfor November 2023 before anymore refills. [phone] Thank you 1st attempt   [DISCONTINUED] PFIZER COVID-19 VAC BIVALENT injection      Allergies:   Venlafaxine   Social History   Socioeconomic History   Marital status: Divorced    Spouse name: Not on file   Number of children:  Not on file   Years of education: Not on file   Highest education level: Not on file  Occupational History   Not on file  Tobacco Use   Smoking status: Former   Smokeless tobacco: Never  Vaping Use   Vaping Use: Never used  Substance and Sexual Activity   Alcohol use: Yes    Alcohol/week: 0.0 standard drinks of alcohol    Comment: Beer weekly.   Drug use: No   Sexual activity: Not on file  Other Topics Concern   Not on file  Social History Narrative   Not on file   Social Determinants of Health    Financial Resource Strain: Not on file  Food Insecurity: No Food Insecurity (07/09/2022)   Hunger Vital Sign    Worried About Running Out of Food in the Last Year: Never true    Ran Out of Food in the Last Year: Never true  Transportation Needs: No Transportation Needs (07/09/2022)   PRAPARE - Hydrologist (Medical): No    Lack of Transportation (Non-Medical): No  Physical Activity: Not on file  Stress: Not on file  Social Connections: Not on file     Family History: The patient's family history includes Heart attack in his mother; Heart disease in his brother and mother; Leukemia in his brother; Lymphoma in his brother.  ROS:   Please see the history of present illness.   All other systems reviewed and are negative.  Labs/Other Studies Reviewed:    The following studies were reviewed today:  Echo 09/18/21  1. Left ventricular ejection fraction, by estimation, is 55 to 60%. The  left ventricle has normal function. Left ventricular diastolic parameters  are consistent with Grade I diastolic dysfunction (impaired relaxation).   2. Right ventricular systolic function is normal.   3. Trivial mitral valve regurgitation.   4. The aortic valve is tricuspid. There is mild calcification of the  aortic valve. There is mild thickening of the aortic valve. Aortic valve  regurgitation is trivial. Aortic valve sclerosis is present, with no  evidence of aortic valve stenosis.   5. Aortic dilatation noted. There is mild dilatation of the aortic root,  measuring 40 mm. There is mild dilatation of the ascending aorta,  measuring 40 mm.   Comparison(s): Compared to prior TTE in 08/2020, the size of the aortic  root and ascending aorta are stable at 54m.  CT Cardiac Scoring 08/2020  1. Coronary calcium score of 83. This was 470percentile for age and sex matched control.   2. Ascending Aorta: Upper normal size with maximum diameter 40 mm (in double oblique  view).   Recent Labs: 06/04/2022: ALT 29; BUN 14; Creatinine 0.94; Hemoglobin 15.6; Platelet Count 174; Potassium 4.6; Sodium 138  Recent Lipid Panel    Component Value Date/Time   CHOL 77 (L) 09/11/2021 0000   TRIG 105 09/11/2021 0000   HDL 30 (L) 09/11/2021 0000   CHOLHDL 2.6 09/11/2021 0000   LDLCALC 27 09/11/2021 0000     Risk Assessment/Calculations:       Physical Exam:    VS:  BP 112/78   Pulse 77   Ht '5\' 8"'$  (1.727 m)   Wt 179 lb (81.2 kg)   SpO2 98%   BMI 27.22 kg/m     Wt Readings from Last 3 Encounters:  08/04/22 179 lb (81.2 kg)  12/04/21 188 lb 3.2 oz (85.4 kg)  07/31/21 185 lb (83.9 kg)  GEN:  Well nourished, well developed in no acute distress HEENT: Normal NECK: No JVD; No carotid bruits CARDIAC: RRR, no murmurs, rubs, gallops RESPIRATORY:  Clear to auscultation without rales, wheezing or rhonchi  ABDOMEN: Soft, non-tender, non-distended MUSCULOSKELETAL:  No edema; No deformity. 2+ pedal pulses, equal bilaterally SKIN: Warm and dry NEUROLOGIC:  Alert and oriented x 3 PSYCHIATRIC:  Normal affect   EKG:  EKG is ordered today.  The ekg ordered today demonstrates normal sinus rhythm at 77 bpm, low voltage QRS, nonspecific T wave abnormality, no acute change from previous tracing   Diagnoses:    1. Dyslipidemia   2. Essential hypertension   3. Coronary artery disease involving native coronary artery of native heart without angina pectoris   4. Subacute cough    Assessment and Plan:     Hypertension: BP is well-controlled.  CAD without angina: Coronary calcium score of 83 in 2021. He denies chest pain, dyspnea, or other symptoms concerning for angina.  No indication for further ischemic evaluation at this time. Cholesterol is well-controlled. Encouraged 150 minutes of moderate intensity exercise each week as well as heart healthy, mostly plant-based diet. Continue metoprolol, rosuvastatin, aspirin.  Hyperlipidemia LDL goal < 70: LDL 33 on  05/07/22.  Well-controlled.  Continue rosuvastatin.  Cough: Persistent cough, improving since recent respiratory infection.  Lungs are clear bilaterally. No indication for further testing.      Disposition: 1 year with Dr. Radford Pax  Medication Adjustments/Labs and Tests Ordered: Current medicines are reviewed at length with the patient today.  Concerns regarding medicines are outlined above.  Orders Placed This Encounter  Procedures   EKG 12-Lead   No orders of the defined types were placed in this encounter.   Patient Instructions  Medication Instructions:   Your physician recommends that you continue on your current medications as directed. Please refer to the Current Medication list given to you today.   *If you need a refill on your cardiac medications before your next appointment, please call your pharmacy*   Lab Work:  None ordered.  If you have labs (blood work) drawn today and your tests are completely normal, you will receive your results only by: [place] (if you have MyChart) OR A paper copy in the mail If you have any lab test that is abnormal or we need to change your treatment, we will call you to review the results.   Testing/Procedures:  None ordered.   Follow-Up: At [facility], you and your health needs are our priority.  As part of our continuing mission to provide you with exceptional heart care, we have created designated Provider Care Teams.  These Care Teams include your primary Cardiologist (physician) and Advanced Practice Providers (APPs -  Physician Assistants and Nurse Practitioners) who all work together to provide you with the care you need, when you need it.  We recommend signing up for the patient portal called "MyChart".  Sign up information is provided on this After Visit Summary.  MyChart is used to connect with patients for Virtual Visits (Telemedicine).  Patients are able to view lab/test results, encounter notes, upcoming  appointments, etc.  Non-urgent messages can be sent to your provider as well.   To learn more about what you can do with MyChart, go to [email or web address].    Your next appointment:   1 year(s)  The format for your next appointment:   In Person  Provider:   Fransico Him, MD     Other Instructions  Your physician wants you to follow-up in: 1 year with Dr. Radford Pax.  You will receive a reminder letter in the mail two months in advance. If you don't receive a letter, please call our office to schedule the follow-up appointment.   Important Information About Sugar         Signed, Emmaline Life, NP  08/04/2022 8:35 AM    [place]

## 2022-08-04 ENCOUNTER — Encounter: Payer: Self-pay | Admitting: Nurse Practitioner

## 2022-08-04 ENCOUNTER — Ambulatory Visit: Payer: PPO | Attending: Nurse Practitioner | Admitting: Nurse Practitioner

## 2022-08-04 VITALS — BP 112/78 | HR 77 | Ht 68.0 in | Wt 179.0 lb

## 2022-08-04 DIAGNOSIS — R052 Subacute cough: Secondary | ICD-10-CM

## 2022-08-04 DIAGNOSIS — I2510 Atherosclerotic heart disease of native coronary artery without angina pectoris: Secondary | ICD-10-CM | POA: Diagnosis not present

## 2022-08-04 DIAGNOSIS — I10 Essential (primary) hypertension: Secondary | ICD-10-CM

## 2022-08-04 DIAGNOSIS — E785 Hyperlipidemia, unspecified: Secondary | ICD-10-CM | POA: Diagnosis not present

## 2022-08-04 NOTE — Patient Instructions (Signed)
Medication Instructions:   Your physician recommends that you continue on your current medications as directed. Please refer to the Current Medication list given to you today.   *If you need a refill on your cardiac medications before your next appointment, please call your pharmacy*   Lab Work:  None ordered.  If you have labs (blood work) drawn today and your tests are completely normal, you will receive your results only by: [place] (if you have MyChart) OR A paper copy in the mail If you have any lab test that is abnormal or we need to change your treatment, we will call you to review the results.   Testing/Procedures:  None ordered.   Follow-Up: At [facility], you and your health needs are our priority.  As part of our continuing mission to provide you with exceptional heart care, we have created designated Provider Care Teams.  These Care Teams include your primary Cardiologist (physician) and Advanced Practice Providers (APPs -  Physician Assistants and Nurse Practitioners) who all work together to provide you with the care you need, when you need it.  We recommend signing up for the patient portal called "MyChart".  Sign up information is provided on this After Visit Summary.  MyChart is used to connect with patients for Virtual Visits (Telemedicine).  Patients are able to view lab/test results, encounter notes, upcoming appointments, etc.  Non-urgent messages can be sent to your provider as well.   To learn more about what you can do with MyChart, go to [email or web address].    Your next appointment:   1 year(s)  The format for your next appointment:   In Person  Provider:   Fransico Him, MD     Other Instructions  Your physician wants you to follow-up in: 1 year with Dr. Radford Pax.  You will receive a reminder letter in the mail two months in advance. If you don't receive a letter, please call our office to schedule the follow-up  appointment.   Important Information About Sugar

## 2022-08-21 ENCOUNTER — Other Ambulatory Visit: Payer: Self-pay | Admitting: Cardiology

## 2022-08-21 DIAGNOSIS — I10 Essential (primary) hypertension: Secondary | ICD-10-CM

## 2022-09-08 DIAGNOSIS — R5383 Other fatigue: Secondary | ICD-10-CM | POA: Diagnosis not present

## 2022-09-08 DIAGNOSIS — R197 Diarrhea, unspecified: Secondary | ICD-10-CM | POA: Diagnosis not present

## 2022-09-08 DIAGNOSIS — J40 Bronchitis, not specified as acute or chronic: Secondary | ICD-10-CM | POA: Diagnosis not present

## 2022-09-08 DIAGNOSIS — J329 Chronic sinusitis, unspecified: Secondary | ICD-10-CM | POA: Diagnosis not present

## 2022-09-08 DIAGNOSIS — R059 Cough, unspecified: Secondary | ICD-10-CM | POA: Diagnosis not present

## 2022-09-24 DIAGNOSIS — Z85828 Personal history of other malignant neoplasm of skin: Secondary | ICD-10-CM | POA: Diagnosis not present

## 2022-09-24 DIAGNOSIS — L570 Actinic keratosis: Secondary | ICD-10-CM | POA: Diagnosis not present

## 2022-09-24 DIAGNOSIS — L814 Other melanin hyperpigmentation: Secondary | ICD-10-CM | POA: Diagnosis not present

## 2022-09-24 DIAGNOSIS — L821 Other seborrheic keratosis: Secondary | ICD-10-CM | POA: Diagnosis not present

## 2022-09-24 DIAGNOSIS — D2271 Melanocytic nevi of right lower limb, including hip: Secondary | ICD-10-CM | POA: Diagnosis not present

## 2022-09-24 DIAGNOSIS — D692 Other nonthrombocytopenic purpura: Secondary | ICD-10-CM | POA: Diagnosis not present

## 2022-09-24 DIAGNOSIS — D225 Melanocytic nevi of trunk: Secondary | ICD-10-CM | POA: Diagnosis not present

## 2022-10-03 ENCOUNTER — Other Ambulatory Visit: Payer: Self-pay | Admitting: Cardiology

## 2022-10-07 ENCOUNTER — Other Ambulatory Visit: Payer: Self-pay

## 2022-10-08 ENCOUNTER — Inpatient Hospital Stay: Payer: PPO | Attending: Nurse Practitioner

## 2022-10-08 ENCOUNTER — Inpatient Hospital Stay: Payer: PPO

## 2022-10-08 LAB — CBC WITH DIFFERENTIAL (CANCER CENTER ONLY)
Abs Immature Granulocytes: 0.01 10*3/uL (ref 0.00–0.07)
Basophils Absolute: 0 10*3/uL (ref 0.0–0.1)
Basophils Relative: 1 %
Eosinophils Absolute: 0.2 10*3/uL (ref 0.0–0.5)
Eosinophils Relative: 4 %
HCT: 43.4 % (ref 39.0–52.0)
Hemoglobin: 15.8 g/dL (ref 13.0–17.0)
Immature Granulocytes: 0 %
Lymphocytes Relative: 23 %
Lymphs Abs: 1.5 10*3/uL (ref 0.7–4.0)
MCH: 33.1 pg (ref 26.0–34.0)
MCHC: 36.4 g/dL — ABNORMAL HIGH (ref 30.0–36.0)
MCV: 90.8 fL (ref 80.0–100.0)
Monocytes Absolute: 0.6 10*3/uL (ref 0.1–1.0)
Monocytes Relative: 9 %
Neutro Abs: 4.1 10*3/uL (ref 1.7–7.7)
Neutrophils Relative %: 63 %
Platelet Count: 139 10*3/uL — ABNORMAL LOW (ref 150–400)
RBC: 4.78 MIL/uL (ref 4.22–5.81)
RDW: 13.3 % (ref 11.5–15.5)
WBC Count: 6.4 10*3/uL (ref 4.0–10.5)
nRBC: 0 % (ref 0.0–0.2)

## 2022-10-08 LAB — CMP (CANCER CENTER ONLY)
ALT: 27 U/L (ref 0–44)
AST: 25 U/L (ref 15–41)
Albumin: 4.1 g/dL (ref 3.5–5.0)
Alkaline Phosphatase: 65 U/L (ref 38–126)
Anion gap: 3 — ABNORMAL LOW (ref 5–15)
BUN: 15 mg/dL (ref 8–23)
CO2: 30 mmol/L (ref 22–32)
Calcium: 9.5 mg/dL (ref 8.9–10.3)
Chloride: 105 mmol/L (ref 98–111)
Creatinine: 0.88 mg/dL (ref 0.61–1.24)
GFR, Estimated: >60 mL/min (ref >60)
Glucose, Bld: 115 mg/dL — ABNORMAL HIGH (ref 70–99)
Potassium: 4.5 mmol/L (ref 3.5–5.1)
Sodium: 138 mmol/L (ref 135–145)
Total Bilirubin: 2 mg/dL — ABNORMAL HIGH (ref 0.3–1.2)
Total Protein: 7 g/dL (ref 6.5–8.1)

## 2022-10-08 LAB — IRON AND IRON BINDING CAPACITY (CC-WL,HP ONLY)
Iron: 285 ug/dL — ABNORMAL HIGH (ref 45–182)
Saturation Ratios: 97 % — ABNORMAL HIGH (ref 17.9–39.5)
TIBC: 295 ug/dL (ref 250–450)
UIBC: 10 ug/dL — ABNORMAL LOW (ref 117–376)

## 2022-10-08 LAB — FERRITIN: Ferritin: 155 ng/mL (ref 24–336)

## 2022-10-08 NOTE — Progress Notes (Signed)
Per Dr. Irene Limbo- patient is getting his phlebotomy today even though it has not been six months since his last treatment. Hemoglobin is 15.8 and ferritin is not resulted. MD aware.  Therapeutic phlebotomy done per order- see above, as well- Patient tolerated it well. 16g set used in his L AC with no issues. Started at 1023 and ended at 1030 with 505 out. Needle removed. Patient agreed to stay for 15 minute observation. Food and fluid provided. VSS-  BP (!) 132/95 (BP Location: Left Arm, Patient Position: Sitting)   Pulse 84   Temp 99 F (37.2 C) (Oral)   Resp 16   SpO2 100%   Patient ambulatory to the lobby with no active bleed at site.

## 2022-10-08 NOTE — Patient Instructions (Signed)

## 2022-11-16 ENCOUNTER — Telehealth: Payer: Self-pay | Admitting: Hematology

## 2022-11-16 NOTE — Telephone Encounter (Signed)
Called patient to reschedule 3/22 appointment to later date due to schedule over crowding.

## 2022-12-03 ENCOUNTER — Ambulatory Visit: Payer: PPO | Admitting: Hematology

## 2022-12-28 ENCOUNTER — Inpatient Hospital Stay: Payer: PPO | Attending: Nurse Practitioner | Admitting: Hematology

## 2022-12-28 DIAGNOSIS — Z87891 Personal history of nicotine dependence: Secondary | ICD-10-CM | POA: Insufficient documentation

## 2022-12-28 DIAGNOSIS — Z79899 Other long term (current) drug therapy: Secondary | ICD-10-CM | POA: Insufficient documentation

## 2022-12-28 DIAGNOSIS — Z7982 Long term (current) use of aspirin: Secondary | ICD-10-CM | POA: Insufficient documentation

## 2022-12-28 NOTE — Progress Notes (Signed)
HEMATOLOGY/ONCOLOGY CLINIC NOTE  Date of Service: 12/28/22    Patient Care Team: Darrow Bussing, MD as PCP - General (Family Medicine) Quintella Reichert, MD as PCP - Cardiology (Cardiology) Levert Feinstein, MD as Consulting Physician (Oncology) Charlott Rakes, MD as Consulting Physician (Gastroenterology) Quintella Reichert, MD as Consulting Physician (Cardiology) Levert Feinstein, MD as Consulting Physician (Oncology)  CHIEF COMPLAINTS/PURPOSE OF CONSULTATION:  Follow-up for continued evaluation and management of hereditary hemochromatosis  HISTORY OF PRESENTING ILLNESS:   Alexander Pollard is a wonderful 72 y.o. male who has been referred to [place] by Dr. Cephas Darby for evaluation and management of Hereditary Hemochromatosis. The pt reports that he is doing well overall.   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 Initial workup was significatn for mild liver function abnormalities, serum iron at 264 with a 97% saturation and a Ferritin of 1498. A 01/16/14 CT A/P was obtained which revealed overall normal appearance of the liver with splenomegaly of 16cm, which was then seen to decrease to 13cm on 05/28/14 US Abdomen, and fibrosis noted. He began regular phlebotomies in September 2015, but then due to a "secretarial error," was lost to follow up between January 2017 and October 2019, and subsequently resumed regular phlebotomies in October 2019.  The pt reports that he had elevated liver functions for a few years prior to his initial diagnosis in April 2015. He denies family history of early heart or liver disease. The pt's brother also has hereditary hemochromatosis.  The pt notes that his last phlebotomy was in January of February. He denies any problems tolerating his therapeutic phlebotomies. He notes that he did not experience a worsened symptomatic difference in the 2.5 year interim in which he was lost to  follow up.  Most recent lab results: 10/31/18 HGB at 15.7 09/05/18 Ferritin at 156  On review of systems, pt reports good energy levels, staying active, eating well,  and denies joint pains or swellings, abdominal pains, leg swelling, and any other symptoms.   On PMHx the pt reports appendectomy in May 2019 denies liver or heart disease. On Family Hx the pt reports brother with hereditary hemochromatosis    INTERVAL HISTORY:  Alexander Pollard is a 72 y.o. male here for continued evaluation and management of hereditary hemochromatosis.    I last saw him on 12/05/22 and he was doing well at that time.  Today, he states that he has been doing very well. His last therapeutic phlebotomy was on 10/08/22. He is tolerating these well.  He denies any change in his energy levels after the phlebotomies. He denies any fatigue during the time leading up to his phlebotomies.  He states that he may drink 5-6 beers a month but does not drink alcohol most weeks. He rarely eats red meat.  He has started working part-time again and is enjoying it.  He denies any new medication changes.  He is concerned that his fasting glucose has been in the 110s. He notes that his A1c was normal and his PCP has been monitoring this.  MEDICAL HISTORY:  Past Medical History:  Diagnosis Date   Ascending aortic aneurysm (HCC)    40mm on chest CT 08/2020 and echo 09/18/2020   Dyslipidemia    a. h/o low HDL.   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 Marked hypertensive response to exercise by ETT 06/2016 prompting addition of Toprol. b. F/u amb BP monitor normal.    SURGICAL HISTORY: Past Surgical History:  Procedure Laterality Date   COLONOSCOPY     next due 08/2017   IR THORACENTESIS ASP PLEURAL SPACE W/IMG GUIDE  01/16/2018   IR THORACENTESIS ASP PLEURAL SPACE W/IMG GUIDE  01/18/2018    LAPAROTOMY N/A 01/09/2018   Procedure: EXPLORATORY LAPAROTOMY, RIGHT COLECTOMY, PLACEMENT OF WOUND VAC TO ABDOMEN, DRAINAGE OF RETROPERITONEAL ABSCESS;  Surgeon: Emelia Loron, MD;  [facility];  Service: General;  Laterality: N/A;   LAPAROTOMY N/A 01/17/2018   Procedure: EXPLORATORY LAPAROTOMY;  Surgeon: Manus Rudd, MD;  Location: [facility];  Service: General;  Laterality: N/A;    SOCIAL HISTORY: Social History   Socioeconomic History   Marital status: Divorced    Spouse name: Not on file   Number of children: Not on file   Years of education: Not on file   Highest education level: Not on file  Occupational History   Not on file  Tobacco Use   Smoking status: Former   Smokeless tobacco: Never  Vaping Use   Vaping Use: Never used  Substance and Sexual Activity   Alcohol use: Yes    Alcohol/week: 0.0 standard drinks of alcohol    Comment: Beer weekly.   Drug use: No   Sexual activity: Not on file  Other Topics Concern   Not on file  Social History Narrative   Not on file   Social Determinants of Health   Financial Resource Strain: Not on file  Food Insecurity: No Food Insecurity (07/09/2022)   Hunger Vital Sign    Worried About Running Out of Food in the Last Year: Never true    Ran Out of Food in the Last Year: Never true  Transportation Needs: No Transportation Needs (07/09/2022)   PRAPARE - Administrator, Civil Service (Medical): No    Lack of Transportation (Non-Medical): No  Physical Activity: Not on file  Stress: Not on file  Social Connections: Not on file  Intimate Partner Violence: Not on file    FAMILY HISTORY: Family History  Problem Relation Age of Onset   Heart attack Mother    Heart disease Mother    Leukemia Brother    Lymphoma Brother    Heart disease Brother     ALLERGIES:  is allergic to venlafaxine.  MEDICATIONS:  Current Outpatient Medications  Medication Sig Dispense Refill   aspirin EC 81 MG tablet Take 81 mg by  mouth daily.     metoprolol succinate (TOPROL-XL) 25 MG 24 hr tablet TAKE 1 TABLET BY MOUTH EVERY DAY 90 tablet 3   rosuvastatin (CRESTOR) 5 MG tablet Take 1 tablet (5 mg total) by mouth daily. 90 tablet 3   No current facility-administered medications for this visit.    REVIEW OF SYSTEMS:   10 Point review of Systems was done is negative except as noted above.  PHYSICAL EXAMINATION:  Vitals:   12/28/22 1448  BP: 116/79  Pulse: 79  Resp: 16  Temp: 97.7 F (36.5 C)  SpO2: 98%    Filed Weights   12/28/22 1448  Weight: 183 lb 11.2 oz (83.3 kg)    Body mass index is 27.93 kg/m.   GENERAL:alert, in no acute distress and comfortable SKIN: no acute rashes, no significant lesions EYES: conjunctiva are pink and non-injected, sclera anicteric OROPHARYNX: MMM, no exudates, no oropharyngeal  erythema or ulceration NECK: supple, no JVD LYMPH:  no palpable lymphadenopathy in the cervical, axillary or inguinal regions LUNGS: clear to auscultation b/l with normal respiratory effort HEART: regular rate & rhythm ABDOMEN:  normoactive bowel sounds , non tender, not distended. Extremity: no pedal edema PSYCH: alert & oriented x 3 with fluent speech NEURO: no focal motor/sensory deficits  LABORATORY DATA:  I have reviewed the data as listed    Latest Ref Rng & Units 10/08/2022    9:37 AM 06/04/2022    9:23 AM 12/04/2021    9:21 AM  CBC  WBC 4.0 - 10.5 K/uL 6.4  5.7  8.1   Hemoglobin 13.0 - 17.0 g/dL 69.6  29.5  28.4   Hematocrit 39.0 - 52.0 % 43.4  43.7  42.9   Platelets 150 - 400 K/uL 139  174  170       Latest Ref Rng & Units 10/08/2022    9:37 AM 06/04/2022    9:21 AM 12/04/2021    9:21 AM  CMP  Glucose 70 - 99 mg/dL 132  440  102   BUN 8 - 23 mg/dL Creatinine 0.61 - 1.24 mg/dL 7.25  3.66  4.40   Sodium 135 - 145 mmol/L 138  138  139   Potassium 3.5 - 5.1 mmol/L 4.5  4.6  4.7   Chloride 98 - 111 mmol/L 105  107  106   CO2 22 - 32 mmol/L Calcium  8.9 - 10.3 mg/dL 9.5  9.1  9.7   Total Protein 6.5 - 8.1 g/dL 7.0  6.7  7.0   Total Bilirubin 0.3 - 1.2 mg/dL 2.0  2.0  2.2   Alkaline Phos 38 - 126 U/L 65  63  76   AST 15 - 41 U/L ALT 0 - 44 U/L 27  29  32    Lab Results  Component Value Date   IRON 285 (H) 10/08/2022   TIBC 295 10/08/2022   FERRITIN 155 10/08/2022     RADIOGRAPHIC STUDIES: I have personally reviewed the radiological images as listed and agreed with the findings in the report. No results found.   ULTRASOUND ABDOMEN: 10/19/2019 Echogenic liver question fatty infiltration versus cirrhosis. Mild splenomegaly. Suspected medical renal disease changes of both kidneys ULTRASOUND HEPATIC ELASTOGRAPHY: Median kPa:  3.3 Diagnostic category:  < or = 5 kPa: high probability of being normal   ASSESSMENT & PLAN:   72 y.o. male with  1. Hereditary Hemochromatosis April 2015 homozygous C282Y mutation  April 2015 Ferritin at 1498 upon initial diagnosis 01/15/18 CT A/P revealed splenomegaly with spleen at 15.6cm Pt denies concern for heart of liver disease --01/29/2019 Hemochromatosis DNA revealed Homozygous C282Y  PLAN: -Discussed pt labwork from 10/08/22. CBC showed a HGB of 15.8 and platelets of 139K. His iron was 285 and ferritin was 155. CMP stable. His bilirubin was 2.0, LFTs otherwise WNL. -Labs with each therapeutic phlebotomy. -Again reiterated minimizing alcohol use and maintaining good hydration.  -Avoid any new medications that contain iron and high iron-containing foods. -Continue with therapeutic phlebotomies every 6 months.   FOLLOW UP: RTC with Dr. Candise Che in 1 year.  The total time spent in the appointment was 20 minutes*.  All of the patient's questions were answered with apparent satisfaction. The patient knows to call the clinic with any problems, questions or concerns.   Wyvonnia Lora MD  MS AAHIVMS [facility] Hematology/Oncology Physician [facility]  .*Total Encounter Time  as defined by the Centers for Medicare and Medicaid Services includes, in addition to the face-to-face time of a patient visit (documented in the note above) non-face-to-face time: obtaining and reviewing outside history, ordering and reviewing medications, tests or procedures, care coordination (communications with other health care professionals or caregivers) and documentation in the medical record.   I,Alexis Herring,acting as a Neurosurgeon for Wyvonnia Lora, MD.,have documented all relevant documentation on the behalf of Wyvonnia Lora, MD,as directed by  Wyvonnia Lora, MD while in the presence of Wyvonnia Lora, MD.  .I have reviewed the above documentation for accuracy and completeness, and I agree with the above. Johney Maine MD

## 2023-01-04 ENCOUNTER — Encounter: Payer: Self-pay | Admitting: Hematology

## 2023-01-14 DIAGNOSIS — R7301 Impaired fasting glucose: Secondary | ICD-10-CM | POA: Diagnosis not present

## 2023-02-04 ENCOUNTER — Other Ambulatory Visit: Payer: PPO

## 2023-02-21 ENCOUNTER — Ambulatory Visit (INDEPENDENT_AMBULATORY_CARE_PROVIDER_SITE_OTHER): Payer: PPO | Admitting: Podiatry

## 2023-02-21 ENCOUNTER — Encounter: Payer: Self-pay | Admitting: Podiatry

## 2023-02-21 DIAGNOSIS — M2011 Hallux valgus (acquired), right foot: Secondary | ICD-10-CM

## 2023-02-21 DIAGNOSIS — M2012 Hallux valgus (acquired), left foot: Secondary | ICD-10-CM

## 2023-02-21 NOTE — Progress Notes (Signed)
This patient presents to the office for a foot exam.  He says he has been an avid runner for years and he has developed foot  deformities. He says he is having  intermittant pain through the left forefoot.   He presents to the office for a foot evaluation.   He presents to the office after 16 months.  Vascular  Dorsalis pedis and posterior tibial pulses are weakly palpable  B/L.  Capillary return  WNL.  Temperature gradient is  WNL.  Skin turgor  WNL  Sensorium  Senn Weinstein monofilament wire  WNL. Normal tactile sensation.  Nail Exam  Patient has normal nails with thickened nails noted both feet.  Pincer nail 3rd toenail left foot..  Orthopedic  Exam  Muscle tone and muscle strength  WNL.  No limitations of motion feet  B/L.  No crepitus or joint effusion noted.  Foot type is unremarkable and digits show no abnormalities.  HAV  B/L.  Hammer toes  B/l.    Tailors bunions  B/L.  Excessive pronation upon weight bearing.  Skin  No open lesions.  Normal skin texture and turgor.  Capsulitis 3rd metatarsal left foot.  HAV  B/L  Hammer toes  B/L.  Tailors Bunion  B/L  ROV.  Discussed this condition with this patient. Told him his fat pad is not present under his third metatarsal left foot.  This accounts for his left forefoot pain.  RTC 1 year.  Foot exam performed.   Helane Gunther DPM

## 2023-03-09 DIAGNOSIS — I4891 Unspecified atrial fibrillation: Secondary | ICD-10-CM | POA: Diagnosis not present

## 2023-03-09 DIAGNOSIS — I10 Essential (primary) hypertension: Secondary | ICD-10-CM | POA: Diagnosis not present

## 2023-03-09 DIAGNOSIS — Z87891 Personal history of nicotine dependence: Secondary | ICD-10-CM | POA: Diagnosis not present

## 2023-03-09 DIAGNOSIS — I2510 Atherosclerotic heart disease of native coronary artery without angina pectoris: Secondary | ICD-10-CM | POA: Diagnosis not present

## 2023-03-09 DIAGNOSIS — D6869 Other thrombophilia: Secondary | ICD-10-CM | POA: Diagnosis not present

## 2023-03-09 DIAGNOSIS — Z7982 Long term (current) use of aspirin: Secondary | ICD-10-CM | POA: Diagnosis not present

## 2023-03-09 DIAGNOSIS — E663 Overweight: Secondary | ICD-10-CM | POA: Diagnosis not present

## 2023-03-09 DIAGNOSIS — I77810 Thoracic aortic ectasia: Secondary | ICD-10-CM | POA: Diagnosis not present

## 2023-03-09 DIAGNOSIS — E785 Hyperlipidemia, unspecified: Secondary | ICD-10-CM | POA: Diagnosis not present

## 2023-03-09 DIAGNOSIS — F3342 Major depressive disorder, recurrent, in full remission: Secondary | ICD-10-CM | POA: Diagnosis not present

## 2023-03-09 DIAGNOSIS — D692 Other nonthrombocytopenic purpura: Secondary | ICD-10-CM | POA: Diagnosis not present

## 2023-03-25 DIAGNOSIS — D225 Melanocytic nevi of trunk: Secondary | ICD-10-CM | POA: Diagnosis not present

## 2023-03-25 DIAGNOSIS — Z85828 Personal history of other malignant neoplasm of skin: Secondary | ICD-10-CM | POA: Diagnosis not present

## 2023-03-25 DIAGNOSIS — L821 Other seborrheic keratosis: Secondary | ICD-10-CM | POA: Diagnosis not present

## 2023-03-25 DIAGNOSIS — C44722 Squamous cell carcinoma of skin of right lower limb, including hip: Secondary | ICD-10-CM | POA: Diagnosis not present

## 2023-03-25 DIAGNOSIS — L570 Actinic keratosis: Secondary | ICD-10-CM | POA: Diagnosis not present

## 2023-03-25 DIAGNOSIS — L814 Other melanin hyperpigmentation: Secondary | ICD-10-CM | POA: Diagnosis not present

## 2023-04-07 ENCOUNTER — Other Ambulatory Visit: Payer: Self-pay | Admitting: *Deleted

## 2023-04-08 ENCOUNTER — Other Ambulatory Visit: Payer: Self-pay

## 2023-04-08 ENCOUNTER — Inpatient Hospital Stay: Payer: PPO

## 2023-04-08 ENCOUNTER — Inpatient Hospital Stay: Payer: PPO | Attending: Nurse Practitioner

## 2023-04-08 LAB — CBC WITH DIFFERENTIAL (CANCER CENTER ONLY)
Abs Immature Granulocytes: 0.01 10*3/uL (ref 0.00–0.07)
Basophils Absolute: 0 10*3/uL (ref 0.0–0.1)
Basophils Relative: 1 %
Eosinophils Absolute: 0.3 10*3/uL (ref 0.0–0.5)
Eosinophils Relative: 4 %
HCT: 42 % (ref 39.0–52.0)
Hemoglobin: 15.4 g/dL (ref 13.0–17.0)
Immature Granulocytes: 0 %
Lymphocytes Relative: 24 %
Lymphs Abs: 1.5 10*3/uL (ref 0.7–4.0)
MCH: 33.1 pg (ref 26.0–34.0)
MCHC: 36.7 g/dL — ABNORMAL HIGH (ref 30.0–36.0)
MCV: 90.3 fL (ref 80.0–100.0)
Monocytes Absolute: 0.6 10*3/uL (ref 0.1–1.0)
Monocytes Relative: 9 %
Neutro Abs: 3.9 10*3/uL (ref 1.7–7.7)
Neutrophils Relative %: 62 %
Platelet Count: 166 10*3/uL (ref 150–400)
RBC: 4.65 MIL/uL (ref 4.22–5.81)
RDW: 13.1 % (ref 11.5–15.5)
WBC Count: 6.3 10*3/uL (ref 4.0–10.5)
nRBC: 0 % (ref 0.0–0.2)

## 2023-04-08 LAB — CMP (CANCER CENTER ONLY)
ALT: 21 U/L (ref 0–44)
AST: 22 U/L (ref 15–41)
Albumin: 4.3 g/dL (ref 3.5–5.0)
Alkaline Phosphatase: 66 U/L (ref 38–126)
Anion gap: 5 (ref 5–15)
BUN: 18 mg/dL (ref 8–23)
CO2: 26 mmol/L (ref 22–32)
Calcium: 9.4 mg/dL (ref 8.9–10.3)
Chloride: 108 mmol/L (ref 98–111)
Creatinine: 0.99 mg/dL (ref 0.61–1.24)
GFR, Estimated: >60 mL/min (ref >60)
Glucose, Bld: 116 mg/dL — ABNORMAL HIGH (ref 70–99)
Potassium: 4.6 mmol/L (ref 3.5–5.1)
Sodium: 139 mmol/L (ref 135–145)
Total Bilirubin: 2.2 mg/dL — ABNORMAL HIGH (ref 0.3–1.2)
Total Protein: 6.5 g/dL (ref 6.5–8.1)

## 2023-04-08 LAB — FERRITIN: Ferritin: 110 ng/mL (ref 24–336)

## 2023-04-08 LAB — IRON AND IRON BINDING CAPACITY (CC-WL,HP ONLY)
Iron: 250 ug/dL — ABNORMAL HIGH (ref 45–182)
Saturation Ratios: 86 % — ABNORMAL HIGH (ref 17.9–39.5)
TIBC: 291 ug/dL (ref 250–450)
UIBC: 41 ug/dL — ABNORMAL LOW (ref 117–376)

## 2023-04-08 NOTE — Patient Instructions (Signed)

## 2023-04-08 NOTE — Progress Notes (Signed)
Alexander Pollard presents today for phlebotomy per MD orders. Phlebotomy procedure started at 0955 and ended at 1000. 500 grams removed. Patient declined 30 minute post observation. 16G Phebotomy kit used to L-AC. Patient tolerated procedure well. IV needle removed intact. Patient discharged, ambulatory to lobby, in stable condition.

## 2023-04-11 ENCOUNTER — Telehealth: Payer: Self-pay

## 2023-04-11 NOTE — Telephone Encounter (Signed)
Alexander Pollard: please notify patient that his ferritin levels are improving and we recommend phlebotomies every 6months as recommended by Dr. Candise Che   Pt advised and in agreement with this plan

## 2023-04-18 DIAGNOSIS — Z85828 Personal history of other malignant neoplasm of skin: Secondary | ICD-10-CM | POA: Diagnosis not present

## 2023-04-18 DIAGNOSIS — C44722 Squamous cell carcinoma of skin of right lower limb, including hip: Secondary | ICD-10-CM | POA: Diagnosis not present

## 2023-05-20 DIAGNOSIS — I10 Essential (primary) hypertension: Secondary | ICD-10-CM | POA: Diagnosis not present

## 2023-05-20 DIAGNOSIS — E119 Type 2 diabetes mellitus without complications: Secondary | ICD-10-CM | POA: Diagnosis not present

## 2023-05-20 DIAGNOSIS — I700 Atherosclerosis of aorta: Secondary | ICD-10-CM | POA: Diagnosis not present

## 2023-05-20 DIAGNOSIS — Z0001 Encounter for general adult medical examination with abnormal findings: Secondary | ICD-10-CM | POA: Diagnosis not present

## 2023-05-20 DIAGNOSIS — Z23 Encounter for immunization: Secondary | ICD-10-CM | POA: Diagnosis not present

## 2023-05-20 DIAGNOSIS — Z79899 Other long term (current) drug therapy: Secondary | ICD-10-CM | POA: Diagnosis not present

## 2023-05-20 DIAGNOSIS — I77810 Thoracic aortic ectasia: Secondary | ICD-10-CM | POA: Diagnosis not present

## 2023-08-04 ENCOUNTER — Ambulatory Visit: Payer: PPO | Admitting: Nurse Practitioner

## 2023-08-06 NOTE — Progress Notes (Unsigned)
Cardiology Office Note:  .   Date:  08/08/2023  ID:  Alexander Pollard, DOB [birth year], MRN [id number] PCP: Darrow Bussing, MD  [facility] Providers Cardiologist:  Armanda Magic, MD    Patient Profile: .      PMH Hypertension Hemochromatosis Mild dilatation of aortic root HFpEF Hyperlipidemia Elevated coronary artery calcium score  CT calcium score 08/29/2020 CAC score 83 (43rd percentile) Ascending aorta 40 mm Family history CAD  He underwent ETT 06/2015 due to atypical chest pain and had hypertensive response to exercise up to 195/107 prompting the addition of Toprol XL.  Nuclear stress test 07/2015 was normal, EF 60%.  Ambulatory BP monitor 07/2015 was normal.  2D echo 04/10/2018 with normal LV function, mild grade 1 diastolic dysfunction, and mildly dilated ascending aorta.  He had CT calcium score 08/29/2020 which revealed coronary artery calcium score of 83 (43rd percentile).  He was advised to start Crestor 5 mg and fish oil 4 g daily given coronary calcifications and low HDL. Echo 09/18/2021 revealed normal LVEF 55-60%, G1DD, mild AV sclerosis with no stenosis, mild dilatation of aortic root 40 mm and ascending aorta 40 mm  Last cardiology clinic visit was 08/04/2022 with me.  He reported a persistent cough for the previous several weeks following a respiratory infection.  Prior to respiratory symptoms, he was walking and biking 2-3 times per week.  He continued to work as a Engineer, technical sales.  He reported frustration with elevated blood glucose readings despite normal A1c.  He had cut out processed food and sugar. BP was well controlled. LDL was 33 on 05/07/22. One year follow-up was recommended.        History of Present Illness: .   Alexander Pollard is a very pleasant 72 y.o. male who is here today for follow-up of hypertension and CAD.  He expresses concern about his glucose levels, which have been fluctuating, despite maintaining a good A1c level for the past several years. A1C has never  been > 5% dating back to 2014. Has been advised by PCP that he may be prediabetic and was referred to a nutritionist for dietary advice. He made some dietary changes, including cutting out soft drinks, sweet tea, and cookies, but acknowledges that further improvements could be made. He asks about discerning between age-related symptoms and cardiac symptoms.  He remains active with walking and cycling and works as a Engineer, technical sales. Notices some decrease in flexibility. 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 Weight is up 9lbs by our scale. BP has remained stable.  He denies chest pain, shortness of breath, orthopnea, PND, edema, presyncope, syncope, palpitations. No bleeding concerns.   Discussed the use of AI scribe software for clinical note transcription with the patient, who gave verbal consent to proceed.   ROS: See HPI       Studies Reviewed: Marland Kitchen   EKG Interpretation Date/Time:  Monday August 08 2023 14:44:05 EST Ventricular Rate:  73 PR Interval:  210 QRS Duration:  70 QT Interval:  390 QTC Calculation: 429 R Axis:   -21  Text Interpretation: Sinus rhythm with 1st degree A-V block Nonspecific ST abnormality No acute changes Confirmed by Eligha Bridegroom [phone]) on 08/08/2023 2:50:21 PM    Risk Assessment/Calculations:             Physical Exam:   VS:  BP 110/72   Pulse 73   Ht 5\' 7"  (1.702 m)   Wt 188  lb 12.8 oz (85.6 kg)   SpO2 95%   BMI 29.57 kg/m    Wt Readings from Last 3 Encounters:  08/08/23 188 lb 12.8 oz (85.6 kg)  12/28/22 183 lb 11.2 oz (83.3 kg)  08/04/22 179 lb (81.2 kg)    GEN: Well nourished, well developed in no acute distress NECK: No JVD; No carotid bruits CARDIAC: RRR, no murmurs, rubs, gallops RESPIRATORY:  Clear to auscultation without rales, wheezing or rhonchi  ABDOMEN: Soft, non-tender, non-distended EXTREMITIES:  No edema; No deformity     ASSESSMENT AND PLAN: .    CAD without angina: Family  history of heart disease.  CT calcium score 08/2020 revealed CAC score of 83, 43rd percentile.  He remains active and exercises regularly. He denies chest pain, dyspnea, or other symptoms concerning for angina.  No indication for further ischemic evaluation at this time. Reports a noticeable decrease in flexibility recently. No bleeding concerns.  Continue aspirin, metoprolol, rosuvastatin. Focus on secondary prevention including heart healthy mostly plant based diet avoiding saturated fat, processed foods, simple carbohydrates, and sugar along with aiming for at least 150 minutes of moderate intensity exercise each week.   Hyperlipidemia LDL goal < 70: Lipid panel 05/20/2023 revealed total cholesterol 76, HDL 25, LDL 32, and triglycerides 90. We discussed healthy diet options in the place of his current diet. Continue regular exercise.  Continue rosuvastatin.  Hypertension: BP is well controlled.  Renal function stable on most recent lab work completed 05/20/2023. No medication changes today.   Diastolic dysfunction: Echo 09/18/2021 revealed normal LVEF 55 to 60%, G1 DD, normal RV, no significant valve disease. He appears euvolemic on exam, no concern for volume overload. No dyspnea, orthopnea, PND, or edema.   Hemochromatosis: Iron saturation 86% on labs completed 04/08/23. Followed by hematology.  He reports he is seen every 6 months and routinely gives blood, but cannot recall last blood donation. Encouraged him to keep follow-up appointment. Consideration given to repeating echocardiogram but he remains asymptomatic. Consider repeat echo at next office visit.         Dispo: 1 year with Dr. Mayford Knife  Signed, Eligha Bridegroom, NP-C

## 2023-08-08 ENCOUNTER — Ambulatory Visit: Payer: PPO | Attending: Nurse Practitioner | Admitting: Nurse Practitioner

## 2023-08-08 ENCOUNTER — Encounter: Payer: Self-pay | Admitting: Hematology

## 2023-08-08 ENCOUNTER — Encounter: Payer: Self-pay | Admitting: Nurse Practitioner

## 2023-08-08 VITALS — BP 110/72 | HR 73 | Ht 67.0 in | Wt 188.8 lb

## 2023-08-08 DIAGNOSIS — I5189 Other ill-defined heart diseases: Secondary | ICD-10-CM

## 2023-08-08 DIAGNOSIS — I2510 Atherosclerotic heart disease of native coronary artery without angina pectoris: Secondary | ICD-10-CM | POA: Diagnosis not present

## 2023-08-08 DIAGNOSIS — E785 Hyperlipidemia, unspecified: Secondary | ICD-10-CM | POA: Diagnosis not present

## 2023-08-08 DIAGNOSIS — I10 Essential (primary) hypertension: Secondary | ICD-10-CM

## 2023-08-08 NOTE — Patient Instructions (Signed)
Medication Instructions:   Your physician recommends that you continue on your current medications as directed. Please refer to the Current Medication list given to you today.   *If you need a refill on your cardiac medications before your next appointment, please call your pharmacy*   Lab Work:  None ordered.  If you have labs (blood work) drawn today and your tests are completely normal, you will receive your results only by: MyChart Message (if you have MyChart) OR A paper copy in the mail If you have any lab test that is abnormal or we need to change your treatment, we will call you to review the results.   Testing/Procedures:  None ordered.   Follow-Up: At [facility], you and your health needs are our priority.  As part of our continuing mission to provide you with exceptional heart care, we have created designated Provider Care Teams.  These Care Teams include your primary Cardiologist (physician) and Advanced Practice Providers (APPs -  Physician Assistants and Nurse Practitioners) who all work together to provide you with the care you need, when you need it.  We recommend signing up for the patient portal called "MyChart".  Sign up information is provided on this After Visit Summary.  MyChart is used to connect with patients for Virtual Visits (Telemedicine).  Patients are able to view lab/test results, encounter notes, upcoming appointments, etc.  Non-urgent messages can be sent to your provider as well.   To learn more about what you can do with MyChart, go to [email or web address].    Your next appointment:   1 year(s)  Provider:   Armanda Magic, MD     Other Instructions  Your physician wants you to follow-up in: 1 year.  You will receive a reminder letter in the mail two months in advance. If you don't receive a letter, please call our office to schedule the follow-up appointment.

## 2023-08-18 ENCOUNTER — Other Ambulatory Visit: Payer: Self-pay | Admitting: Cardiology

## 2023-08-18 DIAGNOSIS — I10 Essential (primary) hypertension: Secondary | ICD-10-CM

## 2023-09-12 ENCOUNTER — Encounter: Payer: Self-pay | Admitting: Hematology

## 2023-10-13 ENCOUNTER — Other Ambulatory Visit: Payer: Self-pay

## 2023-10-14 ENCOUNTER — Inpatient Hospital Stay: Payer: PPO | Attending: Internal Medicine

## 2023-10-14 ENCOUNTER — Inpatient Hospital Stay: Payer: PPO

## 2023-10-14 DIAGNOSIS — Z79899 Other long term (current) drug therapy: Secondary | ICD-10-CM | POA: Diagnosis present

## 2023-10-14 LAB — IRON AND IRON BINDING CAPACITY (CC-WL,HP ONLY)
Iron: 271 ug/dL — ABNORMAL HIGH (ref 45–182)
Saturation Ratios: 86 % — ABNORMAL HIGH (ref 17.9–39.5)
TIBC: 315 ug/dL (ref 250–450)
UIBC: 44 ug/dL — ABNORMAL LOW (ref 117–376)

## 2023-10-14 LAB — CMP (CANCER CENTER ONLY)
ALT: 19 U/L (ref 0–44)
AST: 20 U/L (ref 15–41)
Albumin: 4.2 g/dL (ref 3.5–5.0)
Alkaline Phosphatase: 68 U/L (ref 38–126)
Anion gap: 5 (ref 5–15)
BUN: 14 mg/dL (ref 8–23)
CO2: 23 mmol/L (ref 22–32)
Calcium: 8.9 mg/dL (ref 8.9–10.3)
Chloride: 108 mmol/L (ref 98–111)
Creatinine: 0.97 mg/dL (ref 0.61–1.24)
GFR, Estimated: >60 mL/min (ref >60)
Glucose, Bld: 207 mg/dL — ABNORMAL HIGH (ref 70–99)
Potassium: 4.1 mmol/L (ref 3.5–5.1)
Sodium: 136 mmol/L (ref 135–145)
Total Bilirubin: 2 mg/dL — ABNORMAL HIGH (ref 0.0–1.2)
Total Protein: 6.5 g/dL (ref 6.5–8.1)

## 2023-10-14 LAB — CBC WITH DIFFERENTIAL (CANCER CENTER ONLY)
Abs Immature Granulocytes: 0.02 10*3/uL (ref 0.00–0.07)
Basophils Absolute: 0 10*3/uL (ref 0.0–0.1)
Basophils Relative: 1 %
Eosinophils Absolute: 0.2 10*3/uL (ref 0.0–0.5)
Eosinophils Relative: 3 %
HCT: 42.2 % (ref 39.0–52.0)
Hemoglobin: 15.4 g/dL (ref 13.0–17.0)
Immature Granulocytes: 0 %
Lymphocytes Relative: 19 %
Lymphs Abs: 1.1 10*3/uL (ref 0.7–4.0)
MCH: 32.4 pg (ref 26.0–34.0)
MCHC: 36.5 g/dL — ABNORMAL HIGH (ref 30.0–36.0)
MCV: 88.7 fL (ref 80.0–100.0)
Monocytes Absolute: 0.4 10*3/uL (ref 0.1–1.0)
Monocytes Relative: 7 %
Neutro Abs: 3.8 10*3/uL (ref 1.7–7.7)
Neutrophils Relative %: 70 %
Platelet Count: 159 10*3/uL (ref 150–400)
RBC: 4.76 MIL/uL (ref 4.22–5.81)
RDW: 13.1 % (ref 11.5–15.5)
WBC Count: 5.5 10*3/uL (ref 4.0–10.5)
nRBC: 0 % (ref 0.0–0.2)

## 2023-10-14 LAB — FERRITIN: Ferritin: 81 ng/mL (ref 24–336)

## 2023-10-14 NOTE — Progress Notes (Signed)
Alexander Pollard presents today for phlebotomy per MD orders. Phlebotomy procedure started at 0956 and ended at 1001. 520 grams removed. 16G phlebotomy kit used to L-AC. Patient observed for 15 minutes after procedure without any incident. Patient tolerated procedure well. Declined snack or beverage.  IV needle removed intact.

## 2023-10-14 NOTE — Progress Notes (Signed)
Per Dr Candise Che pt ok for Phlebotomy with HGB> 15 and Ferritin and Iron labs pending.

## 2023-10-14 NOTE — Patient Instructions (Signed)

## 2023-11-18 DIAGNOSIS — E119 Type 2 diabetes mellitus without complications: Secondary | ICD-10-CM | POA: Diagnosis not present

## 2024-01-05 ENCOUNTER — Other Ambulatory Visit: Payer: Self-pay

## 2024-01-06 ENCOUNTER — Inpatient Hospital Stay: Payer: PPO | Admitting: Hematology

## 2024-01-06 ENCOUNTER — Inpatient Hospital Stay: Payer: PPO | Attending: Hematology

## 2024-01-06 ENCOUNTER — Inpatient Hospital Stay: Payer: PPO

## 2024-01-06 DIAGNOSIS — Z79899 Other long term (current) drug therapy: Secondary | ICD-10-CM | POA: Diagnosis not present

## 2024-01-06 LAB — CBC WITH DIFFERENTIAL (CANCER CENTER ONLY)
Abs Immature Granulocytes: 0.01 10*3/uL (ref 0.00–0.07)
Basophils Absolute: 0 10*3/uL (ref 0.0–0.1)
Basophils Relative: 1 %
Eosinophils Absolute: 0.2 10*3/uL (ref 0.0–0.5)
Eosinophils Relative: 4 %
HCT: 43.9 % (ref 39.0–52.0)
Hemoglobin: 15.8 g/dL (ref 13.0–17.0)
Immature Granulocytes: 0 %
Lymphocytes Relative: 21 %
Lymphs Abs: 1.1 10*3/uL (ref 0.7–4.0)
MCH: 32.1 pg (ref 26.0–34.0)
MCHC: 36 g/dL (ref 30.0–36.0)
MCV: 89.2 fL (ref 80.0–100.0)
Monocytes Absolute: 0.4 10*3/uL (ref 0.1–1.0)
Monocytes Relative: 7 %
Neutro Abs: 3.5 10*3/uL (ref 1.7–7.7)
Neutrophils Relative %: 67 %
Platelet Count: 164 10*3/uL (ref 150–400)
RBC: 4.92 MIL/uL (ref 4.22–5.81)
RDW: 13.2 % (ref 11.5–15.5)
WBC Count: 5.3 10*3/uL (ref 4.0–10.5)
nRBC: 0 % (ref 0.0–0.2)

## 2024-01-06 LAB — CMP (CANCER CENTER ONLY)
ALT: 23 U/L (ref 0–44)
AST: 23 U/L (ref 15–41)
Albumin: 4.6 g/dL (ref 3.5–5.0)
Alkaline Phosphatase: 62 U/L (ref 38–126)
Anion gap: 3 — ABNORMAL LOW (ref 5–15)
BUN: 12 mg/dL (ref 8–23)
CO2: 29 mmol/L (ref 22–32)
Calcium: 9.1 mg/dL (ref 8.9–10.3)
Chloride: 108 mmol/L (ref 98–111)
Creatinine: 0.95 mg/dL (ref 0.61–1.24)
GFR, Estimated: >60 mL/min (ref >60)
Glucose, Bld: 115 mg/dL — ABNORMAL HIGH (ref 70–99)
Potassium: 4.6 mmol/L (ref 3.5–5.1)
Sodium: 140 mmol/L (ref 135–145)
Total Bilirubin: 1.7 mg/dL — ABNORMAL HIGH (ref 0.0–1.2)
Total Protein: 7 g/dL (ref 6.5–8.1)

## 2024-01-06 LAB — IRON AND IRON BINDING CAPACITY (CC-WL,HP ONLY)
Iron: 230 ug/dL — ABNORMAL HIGH (ref 45–182)
Saturation Ratios: 68 % — ABNORMAL HIGH (ref 17.9–39.5)
TIBC: 337 ug/dL (ref 250–450)
UIBC: 107 ug/dL — ABNORMAL LOW (ref 117–376)

## 2024-01-06 LAB — FERRITIN: Ferritin: 105 ng/mL (ref 24–336)

## 2024-01-06 NOTE — Progress Notes (Signed)
 Alexander Pollard presents today for phlebotomy per MD orders.  Hct 15.8, Previous Ferritin 81, Previous saturation 86. Okay to proceed with phlebotomy per Salomon Cree, MD. Phlebotomy procedure started at 1123 and ended at 1132. 500 grams removed via LAC 18G Patient observed for 10 minutes after procedure without any incident. Patient tolerated procedure well. IV needle removed intact.

## 2024-01-06 NOTE — Progress Notes (Signed)
 Patient seen by Dr. Minnie Amber are within treatment parameters.  Labs reviewed: and are within treatment parameters. Ferritin and Iron pending per Dr Salomon Cree pt is ok for phlebotomy today  Per physician team, patient is ready for treatment and there are NO modifications to the treatment plan.

## 2024-01-06 NOTE — Patient Instructions (Signed)

## 2024-01-06 NOTE — Progress Notes (Signed)
 HEMATOLOGY/ONCOLOGY CLINIC NOTE  Date of Service: 01/06/24    Patient Care Team: Lanae Pinal, MD as PCP - General (Family Medicine) Jacqueline Matsu, MD as PCP - Cardiology (Cardiology) Scotty Cyphers, MD as Consulting Physician (Oncology) Baldo Bonds, MD as Consulting Physician (Gastroenterology) Jacqueline Matsu, MD as Consulting Physician (Cardiology) Scotty Cyphers, MD as Consulting Physician (Oncology)  CHIEF COMPLAINTS/PURPOSE OF CONSULTATION:  Follow-up for continued evaluation and management of hereditary hemochromatosis  HISTORY OF PRESENTING ILLNESS:   Alexander Pollard is a wonderful 73 y.o. male who has been referred to us  by Dr. Harvel Linear for evaluation and management of Hereditary Hemochromatosis. The pt reports that he is doing well overall.   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 Initial workup was significatn for mild liver function abnormalities, serum iron at 264 with a 97% saturation and a Ferritin of 1498. A 01/16/14 CT A/P was obtained which revealed overall normal appearance of the liver with splenomegaly of 16cm, which was then seen to decrease to 13cm on 05/28/14 US  Abdomen, and fibrosis noted. He began regular phlebotomies in September 2015, but then due to a "secretarial error," was lost to follow up between January 2017 and October 2019, and subsequently resumed regular phlebotomies in October 2019.  The pt reports that he had elevated liver functions for a few years prior to his initial diagnosis in April 2015. He denies family history of early heart or liver disease. The pt's brother also has hereditary hemochromatosis.  The pt notes that his last phlebotomy was in January of February. He denies any problems tolerating his therapeutic phlebotomies. He notes that he did not experience a worsened symptomatic difference in the 2.5 year interim in which he was lost to  follow up.  Most recent lab results: 10/31/18 HGB at 15.7 09/05/18 Ferritin at 156  On review of systems, pt reports good energy levels, staying active, eating well,  and denies joint pains or swellings, abdominal pains, leg swelling, and any other symptoms.   On PMHx the pt reports appendectomy in May 2019 denies liver or heart disease. On Family Hx the pt reports brother with hereditary hemochromatosis  INTERVAL HISTORY:  Alexander Pollard is a 73 y.o. male here for continued evaluation and management of hereditary hemochromatosis.    I last saw him on 12/28/2022 and he was doing well overall.   Today, he reports that he has been doing well overall over the last year with no new concerns. Patient has been tolerating his phlebotomies well every 6 months with no new or major toxicity issues.   He denies having any major changes in his diet over the last year. He has tried to cut down on his sugar intake to manage blood glucose levels. Patient denies any  major changes in his red meat consumption. Patient has minimal alcohol consumption, generally drinking a couple beers a month.   Blood glucose levels mildly elevated today. Patient notes that he did fast this morning.   Patient reports that his hemoglobin A1C levels have always been low over the last two years.   Patient denies any back pain, abdominal pain, leg sweling, new joint pain, or excessive blistering/sunburns.   He does plan to travel to the beach soon.   He continues to work part-time.   MEDICAL HISTORY:  Past Medical History:  Diagnosis Date   Ascending aortic aneurysm (HCC)    40mm on chest CT 08/2020  and echo 09/18/2020   Dyslipidemia    a. h/o low HDL.   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 Marked hypertensive response to exercise by ETT 06/2016 prompting addition of Toprol . b. F/u amb BP  monitor normal.    SURGICAL HISTORY: Past Surgical History:  Procedure Laterality Date   COLONOSCOPY     next due 08/2017   IR THORACENTESIS ASP PLEURAL SPACE W/IMG GUIDE  01/16/2018   IR THORACENTESIS ASP PLEURAL SPACE W/IMG GUIDE  01/18/2018   LAPAROTOMY N/A 01/09/2018   Procedure: EXPLORATORY LAPAROTOMY, RIGHT COLECTOMY, PLACEMENT OF WOUND VAC TO ABDOMEN, DRAINAGE OF RETROPERITONEAL ABSCESS;  Surgeon: Enid Harry, MD;  Location: [facility] OR;  Service: General;  Laterality: N/A;   LAPAROTOMY N/A 01/17/2018   Procedure: EXPLORATORY LAPAROTOMY;  Surgeon: Dareen Ebbing, MD;  Location: [facility] OR;  Service: General;  Laterality: N/A;    SOCIAL HISTORY: Social History   Socioeconomic History   Marital status: Divorced    Spouse name: Not on file   Number of children: Not on file   Years of education: Not on file   Highest education level: Not on file  Occupational History   Not on file  Tobacco Use   Smoking status: Former   Smokeless tobacco: Never  Vaping Use   Vaping status: Never Used  Substance and Sexual Activity   Alcohol use: Yes    Alcohol/week: 0.0 standard drinks of alcohol    Comment: Beer weekly.   Drug use: No   Sexual activity: Not on file  Other Topics Concern   Not on file  Social History Narrative   Not on file   Social Drivers of Health   Financial Resource Strain: Not on file  Food Insecurity: No Food Insecurity (07/09/2022)   Hunger Vital Sign    Worried About Running Out of Food in the Last Year: Never true    Ran Out of Food in the Last Year: Never true  Transportation Needs: No Transportation Needs (07/09/2022)   PRAPARE - Administrator, Civil Service (Medical): No    Lack of Transportation (Non-Medical): No  Physical Activity: Not on file  Stress: Not on file  Social Connections: Not on file  Intimate Partner Violence: Not on file    FAMILY HISTORY: Family History  Problem Relation Age of Onset   Heart attack Mother    Heart  disease Mother    Leukemia Brother    Lymphoma Brother    Heart disease Brother     ALLERGIES:  has no known allergies.  MEDICATIONS:  Current Outpatient Medications  Medication Sig Dispense Refill   aspirin EC 81 MG tablet Take 81 mg by mouth daily.     metoprolol  succinate (TOPROL -XL) 25 MG 24 hr tablet TAKE 1 TABLET BY MOUTH EVERY DAY 90 tablet 3   rosuvastatin  (CRESTOR ) 5 MG tablet TAKE 1 TABLET (5 MG TOTAL) BY MOUTH DAILY. 90 tablet 3   No current facility-administered medications for this visit.    REVIEW OF SYSTEMS:    10 Point review of Systems was done is negative except as noted above.   PHYSICAL EXAMINATION:  Vitals:   01/06/24 1023  BP: 133/83  Pulse: 66  Resp: 18  Temp: 97.9 F (36.6 C)  SpO2: 100%   Filed Weights   01/06/24 1023  Weight: 180 lb (81.6  kg)  Body mass index is 28.19 kg/m.    GENERAL:alert, in no acute distress and comfortable SKIN: no acute rashes, no significant lesions EYES: conjunctiva are pink and non-injected, sclera anicteric OROPHARYNX: MMM, no exudates, no oropharyngeal erythema or ulceration NECK: supple, no JVD LYMPH:  no palpable lymphadenopathy in the cervical, axillary or inguinal regions LUNGS: clear to auscultation b/l with normal respiratory effort HEART: regular rate & rhythm ABDOMEN:  normoactive bowel sounds , non tender, not distended. Extremity: no pedal edema PSYCH: alert & oriented x 3 with fluent speech NEURO: no focal motor/sensory deficits   LABORATORY DATA:  I have reviewed the data as listed     Latest Ref Rng & Units 01/06/2024    9:46 AM 10/14/2023    9:06 AM 04/08/2023    9:31 AM  CBC  WBC 4.0 - 10.5 K/uL 5.3  5.5  6.3   Hemoglobin 13.0 - 17.0 g/dL 28.4  13.2  44.0   Hematocrit 39.0 - 52.0 % 43.9  42.2  42.0   Platelets 150 - 400 K/uL 164  159  166       Latest Ref Rng & Units 01/06/2024    9:46 AM 10/14/2023    9:06 AM 04/08/2023    9:31 AM  CMP  Glucose 70 - 99 mg/dL 102  725  366   BUN 8  - 23 mg/dL 12  14  18    Creatinine 0.61 - 1.24 mg/dL 4.40  3.47  4.25   Sodium 135 - 145 mmol/L 140  136  139   Potassium 3.5 - 5.1 mmol/L 4.6  4.1  4.6   Chloride 98 - 111 mmol/L 108  108  108   CO2 22 - 32 mmol/L 29  23  26    Calcium  8.9 - 10.3 mg/dL 9.1  8.9  9.4   Total Protein 6.5 - 8.1 g/dL 7.0  6.5  6.5   Total Bilirubin 0.0 - 1.2 mg/dL 1.7  2.0  2.2   Alkaline Phos 38 - 126 U/L 62  68  66   AST 15 - 41 U/L 23  20  22    ALT 0 - 44 U/L 23  19  21     Lab Results  Component Value Date   IRON 230 (H) 01/06/2024   TIBC 337 01/06/2024   FERRITIN 105 01/06/2024     RADIOGRAPHIC STUDIES: I have personally reviewed the radiological images as listed and agreed with the findings in the report. No results found.   ULTRASOUND ABDOMEN: 10/19/2019 Echogenic liver question fatty infiltration versus cirrhosis. Mild splenomegaly. Suspected medical renal disease changes of both kidneys ULTRASOUND HEPATIC ELASTOGRAPHY: Median kPa:  3.3 Diagnostic category:  < or = 5 kPa: high probability of being normal   ASSESSMENT & PLAN:   73 y.o. male with  1. Hereditary Hemochromatosis April 2015 homozygous C282Y mutation  April 2015 Ferritin at 1498 upon initial diagnosis 01/15/18 CT A/P revealed splenomegaly with spleen at 15.6cm Pt denies concern for heart of liver disease --01/29/2019 Hemochromatosis DNA revealed Homozygous C282Y  PLAN:  -Discussed lab results on 01/06/24 in detail with patient. CBC stable, showed WBC of 5.3K, hemoglobin of 15.8, and platelets of 164K. -CMP shows blood glucose is mildly high. -CMP is otherwise stable -borderline elevated bilirubin at 1.7 mg/dL -educated patient that bilirubin is one of the markers of liver function measuring how well the liver handles waste products. Discussed that levels can fluctate from fasting. The rest of his liver function testing is  normal -discussed other possibility of certain enzymes modifying bilirubin -patient denies being told  any previous concern of this -reviewed abdominal ultrasound 10/19/2019 which showed  low likelhood of cirrhosis  -educated patient that if an individual has A1C testing within 1 month of having a phlebotomy, there can be new RBCs replacing part of the blood which have no time to glycosylate and hgb A1C levels may be underestimated on lab findings.  -iron levels improved  -last ferritin from 2 months ago was down to 81, which is great. Ferritin today is 105 with an iron saturation of 68% -educated patient that sometimes with higher hemochromatosis levels, there can be increased sensitivity to sunburns. However, his iron levels are normal. -proceed with phlebotomy today -discussed that at some point, there may be a role for less frequent phlebotomies down the line such as once a year -iron labs from today are pending at time of clinical visit - will review and adjust phlebotomy accordingly -answered all of patient's questions in detail  FOLLOW UP: Labs and therapeutic phlebotomies every 6 months x 4 MD visit in 1 yr  The total time spent in the appointment was 20 minutes* .  All of the patient's questions were answered with apparent satisfaction. The patient knows to call the clinic with any problems, questions or concerns.   Jacquelyn Matt MD MS AAHIVMS [facility] Hematology/Oncology Physician [facility]  .*Total Encounter Time as defined by the Centers for Medicare and Medicaid Services includes, in addition to the face-to-face time of a patient visit (documented in the note above) non-face-to-face time: obtaining and reviewing outside history, ordering and reviewing medications, tests or procedures, care coordination (communications with other health care professionals or caregivers) and documentation in the medical record.    I,Mitra Faeizi,acting as a Neurosurgeon for Jacquelyn Matt, MD.,have documented all relevant documentation on the behalf of Jacquelyn Matt, MD,as directed by  Jacquelyn Matt, MD while in the presence of Jacquelyn Matt, MD.  .I have reviewed the above documentation for accuracy and completeness, and I agree with the above. .Meriem Lemieux Kishore Masey Scheiber MD

## 2024-01-13 ENCOUNTER — Encounter: Payer: Self-pay | Admitting: Hematology

## 2024-02-20 ENCOUNTER — Telehealth: Payer: Self-pay | Admitting: Hematology

## 2024-02-20 NOTE — Telephone Encounter (Signed)
 Left a voicemail with the scheduled appointment details. Informed the patient to call back with any questions or if needing to reschedule.

## 2024-02-23 ENCOUNTER — Ambulatory Visit: Payer: PPO | Admitting: Podiatry

## 2024-02-23 ENCOUNTER — Encounter: Payer: Self-pay | Admitting: Podiatry

## 2024-02-23 DIAGNOSIS — M2011 Hallux valgus (acquired), right foot: Secondary | ICD-10-CM

## 2024-02-23 DIAGNOSIS — M2012 Hallux valgus (acquired), left foot: Secondary | ICD-10-CM

## 2024-02-23 NOTE — Progress Notes (Signed)
 This patient presents to the office for a foot exam.  He says he has been an avid runner for years and he has developed foot  deformities. He says he is having  intermittant pain through the left forefoot.   He presents to the office for a foot evaluation.   He presents to the office after 12  months.  Vascular  Dorsalis pedis and posterior tibial pulses are weakly palpable  B/L.  Capillary return  WNL.  Temperature gradient is  WNL.  Skin turgor  WNL  Sensorium  Senn Weinstein monofilament wire  WNL. Normal tactile sensation.  Nail Exam  Patient has normal nails with thickened nails noted both feet.  Pincer nail 3rd toenail left foot..  Orthopedic  Exam  Muscle tone and muscle strength  WNL.  No limitations of motion feet  B/L.  No crepitus or joint effusion noted.  Foot type is unremarkable and digits show no abnormalities.  HAV  B/L.  Hammer toes  B/l.    Tailors bunions  B/L.  Excessive pronation upon weight bearing.  Skin  No open lesions.  Normal skin texture and turgor.   HAV  B/L  Hammer toes  B/L.  Tailors Bunion  B/L  ROV.  Discussed this condition with this patient. Told him his fat pad is not present under his third metatarsal left foot.   RTC 1 year.  Foot exam performed.   Ruffin Cotton DPM

## 2024-02-24 ENCOUNTER — Encounter

## 2024-02-24 ENCOUNTER — Other Ambulatory Visit

## 2024-02-28 DIAGNOSIS — R350 Frequency of micturition: Secondary | ICD-10-CM | POA: Diagnosis not present

## 2024-02-28 DIAGNOSIS — Z125 Encounter for screening for malignant neoplasm of prostate: Secondary | ICD-10-CM | POA: Diagnosis not present

## 2024-02-29 DIAGNOSIS — E119 Type 2 diabetes mellitus without complications: Secondary | ICD-10-CM | POA: Diagnosis not present

## 2024-02-29 DIAGNOSIS — I10 Essential (primary) hypertension: Secondary | ICD-10-CM | POA: Diagnosis not present

## 2024-03-12 DIAGNOSIS — E119 Type 2 diabetes mellitus without complications: Secondary | ICD-10-CM | POA: Diagnosis not present

## 2024-03-12 DIAGNOSIS — I10 Essential (primary) hypertension: Secondary | ICD-10-CM | POA: Diagnosis not present

## 2024-03-29 DIAGNOSIS — I10 Essential (primary) hypertension: Secondary | ICD-10-CM | POA: Diagnosis not present

## 2024-03-29 DIAGNOSIS — E119 Type 2 diabetes mellitus without complications: Secondary | ICD-10-CM | POA: Diagnosis not present

## 2024-04-03 DIAGNOSIS — L570 Actinic keratosis: Secondary | ICD-10-CM | POA: Diagnosis not present

## 2024-04-03 DIAGNOSIS — Z85828 Personal history of other malignant neoplasm of skin: Secondary | ICD-10-CM | POA: Diagnosis not present

## 2024-04-03 DIAGNOSIS — L814 Other melanin hyperpigmentation: Secondary | ICD-10-CM | POA: Diagnosis not present

## 2024-04-03 DIAGNOSIS — L821 Other seborrheic keratosis: Secondary | ICD-10-CM | POA: Diagnosis not present

## 2024-04-12 DIAGNOSIS — I10 Essential (primary) hypertension: Secondary | ICD-10-CM | POA: Diagnosis not present

## 2024-04-12 DIAGNOSIS — E119 Type 2 diabetes mellitus without complications: Secondary | ICD-10-CM | POA: Diagnosis not present

## 2024-04-13 ENCOUNTER — Encounter (HOSPITAL_BASED_OUTPATIENT_CLINIC_OR_DEPARTMENT_OTHER): Payer: Self-pay | Admitting: *Deleted

## 2024-04-24 DIAGNOSIS — M25551 Pain in right hip: Secondary | ICD-10-CM | POA: Diagnosis not present

## 2024-04-28 DIAGNOSIS — I10 Essential (primary) hypertension: Secondary | ICD-10-CM | POA: Diagnosis not present

## 2024-04-28 DIAGNOSIS — E119 Type 2 diabetes mellitus without complications: Secondary | ICD-10-CM | POA: Diagnosis not present

## 2024-05-13 DIAGNOSIS — E119 Type 2 diabetes mellitus without complications: Secondary | ICD-10-CM | POA: Diagnosis not present

## 2024-05-13 DIAGNOSIS — I10 Essential (primary) hypertension: Secondary | ICD-10-CM | POA: Diagnosis not present

## 2024-05-28 DIAGNOSIS — E119 Type 2 diabetes mellitus without complications: Secondary | ICD-10-CM | POA: Diagnosis not present

## 2024-05-28 DIAGNOSIS — I10 Essential (primary) hypertension: Secondary | ICD-10-CM | POA: Diagnosis not present

## 2024-06-08 DIAGNOSIS — I10 Essential (primary) hypertension: Secondary | ICD-10-CM | POA: Diagnosis not present

## 2024-06-08 DIAGNOSIS — I700 Atherosclerosis of aorta: Secondary | ICD-10-CM | POA: Diagnosis not present

## 2024-06-08 DIAGNOSIS — Z1331 Encounter for screening for depression: Secondary | ICD-10-CM | POA: Diagnosis not present

## 2024-06-08 DIAGNOSIS — E119 Type 2 diabetes mellitus without complications: Secondary | ICD-10-CM | POA: Diagnosis not present

## 2024-06-08 DIAGNOSIS — Z0001 Encounter for general adult medical examination with abnormal findings: Secondary | ICD-10-CM | POA: Diagnosis not present

## 2024-06-08 DIAGNOSIS — Z23 Encounter for immunization: Secondary | ICD-10-CM | POA: Diagnosis not present

## 2024-06-08 DIAGNOSIS — Z79899 Other long term (current) drug therapy: Secondary | ICD-10-CM | POA: Diagnosis not present

## 2024-06-12 ENCOUNTER — Telehealth (HOSPITAL_BASED_OUTPATIENT_CLINIC_OR_DEPARTMENT_OTHER): Payer: Self-pay | Admitting: *Deleted

## 2024-06-12 DIAGNOSIS — E119 Type 2 diabetes mellitus without complications: Secondary | ICD-10-CM | POA: Diagnosis not present

## 2024-06-12 DIAGNOSIS — I10 Essential (primary) hypertension: Secondary | ICD-10-CM | POA: Diagnosis not present

## 2024-06-12 DIAGNOSIS — M25551 Pain in right hip: Secondary | ICD-10-CM | POA: Diagnosis not present

## 2024-06-12 NOTE — Telephone Encounter (Signed)
   Pre-operative Risk Assessment    Patient Name: Alexander Pollard  DOB: 03-21-1951 MRN: [id number]   Date of last office visit: 08/08/23 ROSALINE BANE, NP Date of next office visit: 07/18/24 DR. TURNER   Request for Surgical Clearance    Procedure:  RIGHT TOTAL HIP ARTHROPLASTY  Date of Surgery:  Clearance TBD                                Surgeon:  DR. DANIEL MARCHWIANY Surgeon's Group or Practice Name:  [facility] Phone number:  [phone] [address]T [address] Fax number:  [phone]   Type of Clearance Requested:   - Medical  - Pharmacy:  Hold Aspirin     Type of Anesthesia:  Spinal   Additional requests/questions:    [name]   06/12/2024, 5:26 PM

## 2024-06-13 NOTE — Telephone Encounter (Signed)
 Primary Cardiologist:Traci Shlomo, MD   Preoperative team, please contact this patient and set up a phone call appointment if patient wishes to have surgery prior to appointment with Dr. Shlomo for 07/18/24 for further preoperative risk assessment. Please obtain consent and complete medication review. Thank you for your help.   I confirm that guidance regarding antiplatelet and oral anticoagulation therapy has been completed and, if necessary, noted below.  Per office protocol and pending no concerning symptoms at time of visit, he may hold aspirin for 5-7 days prior to procedure and should resume as soon as hemodynamically stable postoperatively.  I also confirmed the patient resides in the state of  . As per [facility] Medical Board telemedicine laws, the patient must reside in the state in which the provider is licensed.   Rosaline EMERSON Bane, NP-C  06/13/2024, 7:44 AM [address] [place], [place] [address] Office [phone] Fax [phone]

## 2024-06-13 NOTE — Telephone Encounter (Signed)
 Pt opts to have tele preop appt as he is trying to get surgery scheduled ASAP. He was told by surgery scheduler early to mid Dec 2025, but she will not schedule until she receives the clearance. Pt would like to get the clearance done ASAP so he can be scheduled for his surgery. Pt will keep his appt 07/18/24 with Dr. Shlomo as planned but did not want to wait and see MD for preop clearance.

## 2024-06-27 DIAGNOSIS — I10 Essential (primary) hypertension: Secondary | ICD-10-CM | POA: Diagnosis not present

## 2024-06-27 DIAGNOSIS — E119 Type 2 diabetes mellitus without complications: Secondary | ICD-10-CM | POA: Diagnosis not present

## 2024-06-29 ENCOUNTER — Ambulatory Visit: Attending: Internal Medicine

## 2024-06-29 DIAGNOSIS — Z01810 Encounter for preprocedural cardiovascular examination: Secondary | ICD-10-CM | POA: Diagnosis not present

## 2024-06-29 NOTE — Progress Notes (Signed)
 Virtual Visit via Telephone Note   Because of Alexander Pollard co-morbid illnesses, he is at least at moderate risk for complications without adequate follow up.  This format is felt to be most appropriate for this patient at this time.  Due to technical limitations with video connection (technology), today's appointment will be conducted as an audio only telehealth visit, and Alexander Pollard verbally agreed to proceed in this manner.   All issues noted in this document were discussed and addressed.  No physical exam could be performed with this format.  Evaluation Performed:  Preoperative cardiovascular risk assessment _____________   Date:  06/29/2024   Patient ID:  Alexander Pollard, DOB [birth year], MRN [id number] Patient Location:  Home Provider location:   Office  Primary Care Provider:  Regino Slater, MD Primary Cardiologist:  Wilbert Bihari, MD Chief Complaint / Patient Profile  73 y.o. y/o male with a h/o hypertension, hemochromatosis, mild dilation of the aortic root, HFpEF, hyperlipidemia, elevated coronary artery calcium  score who is pending right total hip arthroplasty and presents today for telephonic preoperative cardiovascular risk assessment. History of Present Illness  Alexander Pollard is a 73 y.o. male who presents via audio/video conferencing for a telehealth visit today.  Pt was last seen in cardiology clinic on 08/08/2023 by Rosaline Bane, NP.  At that time Alexander Pollard was doing well.  The patient is now pending procedure as outlined above. Since his last visit, he has remained stable from a cardiac standpoint. He denies chest pain, shortness of breath, lower extremity edema, fatigue, palpitations, melena, hematuria, hemoptysis, diaphoresis, weakness, presyncope, syncope, orthopnea, and PND. He regularly walks, lifts weights at home and bikes, he is able to achieve greater than 4 METs of activity.  Past Medical History    Past Medical History:  Diagnosis Date   Ascending  aortic aneurysm    40mm on chest CT 08/2020 and echo 09/18/2020   Dyslipidemia    a. h/o low HDL.   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 Marked hypertensive response to exercise by ETT 06/2016 prompting addition of Toprol . b. F/u amb BP monitor normal.   Past Surgical History:  Procedure Laterality Date   COLONOSCOPY     next due 08/2017   IR THORACENTESIS ASP PLEURAL SPACE W/IMG GUIDE  01/16/2018   IR THORACENTESIS ASP PLEURAL SPACE W/IMG GUIDE  01/18/2018   LAPAROTOMY N/A 01/09/2018   Procedure: EXPLORATORY LAPAROTOMY, RIGHT COLECTOMY, PLACEMENT OF WOUND VAC TO ABDOMEN, DRAINAGE OF RETROPERITONEAL ABSCESS;  Surgeon: Ebbie Cough, MD;  Location: [facility] OR;  Service: General;  Laterality: N/A;   LAPAROTOMY N/A 01/17/2018   Procedure: EXPLORATORY LAPAROTOMY;  Surgeon: Belinda Cough, MD;  Location: [facility] OR;  Service: General;  Laterality: N/A;   Allergies No Known Allergies Home Medications    Prior to Admission medications   Medication Sig Start Date End Date Taking? Authorizing Provider  aspirin EC 81 MG tablet Take 81 mg by mouth daily.    [provider]  metoprolol  succinate (TOPROL -XL) 25 MG 24 hr tablet TAKE 1 TABLET BY MOUTH EVERY DAY 08/18/23   Swinyer, Rosaline HERO, NP  rosuvastatin  (CRESTOR ) 5 MG tablet TAKE 1 TABLET (5 MG TOTAL) BY MOUTH DAILY. 08/18/23   Swinyer, Rosaline HERO, NP   Physical Exam  Vital Signs:  Alexander Pollard does not have vital  signs available for review today. Given telephonic nature of communication, physical exam is limited. AAOx3. NAD. Normal affect.  Speech and respirations are unlabored. Accessory Clinical Findings  None Assessment & Plan  1.  Preoperative Cardiovascular Risk Assessment: Alexander Pollard's perioperative risk of a major cardiac event is 0.9% according to the Revised Cardiac Risk Index (RCRI).  Therefore, he is at  low risk for perioperative complications.   His functional capacity is excellent at 7.71 METs according to the [facility] Activity Status Index (DASI). Recommendations: According to ACC/AHA guidelines, no further cardiovascular testing needed.  The patient may proceed to surgery at acceptable risk.   Antiplatelet and/or Anticoagulation Recommendations: Per office protocol and pending no concerning symptoms at time of visit, he may hold aspirin for 5-7 days prior to procedure and should resume as soon as hemodynamically stable postoperatively.    The patient was advised that if he develops new symptoms prior to surgery to contact our office to arrange for a follow-up visit, and he verbalized understanding.  A copy of this note will be routed to requesting surgeon.  Time:   Today, I have spent 10 minutes with the patient with telehealth technology discussing medical history, symptoms, and management plan.    Alexander Milosevic Alexander Yanelie Abraha, NP  06/29/2024, 8:51 AM

## 2024-07-09 DIAGNOSIS — E119 Type 2 diabetes mellitus without complications: Secondary | ICD-10-CM | POA: Diagnosis not present

## 2024-07-09 DIAGNOSIS — I10 Essential (primary) hypertension: Secondary | ICD-10-CM | POA: Diagnosis not present

## 2024-07-09 DIAGNOSIS — Z01818 Encounter for other preprocedural examination: Secondary | ICD-10-CM | POA: Diagnosis not present

## 2024-07-13 ENCOUNTER — Encounter

## 2024-07-13 ENCOUNTER — Other Ambulatory Visit

## 2024-07-13 DIAGNOSIS — E119 Type 2 diabetes mellitus without complications: Secondary | ICD-10-CM | POA: Diagnosis not present

## 2024-07-13 DIAGNOSIS — I10 Essential (primary) hypertension: Secondary | ICD-10-CM | POA: Diagnosis not present

## 2024-07-18 ENCOUNTER — Ambulatory Visit: Admitting: Cardiology

## 2024-07-18 ENCOUNTER — Ambulatory Visit (HOSPITAL_BASED_OUTPATIENT_CLINIC_OR_DEPARTMENT_OTHER): Admitting: Nurse Practitioner

## 2024-07-18 ENCOUNTER — Encounter (HOSPITAL_BASED_OUTPATIENT_CLINIC_OR_DEPARTMENT_OTHER): Payer: Self-pay | Admitting: Nurse Practitioner

## 2024-07-18 VITALS — BP 122/72 | HR 73 | Ht 67.0 in | Wt 188.0 lb

## 2024-07-18 DIAGNOSIS — I77810 Thoracic aortic ectasia: Secondary | ICD-10-CM

## 2024-07-18 DIAGNOSIS — I5189 Other ill-defined heart diseases: Secondary | ICD-10-CM | POA: Diagnosis not present

## 2024-07-18 DIAGNOSIS — Z7189 Other specified counseling: Secondary | ICD-10-CM | POA: Diagnosis not present

## 2024-07-18 DIAGNOSIS — I10 Essential (primary) hypertension: Secondary | ICD-10-CM | POA: Diagnosis not present

## 2024-07-18 DIAGNOSIS — E785 Hyperlipidemia, unspecified: Secondary | ICD-10-CM | POA: Diagnosis not present

## 2024-07-18 DIAGNOSIS — I2510 Atherosclerotic heart disease of native coronary artery without angina pectoris: Secondary | ICD-10-CM

## 2024-07-18 NOTE — Progress Notes (Signed)
 Cardiology Office Note:  .   Date:  07/18/2024  ID:  Alexander Pollard, DOB 1951/01/04, MRN [id number] PCP: Regino Slater, MD  [facility] Providers Cardiologist:  Wilbert Bihari, MD Cardiology APP:  Percy Rosaline HERO, NP    Patient Profile: .      PMH Hypertension Hemochromatosis Mild dilatation of aortic root HFpEF Hyperlipidemia Elevated coronary artery calcium  score  CT calcium  score 08/29/2020 CAC score 83 (43rd percentile) Ascending aorta 40 mm Family history CAD  He underwent ETT 06/2015 due to atypical chest pain and had hypertensive response to exercise up to 195/107 prompting the addition of Toprol  XL.  Nuclear stress test 07/2015 was normal, EF 60%. Ambulatory BP monitor 07/2015 was normal.  2D echo 04/10/2018 with normal LV function, mild grade 1 diastolic dysfunction, and mildly dilated ascending aorta. CT calcium  score 08/29/2020 which revealed coronary artery calcium  score of 83 (43rd percentile). Was advised to start Crestor  5 mg and fish oil 4 g daily given coronary calcifications and low HDL. Echo 09/18/2021 revealed normal LVEF 55-60%, G1DD, mild AV sclerosis with no stenosis, mild dilatation of aortic root 40 mm and ascending aorta 40 mm  Has maintained consistent annual follow-up. At clinic visit with me 07/2022, he reported persistent cough for the previous several weeks following a respiratory infection.  Prior to respiratory symptoms, he was walking and biking 2-3 times per week.  He continued to work as a engineer, technical sales.  He reported frustration with elevated blood glucose readings despite normal A1c.  He had cut out processed food and sugar. BP was well controlled. LDL was 33 on 05/07/22.   At clinic visit with me on 08/08/2023, he expressed concern about flucutating glucose levels, despite maintaining a good A1c level for the past several years. A1C has never been > 5% dating back to 2014. Advised by PCP that he may be prediabetic and was referred to a nutritionist for  dietary advice. Made dietary changes, including cutting out soft drinks, sweet tea, and cookies, but acknowledges that further improvements could be made. He asks about discerning between age-related symptoms and cardiac symptoms. Active with walking and cycling and works as a engineer, technical sales. Notices some decrease in flexibility. 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 Weight is up 9lbs by our scale. BP has remained stable. No chest pain, shortness of breath, orthopnea, PND, edema, presyncope, syncope, palpitations. No bleeding concerns.        History of Present Illness: .   Discussed the use of AI scribe software for clinical note transcription with the patient, who gave verbal consent to proceed.  History of Present Illness Alexander Pollard is a very pleasant 73 year old male who is here today for follow-up of hyperlipidemia and mild coronary artery calcification. We discussed monitoring of aortic dilation, which has been stable. He is active, engaging in regular physical activity, including upper body exercises with dumbbells. No shortness of breath, orthopnea, paroxysmal nocturnal dyspnea, edema, chest discomfort, palpitations, presyncope or syncope. PVC noted on EKG, he is unaware. He is currently on metoprolol . Recent laboratory results from September show a hemoglobin A1c of 4.6%, LDL of 36, triglycerides of 96, and HDL of 29. He is planning to undergo right hip replacement and has undergone evaluations by PCP, cardiologist, and hematologist.  He is looking forward to having less hip pain and being able to complete more lower body exercises.  ROS: See HPI  Studies Reviewed: SABRA   EKG Interpretation Date/Time:  Wednesday July 18 2024 11:29:13 EST Ventricular Rate:  71 PR Interval:  166 QRS Duration:  74 QT Interval:  406 QTC Calculation: 441 R Axis:   -12  Text Interpretation: Sinus rhythm with occasional Premature ventricular  complexes Low voltage QRS When compared with ECG of 08-Aug-2023 14:44, Premature ventricular complexes are now Present PR interval has decreased Confirmed by Percy Browning [phone]) on 07/18/2024 11:32:20 AM    Risk Assessment/Calculations:             Physical Exam:   VS:  BP 122/72   Pulse 73   Ht 5' 7 (1.702 m)   Wt 188 lb (85.3 kg)   SpO2 95%   BMI 29.44 kg/m    Wt Readings from Last 3 Encounters:  07/18/24 188 lb (85.3 kg)  01/06/24 180 lb (81.6 kg)  08/08/23 188 lb 12.8 oz (85.6 kg)    GEN: Well nourished, well developed in no acute distress NECK: No JVD; No carotid bruits CARDIAC: RRR, no murmurs, rubs, gallops RESPIRATORY:  Clear to auscultation without rales, wheezing or rhonchi  ABDOMEN: Soft, non-tender, non-distended EXTREMITIES:  No edema; No deformity     ASSESSMENT AND PLAN: .    CAD without angina Cardiac risk CT calcium  score 08/2020 revealed CAC score of 83, 43rd percentile. Family history of heart diseaes. He remains active and exercises regularly and does not have chest pain, dyspnea, or other symptoms concerning for angina. EKG today reveals SR with occasional PVC.  No indication for further ischemic evaluation at this time.  - Continue aspirin, metoprolol , rosuvastatin  - Focus on secondary prevention including heart healthy mostly plant based diet avoiding saturated fat, processed foods, simple carbohydrates, and sugar along with aiming for at least 150 minutes of moderate intensity exercise each week  Preoperative cardiac evaluation Previously cleared by cardiology APP on 06/29/2024.  His functional capacity is excellent at 7.71 METS according to DASI and revised cardiac risk index is low for MACE. Advised he may hold aspirin 5 to 7 days prior to procedure and should resume as soon as hemodynamically stable postoperatively.  Hyperlipidemia LDL goal < 70  Lipid panel 05/20/2023 revealed total cholesterol 76, HDL 25, LDL 32, and triglycerides 90. We  discussed healthy diet options in the place of his current diet.  - Continue regular exercise along with heart healthy diet - Continue rosuvastatin   Hypertension BP is well controlled. Renal function stable on most recent lab work completed 12/2023. No change in anti-hypertensive therapy today. - Continue metoprolol    Diastolic dysfunction Aortic dilatation History of hemachromatosis Echo 09/18/2021 revealed normal LVEF 55 to 60%, G1 DD, normal RV, no significant valve disease. He appears euvolemic on exam, no concern for volume overload. No dyspnea, orthopnea, PND, or edema.  We are repeating echo in the setting of monitoring aortic root and ascending aortic dilatation. HR and BP are well controlled.  History of hemochromatosis with iron saturation levels stable per his report.  He gives blood routinely, but most recent phlebotomy was postponed due to upcoming hip surgery. He is asymptomatic with no change in activity tolerance, and no orthopnea, PND, edema.  - Aneurysm precautions provided - Evaluate echo for signs of heart failure secondary to hemochromatosis        Disposition: 1 year with Dr. Shlomo or me  Signed, Browning Percy, NP-C

## 2024-07-18 NOTE — Patient Instructions (Signed)
 Medication Instructions:   Your physician recommends that you continue on your current medications as directed. Please refer to the Current Medication list given to you today.   *If you need a refill on your cardiac medications before your next appointment, please call your pharmacy*  Lab Work:  None ordered.  If you have labs (blood work) drawn today and your tests are completely normal, you will receive your results only by: MyChart Message (if you have MyChart) OR A paper copy in the mail If you have any lab test that is abnormal or we need to change your treatment, we will call you to review the results.  Testing/Procedures:  Your physician has requested that you have an echocardiogram. Echocardiography is a painless test that uses sound waves to create images of your heart. It provides your doctor with information about the size and shape of your heart and how well your heart's chambers and valves are working. This procedure takes approximately one hour. There are no restrictions for this procedure. Please do NOT wear cologne, aftershave, or lotions (deodorant is allowed). Please arrive 15 minutes prior to your appointment time.   Follow-Up: At [facility], you and your health needs are our priority.  As part of our continuing mission to provide you with exceptional heart care, our providers are all part of one team.  This team includes your primary Cardiologist (physician) and Advanced Practice Providers or APPs (Physician Assistants and Nurse Practitioners) who all work together to provide you with the care you need, when you need it.  Your next appointment:   1 year(s)  Provider:   Wilbert Bihari, MD    We recommend signing up for the patient portal called MyChart.  Sign up information is provided on this After Visit Summary.  MyChart is used to connect with patients for Virtual Visits (Telemedicine).  Patients are able to view lab/test results, encounter notes,  upcoming appointments, etc.  Non-urgent messages can be sent to your provider as well.   To learn more about what you can do with MyChart, go to [email or web address].   Other Instructions  Your physician wants you to follow-up in: 1 year.  You will receive a reminder letter in the mail two months in advance. If you don't receive a letter, please call our office to schedule the follow-up appointment.   One of your tests has shown an aneurysm of your aorta. The word aneurysm refers to a bulge in an artery (blood vessel). Most people think of them in the context of an emergency, but yours was found incidentally. At this point there is nothing you need to do from a procedure standpoint, but there are some important things to keep in mind for day-to-day life.  Mainstays of therapy for aneurysms include very good blood pressure control, healthy lifestyle, and avoiding tobacco products and street drugs. Research has raised concern that antibiotics in the fluoroquinolone class could be associated with increased risk of having an aneurysm develop or tear. This includes medicines that end in floxacin, like Cipro or Levaquin. Make sure to discuss this information with other healthcare providers if you require antibiotics.  Since aneurysms can run in families, you should discuss your diagnosis with first degree relatives as they may need to be screened for this. Regular mild-moderate physical exercise is important, but avoid heavy lifting/weight lifting over 30lbs, chopping wood, shoveling snow or digging heavy earth with a shovel. It is best to avoid activities that cause grunting or straining (medically referred  to as a Valsalva maneuver). This happens when a person bears down against a closed throat to increase the strength of arm or abdominal muscles. There's often a tendency to do this when lifting heavy weights, doing sit-ups, push-ups or chin-ups, etc., but it may be harmful.  This is a finding I  would expect to be monitored periodically by your cardiology team. Most unruptured thoracic aortic aneurysms cause no symptoms, so they are often found during exams for other conditions. Contact a health care provider if you develop any discomfort in your upper back, neck, abdomen, trouble swallowing, cough or hoarseness, or unexplained weight loss. Get help right away if you develop severe pain in your upper back or abdomen that may move into your chest and arms, or any other concerning symptoms such as shortness of breath or fever. aorta

## 2024-07-27 DIAGNOSIS — I10 Essential (primary) hypertension: Secondary | ICD-10-CM | POA: Diagnosis not present

## 2024-07-27 DIAGNOSIS — E119 Type 2 diabetes mellitus without complications: Secondary | ICD-10-CM | POA: Diagnosis not present

## 2024-07-31 ENCOUNTER — Other Ambulatory Visit

## 2024-07-31 ENCOUNTER — Ambulatory Visit: Admitting: Hematology

## 2024-08-12 DIAGNOSIS — E119 Type 2 diabetes mellitus without complications: Secondary | ICD-10-CM | POA: Diagnosis not present

## 2024-08-12 DIAGNOSIS — I10 Essential (primary) hypertension: Secondary | ICD-10-CM | POA: Diagnosis not present

## 2024-08-14 ENCOUNTER — Ambulatory Visit (HOSPITAL_COMMUNITY)
Admission: RE | Admit: 2024-08-14 | Discharge: 2024-08-14 | Disposition: A | Discharge status: Home / Self Care | Source: Ambulatory Visit | Attending: Cardiology | Admitting: Cardiology

## 2024-08-14 ENCOUNTER — Ambulatory Visit (HOSPITAL_BASED_OUTPATIENT_CLINIC_OR_DEPARTMENT_OTHER): Payer: Self-pay | Admitting: Nurse Practitioner

## 2024-08-14 DIAGNOSIS — I10 Essential (primary) hypertension: Secondary | ICD-10-CM | POA: Diagnosis not present

## 2024-08-14 DIAGNOSIS — E785 Hyperlipidemia, unspecified: Secondary | ICD-10-CM | POA: Diagnosis not present

## 2024-08-14 DIAGNOSIS — I2510 Atherosclerotic heart disease of native coronary artery without angina pectoris: Secondary | ICD-10-CM | POA: Diagnosis not present

## 2024-08-14 LAB — ECHOCARDIOGRAM COMPLETE
AR max vel: 3.69 cm2
AV Area VTI: 3.76 cm2
AV Area mean vel: 3.61 cm2
AV Mean grad: 2 mmHg
AV Peak grad: 3.5 mmHg
Ao pk vel: 0.93 m/s
Area-P 1/2: 3.6 cm2
MV M vel: 0.95 m/s
MV Peak grad: 3.6 mmHg
S' Lateral: 3.1 cm

## 2024-08-15 ENCOUNTER — Other Ambulatory Visit: Payer: Self-pay | Admitting: Nurse Practitioner

## 2024-08-15 DIAGNOSIS — I10 Essential (primary) hypertension: Secondary | ICD-10-CM

## 2025-01-04 ENCOUNTER — Encounter

## 2025-01-04 ENCOUNTER — Ambulatory Visit: Admitting: Hematology

## 2025-01-04 ENCOUNTER — Other Ambulatory Visit

## 2025-02-22 ENCOUNTER — Ambulatory Visit: Admitting: Podiatry
